# Patient Record
Sex: Female | Born: 1945 | Race: Black or African American | Hispanic: No | Marital: Married | Smoking: Never smoker
Health system: Southern US, Community
[De-identification: ages and names within clinical notes are randomized; demographics above are authoritative.]

## PROBLEM LIST (undated history)

## (undated) DIAGNOSIS — J849 Interstitial pulmonary disease, unspecified: Secondary | ICD-10-CM

## (undated) DIAGNOSIS — I7300 Raynaud's syndrome without gangrene: Secondary | ICD-10-CM

## (undated) DIAGNOSIS — J189 Pneumonia, unspecified organism: Secondary | ICD-10-CM

## (undated) DIAGNOSIS — I739 Peripheral vascular disease, unspecified: Secondary | ICD-10-CM

## (undated) DIAGNOSIS — I10 Essential (primary) hypertension: Secondary | ICD-10-CM

## (undated) DIAGNOSIS — M359 Systemic involvement of connective tissue, unspecified: Secondary | ICD-10-CM

## (undated) DIAGNOSIS — E041 Nontoxic single thyroid nodule: Secondary | ICD-10-CM

## (undated) DIAGNOSIS — M069 Rheumatoid arthritis, unspecified: Secondary | ICD-10-CM

## (undated) DIAGNOSIS — C50919 Malignant neoplasm of unspecified site of unspecified female breast: Secondary | ICD-10-CM

## (undated) DIAGNOSIS — L509 Urticaria, unspecified: Secondary | ICD-10-CM

## (undated) HISTORY — PX: APPENDECTOMY: SHX54

## (undated) HISTORY — PX: BREAST SURGERY: SHX581

## (undated) HISTORY — PX: HERNIA REPAIR: SHX51

## (undated) HISTORY — PX: OTHER SURGICAL HISTORY: SHX169

---

## 1997-10-17 ENCOUNTER — Other Ambulatory Visit: Admission: RE | Admit: 1997-10-17 | Discharge: 1997-10-17 | Payer: Self-pay | Admitting: Obstetrics and Gynecology

## 1997-12-18 ENCOUNTER — Ambulatory Visit (HOSPITAL_COMMUNITY): Admission: RE | Admit: 1997-12-18 | Discharge: 1997-12-18 | Payer: Self-pay | Admitting: Obstetrics and Gynecology

## 1998-01-27 ENCOUNTER — Other Ambulatory Visit: Admission: RE | Admit: 1998-01-27 | Discharge: 1998-01-27 | Payer: Self-pay | Admitting: Obstetrics and Gynecology

## 1998-05-08 ENCOUNTER — Other Ambulatory Visit: Admission: RE | Admit: 1998-05-08 | Discharge: 1998-05-08 | Payer: Self-pay | Admitting: Obstetrics and Gynecology

## 1999-02-24 ENCOUNTER — Other Ambulatory Visit: Admission: RE | Admit: 1999-02-24 | Discharge: 1999-02-24 | Payer: Self-pay | Admitting: Obstetrics and Gynecology

## 1999-03-01 ENCOUNTER — Ambulatory Visit (HOSPITAL_COMMUNITY): Admission: RE | Admit: 1999-03-01 | Discharge: 1999-03-01 | Payer: Self-pay | Admitting: Obstetrics and Gynecology

## 1999-03-01 ENCOUNTER — Encounter: Payer: Self-pay | Admitting: Obstetrics and Gynecology

## 2000-03-16 ENCOUNTER — Ambulatory Visit (HOSPITAL_COMMUNITY): Admission: RE | Admit: 2000-03-16 | Discharge: 2000-03-16 | Payer: Self-pay | Admitting: Obstetrics and Gynecology

## 2000-03-16 ENCOUNTER — Encounter: Payer: Self-pay | Admitting: Obstetrics and Gynecology

## 2000-05-17 ENCOUNTER — Other Ambulatory Visit: Admission: RE | Admit: 2000-05-17 | Discharge: 2000-05-17 | Payer: Self-pay | Admitting: Obstetrics and Gynecology

## 2001-04-30 ENCOUNTER — Encounter: Payer: Self-pay | Admitting: Obstetrics and Gynecology

## 2001-04-30 ENCOUNTER — Ambulatory Visit (HOSPITAL_COMMUNITY): Admission: RE | Admit: 2001-04-30 | Discharge: 2001-04-30 | Payer: Self-pay | Admitting: Obstetrics and Gynecology

## 2001-12-12 ENCOUNTER — Other Ambulatory Visit: Admission: RE | Admit: 2001-12-12 | Discharge: 2001-12-12 | Payer: Self-pay | Admitting: Obstetrics and Gynecology

## 2002-12-16 ENCOUNTER — Other Ambulatory Visit: Admission: RE | Admit: 2002-12-16 | Discharge: 2002-12-16 | Payer: Self-pay | Admitting: Obstetrics and Gynecology

## 2006-10-02 ENCOUNTER — Ambulatory Visit: Payer: Self-pay | Admitting: Gastroenterology

## 2011-07-04 ENCOUNTER — Encounter: Payer: Self-pay | Admitting: Rheumatology

## 2011-07-27 ENCOUNTER — Encounter: Payer: Self-pay | Admitting: Rheumatology

## 2013-02-01 ENCOUNTER — Ambulatory Visit: Payer: Self-pay | Admitting: Gastroenterology

## 2013-07-01 ENCOUNTER — Ambulatory Visit: Payer: Self-pay | Admitting: Rheumatology

## 2013-07-17 ENCOUNTER — Ambulatory Visit: Payer: Self-pay | Admitting: Rheumatology

## 2014-07-28 ENCOUNTER — Ambulatory Visit: Payer: Medicare Other | Attending: Rheumatology | Admitting: Occupational Therapy

## 2014-07-28 DIAGNOSIS — M069 Rheumatoid arthritis, unspecified: Secondary | ICD-10-CM | POA: Diagnosis not present

## 2014-07-28 DIAGNOSIS — M79641 Pain in right hand: Secondary | ICD-10-CM | POA: Insufficient documentation

## 2014-07-28 DIAGNOSIS — M79642 Pain in left hand: Secondary | ICD-10-CM | POA: Insufficient documentation

## 2014-07-28 NOTE — Therapy (Signed)
[facility] [address] [place], [address] Phone: [phone]   Fax:  [phone]  Occupational Therapy Evaluation  Patient Details  Name: Eileen Maldonado MRN: [id number] Date of Birth: 13-Apr-1945 Referring Provider:  [name].,*  Encounter Date: 07/28/2014      OT End of Session - 07/28/14 1407    Visit Number 1   Number of Visits 4   Date for OT Re-Evaluation 08/25/14   OT Start Time 1045   OT Stop Time 1155   OT Time Calculation (min) 70 min   Activity Tolerance Patient tolerated treatment well      No past medical history on file.  No past surgical history on file.  There were no vitals filed for this visit.  Visit Diagnosis:  Rheumatoid arthritis - Plan: Ot plan of care cert/re-cert  Pain of right hand - Plan: Ot plan of care cert/re-cert  Pain in joint, hand, left - Plan: Ot plan of care cert/re-cert      Subjective Assessment - 07/28/14 1059    Subjective  Pt is referred to out-pt hand therapy today by Dr [name]. [name] for Eval and treatment. She reports that she has noted increased UD bilateral hands over last several years. Pt was seen by Dr [name] in March 2016. - had pain, dropping objects , L hand worse than R ,  and thumb bends backwards - pain in thumbs with gripping    Patient Stated Goals Increase strength in both hands, and limit further deviation as able. Pt also states that she would like to have a brace for her hands.   Currently in Pain? No/denies  Pt reports weakness but not pain at this time. Pt had pain prior to taking Prednisone   Multiple Pain Sites No           [facility] OT Assessment - 07/28/14 0001    Assessment   Diagnosis --  Rheumatoid Arthritis bilateral hands   Onset Date --  25 yrs per pt report   Assessment --  Pt reads, does housekeeping.    Home  Environment   Additional Comments --  Pt husband opening jars, picking up heavy objects.    Lives With Spouse  Husband of 50 years   ADL   Grooming Modified independent   Grooming Patient Percentage --  Pt reports difficulty w/ buttons, grasping, fine motor/coord   Right Hand AROM   R Thumb MCP 0-60 --  CMC -65-70*   R Thumb IP 0-80 --  hyp exten of 45 @ IP; 50   R Thumb Radial ABduction/ADduction 0-55 42   R Thumb Palmar ABduction/ADduction 0-45 48   R Index  MCP 0-90 95 Degrees   R Index PIP 0-100 100 Degrees   R Long  MCP 0-90 95 Degrees   R Long PIP 0-100 100 Degrees   R Ring  MCP 0-90 100 Degrees   R Ring PIP 0-100 100 Degrees   R Little  MCP 0-90 100 Degrees   R Little PIP 0-100 100 Degrees   Left Hand AROM   L Thumb MCP 0-60 --  CMC -50-80   L Thumb IP 0-80 --  IP hyp exten 50* and 50 flexion   L Thumb Radial ADduction/ABduction 0-55 40   L Thumb Palmar ADduction/ABduction 0-45 42   L Index  MCP 0-90 90 Degrees   L Index PIP 0-100 100 Degrees   L Long  MCP  0-90 90 Degrees   L Long PIP 0-100 100 Degrees   L Ring  MCP 0-90 95 Degrees   L Ring PIP 0-100 100 Degrees   L Little  MCP 0-90 95 Degrees   L Little PIP 0-100 85 Degrees   Hand Function   Right Hand Grip (lbs) --  35#   Right Hand Lateral Pinch --  12#   Right Hand 3 Point Pinch --  10#   Left Hand Grip (lbs) --  30#   Left Hand Lateral Pinch --  10#   Left 3 point pinch --  7#   Comment --  Wrist AROM WFL's bilaterally.         ssued Left hand neoprene CMC thumb splint for use at home with functional activity. Pt was educated in splint use, care and precautions and verbalized understanding in clinic today.  Issued handout and reviewed with pt; Joint Protection Techniques. Added techniques to home program and pt verbalized understanding in clinic today. Pt was educated to review handout at home and ask questions at next visit.  Issued handout on a/e for use during ADL's, ie: button hook, nail clipper, jar opener, spring loaded scissors, wide grip pen etc to assist in maximizing  independence for ADL's and daily activity.  Pt was also instructed in HEP for AROM: Tendon AROM lumbricals, hook fisting/intrinsic only right hand (not left). L hand intrinsic fist blocked   Radial deviation, tapping, opposition with flexion of thumb IP  pt to perform 5-8 reps each exercise.                  OT Education - 07/28/14 1406    Education provided Yes   Person(s) Educated Patient   Methods Explanation;Demonstration;Handout   Comprehension Returned demonstration;Verbalized understanding          OT Short Term Goals - 07/28/14 1414    OT SHORT TERM GOAL #1   Title Pt. will be indep. with HEP to increase/maintain bilateral thumb AROM , digits extention and flexion    Baseline Thumb IP hyper extention , MP kept in flexion - 5th digits decrease flexion at MP and PIP's    Time 2   Period Weeks   Status New   OT SHORT TERM GOAL #2   Title Pt. will have improvement with pain by 10-15 points and function by 10 points on PRWHE    Baseline Pain 35/50; function 32/50   Time 4   Period Weeks   Status New   OT SHORT TERM GOAL #3   Title Pt. will be indep. with wearing appropriate splints  to decrease  pain and increase use of thumb's/ decrease ulnar deviation    Baseline no splints to decrease ulnar deviation and decrease pain at thumbs    Time 3   Period Weeks   Status New           OT Long Term Goals - 07/28/14 1419    OT LONG TERM GOAL #1   Title Pt to verbalize 3 joint protection principles to use at home and 3 adaptive equipment to increase ease at home and decrease pain    Baseline no knowledge on AE   Time 4   Period Weeks   Status New               Plan - 07/28/14 1409    Clinical Impression Statement Pt present with diagnosis of RA in bilateral hands, increase ulnar deviation of digiits, hyper extention  of PIP's and flexion of DIP l decrease hyper extention of MC' , increase hyper extention of IP thumb and flexion of MP - increase pain  with funtional use , decrease grip ad prehension strenght - L more than R -   pt in ned for splinting , education of HEP , jont protection and  modifications    Pt will benefit from skilled therapeutic intervention in order to improve on the following deficits (Retired) Decreased knowledge of use of DME;Impaired flexibility;Decreased strength;Impaired UE functional use;Other (comment)   Rehab Potential Good   OT Frequency 1x / week   OT Duration 4 weeks   OT Treatment/Interventions Parrafin;Therapeutic exercise;DME and/or AE instruction;Splinting;Other (comment)   Plan joint protection principles   OT Home Exercise Plan splinting - pt to bring in old splint or picture she seen prior to me ordeing anti ulnar deviation splint           G-Codes - Aug 27, 2014 1422    Functional Assessment Tool Used Grip , prehension strenght, ROM , pain and clinical judgement   Functional Limitation Self care   Self Care Current Status (U5750) At least 20 percent but less than 40 percent impaired, limited or restricted   Self Care Goal Status (N1833) At least 1 percent but less than 20 percent impaired, limited or restricted      Problem List Patient Active Problem List   Diagnosis Date Noted  . Rheumatoid arthritis 08/27/14    Rosalyn Gess 08/27/14, 2:37 PM  [facility] [address], [place], [address] Phone: [phone]   Fax:  [phone]

## 2014-07-28 NOTE — Patient Instructions (Addendum)
Outcome Measure: Patient Rated Wrist/Hand Evaluation Pain= 35/50     Function= 32/50  Issued Left hand neoprene CMC thumb splint for use at home with functional activity. Pt was educated in splint use, care and precautions and verbalized understanding in clinic today.  Issued handout and reviewed with pt; Joint Protection Techniques. Added techniques to home program and pt verbalized understanding in clinic today. Pt was educated to review handout at home and ask questions at next visit.  Issued handout on a/e for use during ADL's, ie: button hook, nail clipper, jar opener, spring loaded scissors, wide grip pen etc to assist in maximizing independence for ADL's and daily activity.  Pt was also instructed in HEP for AROM: Tendon AROM lumbricals, hook fisting/intrinsic only right hand (not left). L hand intrinsic fist blocked   Radial deviation, tapping, opposition with flexion of thumb IP  pt to perform 5-8 reps each exercise.

## 2014-07-30 ENCOUNTER — Encounter: Payer: Self-pay | Admitting: Occupational Therapy

## 2014-08-04 ENCOUNTER — Ambulatory Visit: Payer: Medicare Other | Admitting: Occupational Therapy

## 2014-08-04 ENCOUNTER — Encounter: Payer: Self-pay | Admitting: Occupational Therapy

## 2014-08-04 DIAGNOSIS — M069 Rheumatoid arthritis, unspecified: Secondary | ICD-10-CM

## 2014-08-04 DIAGNOSIS — M79641 Pain in right hand: Secondary | ICD-10-CM

## 2014-08-04 DIAGNOSIS — M79642 Pain in left hand: Secondary | ICD-10-CM

## 2014-08-04 NOTE — Therapy (Signed)
[facility] [address] [place], [address] Phone: [phone]   Fax:  [phone]  Occupational Therapy Treatment  Patient Details  Name: Eileen Maldonado MRN: [id number] Date of Birth: May 25, 1945 Referring Provider:  [place] Pink, MD  Encounter Date: 08/04/2014      OT End of Session - 08/04/14 1354    Visit Number 2   Number of Visits 4   Date for OT Re-Evaluation 08/25/14   OT Start Time 1308   OT Stop Time 1350   OT Time Calculation (min) 42 min      No past medical history on file.  No past surgical history on file.  There were no vitals filed for this visit.  Visit Diagnosis:  Rheumatoid arthritis  Pain of right hand  Pain in joint, hand, left      Subjective Assessment - 08/04/14 1349    Subjective  Doing okay - just bad day - my power is out and all my papers and splint in house and I cannot get in house - no pain just still  need to help my pinkie on L and fingers on R to go straight after fist    Patient Stated Goals Increase strength in both hands, and limit further deviation as able. Pt also states that she would like to have a brace for her hands.   Currently in Pain? No/denies                      OT Treatments/Exercises (OP) - 08/04/14 0001    Exercises   Exercises Hand   Hand Exercises   Other Hand Exercises Tapping of digiits on bilateal hands but was hyper extendeing out of PIP's - fitted with Oval 8 splints on wnd thru 4th's to correct, also RD of digits done - use paper to slide under each , IP thumb blocked AROM    Splinting   Splinting Assess size of Alimed anti ulnar deviation splints for bilateral hands - measure meduim size - Oval 's 8 fitted on 2nd thur 4th digiits - size 7 and size 8 on R wnd and 3rd                 OT Education - 08/04/14 1354    Education provided Yes   Education Details see pt instruction   Person(s) Educated  Patient   Methods Explanation;Demonstration;Verbal cues;Handout   Comprehension Verbalized understanding;Verbal cues required          OT Short Term Goals - 07/28/14 1414    OT SHORT TERM GOAL #1   Title Pt. will be indep. with HEP to increase/maintain bilateral thumb AROM , digits extention and flexion    Baseline Thumb IP hyper extention , MP kept in flexion - 5th digits decrease flexion at MP and PIP's    Time 2   Period Weeks   Status New   OT SHORT TERM GOAL #2   Title Pt. will have improvement with pain by 10-15 points and function by 10 points on PRWHE    Baseline Pain 35/50; function 32/50   Time 4   Period Weeks   Status New   OT SHORT TERM GOAL #3   Title Pt. will be indep. with wearing appropriate splints  to decrease  pain and increase use of thumb's/ decrease ulnar deviation    Baseline no splints to decrease ulnar deviation and decrease pain at thumbs  Time 3   Period Weeks   Status New           OT Long Term Goals - 07/28/14 1419    OT LONG TERM GOAL #1   Title Pt to verbalize 3 joint protection principles to use at home and 3 adaptive equipment to increase ease at home and decrease pain    Baseline no knowledge on AE   Time 4   Period Weeks   Status New               Plan - 08/04/14 1355    Clinical Impression Statement Pt MC ulnar deviastion limiting her extention of MC - out of fist - pt to only do extention and RD drom midline - and use oval 8 splnts to decrease hyper extenion of PIP's of 2nd thur 4th - CMC neoprene splint fitted for R hand - pt report increase support on the L with neopren splint    Pt will benefit from skilled therapeutic intervention in order to improve on the following deficits (Retired) Decreased knowledge of use of DME;Impaired flexibility;Decreased strength;Impaired UE functional use;Other (comment)   Rehab Potential Good   OT Frequency 1x / week   OT Duration 4 weeks   OT Treatment/Interventions Parrafin;Therapeutic  exercise;DME and/or AE instruction;Splinting;Other (comment)   OT Home Exercise Plan see pt instruction    Consulted and Agree with Plan of Care Patient        Problem List Patient Active Problem List   Diagnosis Date Noted  . Rheumatoid arthritis 07/28/2014    Rosalyn Gess OTR/L, CLT 08/04/2014, 1:59 PM  [facility] [address] [place], [address] Phone: [phone]   Fax:  [phone]

## 2014-08-04 NOTE — Patient Instructions (Signed)
Pt instructed on OVal 8 splint use during tapping /extnetion of digits but they need to be alinged straigth   RD of all digits - slide on paper - from midline into RD   Thumb IP flexion blocked

## 2014-08-07 ENCOUNTER — Encounter: Payer: Self-pay | Admitting: Occupational Therapy

## 2014-08-07 ENCOUNTER — Encounter: Payer: Medicare Other | Admitting: Occupational Therapy

## 2014-08-11 ENCOUNTER — Encounter: Payer: Self-pay | Admitting: Occupational Therapy

## 2014-08-13 ENCOUNTER — Encounter: Payer: Self-pay | Admitting: Occupational Therapy

## 2014-08-14 ENCOUNTER — Encounter: Admitting: Occupational Therapy

## 2014-08-18 ENCOUNTER — Encounter: Payer: Self-pay | Admitting: Occupational Therapy

## 2014-08-19 ENCOUNTER — Ambulatory Visit: Payer: Medicare Other | Admitting: Occupational Therapy

## 2014-08-19 DIAGNOSIS — M79641 Pain in right hand: Secondary | ICD-10-CM | POA: Diagnosis not present

## 2014-08-19 DIAGNOSIS — M79642 Pain in left hand: Secondary | ICD-10-CM

## 2014-08-19 DIAGNOSIS — M069 Rheumatoid arthritis, unspecified: Secondary | ICD-10-CM

## 2014-08-19 NOTE — Therapy (Signed)
[facility] [address], [place], [address] Phone: [phone]   Fax:  [phone]  Occupational Therapy Treatment  Patient Details  Name: Eileen Maldonado MRN: [id number] Date of Birth: [birth year] Referring Provider:  [name].,*  Encounter Date: 08/19/2014      OT End of Session - 08/19/14 1559    Visit Number 3   Number of Visits 4   Date for OT Re-Evaluation 08/25/14   OT Start Time 1326   OT Stop Time 1354   OT Time Calculation (min) 28 min   Activity Tolerance Patient tolerated treatment well   Behavior During Therapy [facility] for tasks assessed/performed      No past medical history on file.  No past surgical history on file.  There were no vitals filed for this visit.  Visit Diagnosis:  Rheumatoid arthritis  Pain of right hand  Pain in joint, hand, left      Subjective Assessment - 08/19/14 1549    Subjective  Doing okay - just bad day - my power is out and all my papers and splint in house and I cannot get in house - no pain just still  need to help my pinkie on L and fingers on R to go straight after fist    Patient Stated Goals Increase strength in both hands, and limit further deviation as able. Pt also states that she would like to have a brace for her hands.   Currently in Pain? No/denies                      OT Treatments/Exercises (OP) - 08/19/14 0001    Hand Exercises   Other Hand Exercises Assess fisitng and opening of digits in splint - R digits  - but L hand deviation is 60 degrees out of 5th digits and extention of MC -60 degrees - with splint on deviation decrease to 28 degrees and extention to about -30 degrees - able to hold full extention with place and hold    Splinting   Splinting Pt was fitted with bilateral  Hinged ulnar deviation splints - R hand fitted very well and very litte discomfort and able to open hand - but L hand needed some  adjustements to loops under digits and fastening wrist strap correctly from thumb to ulnar side - mold skin applied on bar on dorsal proximal phalanges                  OT Education - 08/19/14 1558    Education provided Yes   Education Details see pt instruction    Person(s) Educated Patient   Methods Explanation;Demonstration;Tactile cues   Comprehension Verbalized understanding;Returned demonstration;Verbal cues required          OT Short Term Goals - 08/19/14 1602    OT SHORT TERM GOAL #1   Title Pt. will be indep. with HEP to increase/maintain bilateral thumb AROM , digits extention and flexion    Baseline Thumb IP hyper extention , MP kept in flexion - 5th digits decrease flexion at MP and PIP's    Time 2   Period Weeks   Status On-going   OT SHORT TERM GOAL #2   Title Pt. will have improvement with pain by 10-15 points and function by 10 points on PRWHE    Baseline Pain 35/50; function 32/50   Time 2   Period Weeks   Status On-going  OT SHORT TERM GOAL #3   Title Pt. will be indep. with wearing appropriate splints  to decrease  pain and increase use of thumb's/ decrease ulnar deviation    Time 3   Period Weeks   Status On-going           OT Long Term Goals - 08/19/14 1602    OT LONG TERM GOAL #1   Title Pt to verbalize 3 joint protection principles to use at home and 3 adaptive equipment to increase ease at home and decrease pain    Status Achieved               Plan - 08/19/14 1600    Clinical Impression Statement Pt's ulnar deviation and extention of MC's worse on the L hand - pt do had some irritation between webspaces - pt to slowly increase wearing of splints - no issues with R hand but need to increase gradually on the L - adjusted it several times and assess  deviation that decrease after adjustment - not  normal but did improve it about 50%   Pt will benefit from skilled therapeutic intervention in order to improve on the following  deficits (Retired) Decreased knowledge of use of DME;Impaired flexibility;Decreased strength;Impaired UE functional use;Other (comment)   Rehab Potential Good   OT Frequency 1x / week   OT Duration 2 weeks   OT Treatment/Interventions Parrafin;Therapeutic exercise;DME and/or AE instruction;Splinting;Other (comment)   OT Home Exercise Plan see pt instruction    Consulted and Agree with Plan of Care Patient        Problem List Patient Active Problem List   Diagnosis Date Noted  . Rheumatoid arthritis 07/28/2014    Rosalyn Gess  OTR/L, CLT 08/19/2014, 4:04 PM  [facility] [address] [place], [address] Phone: [phone]   Fax:  [phone]

## 2014-08-19 NOTE — Patient Instructions (Signed)
Hinged ulnar deviation splint - to start with 30 min and increase if no issues to 45 min, then hour and so on  BUT if issues keep it at 30 min - 45 min   And R hand can maybe increase faster and L hand maybe need to stay about 30 to 45 min - because worse and relying more on splint for correction  To wear CMC neoprene splint with activities that will increase thumb pain - to prevent pain

## 2014-08-21 ENCOUNTER — Encounter: Payer: Self-pay | Admitting: Occupational Therapy

## 2014-08-21 ENCOUNTER — Encounter: Admitting: Occupational Therapy

## 2014-08-28 ENCOUNTER — Ambulatory Visit: Admitting: Occupational Therapy

## 2014-09-03 ENCOUNTER — Inpatient Hospital Stay: Payer: Medicare Other | Admitting: Oncology

## 2014-09-03 ENCOUNTER — Ambulatory Visit: Payer: Medicare Other | Attending: Rheumatology | Admitting: Occupational Therapy

## 2014-09-03 DIAGNOSIS — M79641 Pain in right hand: Secondary | ICD-10-CM | POA: Insufficient documentation

## 2014-09-03 DIAGNOSIS — M79642 Pain in left hand: Secondary | ICD-10-CM | POA: Diagnosis present

## 2014-09-03 DIAGNOSIS — M069 Rheumatoid arthritis, unspecified: Secondary | ICD-10-CM | POA: Insufficient documentation

## 2014-09-03 NOTE — Patient Instructions (Signed)
Pt educated on wearing of 2 types of anti ulnar deviation splints for daytime and night time use   Precautions address and pt in agreement - can get for night time soft one strap to achor it around the wrist if sliding off

## 2014-09-03 NOTE — Therapy (Signed)
[facility] [address], [place], [address] Phone: [phone]   Fax:  [phone]  Occupational Therapy Treatment  Patient Details  Name: Eileen Maldonado MRN: [id number] Date of Birth: 12-May-1945 Referring Provider:  [name].,*  Encounter Date: 09/03/2014      OT End of Session - 09/03/14 1405    Visit Number 4   Number of Visits 6   Date for OT Re-Evaluation 09/17/14   OT Start Time 1312   OT Stop Time 1345   OT Time Calculation (min) 33 min   Activity Tolerance Patient tolerated treatment well   Behavior During Therapy [facility] for tasks assessed/performed      No past medical history on file.  No past surgical history on file.  There were no vitals filed for this visit.  Visit Diagnosis:  Rheumatoid arthritis - Plan: OT PLAN OF CARE CERT/RE-CERT  Pain of right hand - Plan: OT PLAN OF CARE CERT/RE-CERT  Pain in joint, hand, left - Plan: OT PLAN OF CARE CERT/RE-CERT      Subjective Assessment - 09/03/14 1313    Subjective  I like the black splints for the thumb - pain feels better - I could not figure out what was L and R for those deviation splints - I woke up this am and my L thumb was out to the sdie - I brought the soft old splints for you to check please    Patient Stated Goals Increase strength in both hands, and limit further deviation as able. Pt also states that she would like to have a brace for her hands.   Currently in Pain? Yes   Pain Score 3    Pain Location Hand   Pain Orientation Right;Left   Pain Descriptors / Indicators Aching   Pain Type Chronic pain   Pain Onset Other (comment)   Pain Frequency Intermittent                      OT Treatments/Exercises (OP) - 09/03/14 0001    Hand Exercises   Other Hand Exercises pt UD is with daytime antideviation splints decrease more than 50% ; pt is unable to extend without help 3rd thru 5th L digits at  [facility] - but can maintain it -    Splinting   Splinting Assess Alimed anti ulnar splints this date - and marked L and R for pt - to wear about 45 min during day at time - then assess her old soft anti ulnar deviation splints - she needed fitting and education of how to donn and wear them correctly - she was puttying on the light and straps for each digits was wrong place and over each other L and R - CMC thumb neoprene splints great fit and use - decreaes pain at thumbs per pt during daytime use                 OT Education - 09/03/14 1405    Education provided Yes   Education Details pt instruction   Person(s) Educated Patient   Methods Explanation;Demonstration;Tactile cues;Verbal cues   Comprehension Verbalized understanding;Returned demonstration;Verbal cues required          OT Short Term Goals - 09/03/14 1339    OT SHORT TERM GOAL #1   Title Pt. will be indep. with HEP to increase/maintain bilateral thumb AROM , digits extention and flexion    Time 2  Status Achieved   OT SHORT TERM GOAL #2   Title Pt. will have improvement with pain by 10-15 points and function by 10 points on PRWHE    Status Not Met   OT SHORT TERM GOAL #3   Title Pt. will be indep. with wearing appropriate splints  to decrease  pain and increase use of thumb's/ decrease ulnar deviation    Time 2   Period Weeks   Status On-going           OT Long Term Goals - 09/03/14 1359    OT LONG TERM GOAL #1   Title Pt to verbalize 3 joint protection principles to use at home and 3 adaptive equipment to increase ease at home and decrease pain    Status Achieved               Plan - 09/03/14 1407    Clinical Impression Statement Pt was donning and wearing soft ulnar deviation splints wrong in past - was educated and fitted this date to use at night time - and then reviewed daytime ones with hinges - pt demo correctly with min vc - she do request that she ones to come back one more time to make sure  she is weearing it corretly - explain for pt  splints will not make L hand better it is about preventing it from getting worse in L and  R hand   Pt will benefit from skilled therapeutic intervention in order to improve on the following deficits (Retired) Decreased knowledge of use of DME;Impaired flexibility;Decreased strength;Impaired UE functional use;Other (comment)   Rehab Potential Good   OT Frequency 1x / week   OT Duration 2 weeks   OT Treatment/Interventions Parrafin;Therapeutic exercise;DME and/or AE instruction;Splinting;Other (comment)   OT Home Exercise Plan see pt instruction    Consulted and Agree with Plan of Care Patient        Problem List Patient Active Problem List   Diagnosis Date Noted  . Rheumatoid arthritis 07/28/2014    Rosalyn Gess OTR/L,CLT 09/03/2014, 2:12 PM  [facility] [address], [place], [address] Phone: [phone]   Fax:  [phone]

## 2014-09-10 ENCOUNTER — Ambulatory Visit: Payer: Medicare Other | Admitting: Occupational Therapy

## 2014-09-10 DIAGNOSIS — M069 Rheumatoid arthritis, unspecified: Secondary | ICD-10-CM | POA: Diagnosis not present

## 2014-09-10 DIAGNOSIS — M79642 Pain in left hand: Secondary | ICD-10-CM

## 2014-09-10 DIAGNOSIS — M79641 Pain in right hand: Secondary | ICD-10-CM

## 2014-09-10 NOTE — Patient Instructions (Signed)
Reviewed again donning and doffing as well as wearing of splints

## 2014-09-10 NOTE — Therapy (Signed)
[facility] [address] [place], [address] Phone: [phone]   Fax:  [phone]  Occupational Therapy Treatment  Patient Details  Name: Eileen Maldonado MRN: [id number] Date of Birth: 1945/10/04 Referring Provider:  [name].,*  Encounter Date: 09/10/2014      OT End of Session - 09/10/14 1719    Visit Number 5   Number of Visits 6   Date for OT Re-Evaluation 09/17/14   OT Start Time 1500   OT Stop Time 1533   OT Time Calculation (min) 33 min   Activity Tolerance Patient tolerated treatment well   Behavior During Therapy [facility] for tasks assessed/performed      No past medical history on file.  No past surgical history on file.  There were no vitals filed for this visit.  Visit Diagnosis:  Rheumatoid arthritis  Pain of right hand  Pain in joint, hand, left      Subjective Assessment - 09/10/14 1710    Subjective  I wear the soft splints in night time , hard ones during day for short periods of time - thumb ones when using my hands a lot -I hope the the new medicine get approve to help for the pain and swelling over my knuckles   Patient Stated Goals Increase strength in both hands, and limit further deviation as able. Pt also states that she would like to have a brace for her hands.   Currently in Pain? Yes   Pain Score 3    Pain Location Hand   Pain Orientation Right;Left   Pain Descriptors / Indicators Aching   Pain Type Chronic pain   Pain Onset Other (comment)            [facility] OT Assessment - 09/10/14 0001    Strength   Right Hand Grip (lbs) 25   Right Hand Lateral Pinch 13 lbs   Right Hand 3 Point Pinch 10 lbs   Left Hand Grip (lbs) 26   Left Hand Lateral Pinch 10 lbs   Left Hand 3 Point Pinch 7 lbs                  OT Treatments/Exercises (OP) - 09/10/14 0001    Hand Exercises   Other Hand Exercises RD of digits , fisting - but pt to do place  and hold, and able to correct into RD from UD    Splinting   Splinting Pt able to donn [place] anti ulnar splints on this date , and soft ones needed min A - Neoprene [facility] splints Ind                OT Education - 09/10/14 1719    Education provided Yes   Education Details splints   Person(s) Educated Patient   Methods Explanation;Demonstration;Tactile cues   Comprehension Verbalized understanding;Returned demonstration;Verbal cues required          OT Short Term Goals - 09/10/14 1720    OT SHORT TERM GOAL #1   Title Pt. will be indep. with HEP to increase/maintain bilateral thumb AROM , digits extention and flexion    Status Achieved   OT SHORT TERM GOAL #2   Title Pt. will have improvement with pain by 10-15 points and function by 10 points on PRWHE    Status Not Met   OT SHORT TERM GOAL #3   Title Pt. will be indep. with wearing appropriate splints  to decrease  pain and increase use of thumb's/ decrease ulnar deviation    Status Achieved           OT Long Term Goals - 09-Oct-2014 1721    OT LONG TERM GOAL #1   Title Pt to verbalize 3 joint protection principles to use at home and 3 adaptive equipment to increase ease at home and decrease pain    Status Achieved               Plan - Oct 09, 2014 1721    Clinical Impression Statement Pt maintain grip 25 and 26 lbs bilateral - and prehension still 7-10 lbs - pt fitted with anti ulnar deviation splints to wear and prevent R hand getting worse and L hand maintain ROM and function - pt independent in HEP and wearing of splints as well as joint protection prinscipleas    Pt will benefit from skilled therapeutic intervention in order to improve on the following deficits (Retired) Decreased knowledge of use of DME;Impaired flexibility;Decreased strength;Impaired UE functional use;Other (comment)   Rehab Potential Good   OT Treatment/Interventions [place];Therapeutic exercise;DME and/or AE instruction;Splinting;Other  (comment)   OT Home Exercise Plan splints wearing    Consulted and Agree with Plan of Care Patient          G-Codes - 2014-10-09 1724    Functional Assessment Tool Used Grip and prehenison , ROM - splint use    Functional Limitation Self care   Self Care Current Status (R5615) At least 1 percent but less than 20 percent impaired, limited or restricted   Self Care Goal Status (P7943) At least 1 percent but less than 20 percent impaired, limited or restricted      Problem List Patient Active Problem List   Diagnosis Date Noted  . Rheumatoid arthritis 07/28/2014    Rosalyn Gess OTR/L, CLT 10-09-2014, 5:26 PM  [facility] [address], [place], [address] Phone: [phone]   Fax:  [phone]

## 2015-07-09 ENCOUNTER — Ambulatory Visit: Payer: Medicare Other | Attending: Rheumatology | Admitting: Occupational Therapy

## 2015-07-09 DIAGNOSIS — M25642 Stiffness of left hand, not elsewhere classified: Secondary | ICD-10-CM | POA: Diagnosis present

## 2015-07-09 DIAGNOSIS — M25641 Stiffness of right hand, not elsewhere classified: Secondary | ICD-10-CM | POA: Insufficient documentation

## 2015-07-09 NOTE — Therapy (Signed)
[facility] [address] [place], [address] Phone: [phone]   Fax:  [phone]  Occupational Therapy Treatment  Patient Details  Name: Eileen Maldonado MRN: [id number] Date of Birth: 1945/11/23 Referring Provider: [name]  Encounter Date: 07/09/2015      OT End of Session - 07/09/15 1626    Visit Number 1   Number of Visits 1   Date for OT Re-Evaluation 07/09/15   OT Start Time 1301   OT Stop Time 1357   OT Time Calculation (min) 56 min   Activity Tolerance Patient tolerated treatment well   Behavior During Therapy [facility] for tasks assessed/performed      No past medical history on file.  No past surgical history on file.  There were no vitals filed for this visit.      Subjective Assessment - 07/09/15 1310    Subjective  Wanted to come and see you how my hands doing - compare to last year ago - I started during winter compression gloves during day and that is helping - still splints    Patient Stated Goals Want you to compare to last year my strength and ROM    Currently in Pain? No/denies            [facility] OT Assessment - 07/09/15 0001    Assessment   Diagnosis Bilateral hand pain    Referring Provider kernodle   Onset Date 06/23/15   Assessment Pt was seen year ago - and wanted reassessment to compare if she did not loose any ROM or strength    Prior Therapy --  last year May /June    Balance Screen   Has the patient fallen in the past 6 months No   Has the patient had a decrease in activity level because of a fear of falling?  No   Is the patient reluctant to leave their home because of a fear of falling?  No   Home  Environment   Additional Comments --  Husband still pick up heavy objects or open jars    Lives With Spouse   Prior Function   Leisure House work , cooking, keeping grandson - 7 months (2 days ago) Read, word search , games on table    Strength   Right Hand  Grip (lbs) 40   Right Hand Lateral Pinch 16 lbs   Right Hand 3 Point Pinch 10 lbs   Left Hand Grip (lbs) 31   Left Hand Lateral Pinch 13 lbs   Left Hand 3 Point Pinch 10 lbs   Right Hand AROM   R Thumb MCP 0-60 65 Degrees   R Thumb IP 0-80 --  55 flexion , 50 rest hyper extention    R Index  MCP 0-90 90 Degrees   R Index PIP 0-100 100 Degrees   R Long  MCP 0-90 90 Degrees   R Long PIP 0-100 100 Degrees   R Ring  MCP 0-90 95 Degrees   R Ring PIP 0-100 100 Degrees   R Little  MCP 0-90 100 Degrees   R Little PIP 0-100 75 Degrees   Left Hand AROM   L Thumb MCP 0-60 --  76 flexion   L Thumb IP 0-80 --  50 flexion , hyperext 60    L Index  MCP 0-90 90 Degrees   L Index PIP 0-100 100 Degrees   L Long  MCP 0-90 95 Degrees  L Long PIP 0-100 100 Degrees   L Ring  MCP 0-90 90 Degrees   L Ring PIP 0-100 100 Degrees   L Little  MCP 0-90 95 Degrees   L Little PIP 0-100 90 Degrees                          OT Education - 07/09/15 1626    Education provided Yes   Education Details HEP    Person(s) Educated Patient   Methods Explanation;Demonstration;Tactile cues;Verbal cues   Comprehension Returned demonstration;Verbalized understanding          OT Short Term Goals - 09/10/14 1720    OT SHORT TERM GOAL #1   Title Pt. will be indep. with HEP to increase/maintain bilateral thumb AROM , digits extention and flexion    Status Achieved   OT SHORT TERM GOAL #2   Title Pt. will have improvement with pain by 10-15 points and function by 10 points on PRWHE    Status Not Met   OT SHORT TERM GOAL #3   Title Pt. will be indep. with wearing appropriate splints  to decrease  pain and increase use of thumb's/ decrease ulnar deviation    Status Achieved           OT Long Term Goals - 07/09/15 1631    OT LONG TERM GOAL #1   Title Pt verbalize understanding for homeprogram to cont with    Baseline met - able to demo   Status Achieved               Plan -  07/09/15 1627    Clinical Impression Statement Pt was refer by DR [name] - pt was seen by this OT year ago - and pt wanted reassessment and compare to last year - if she maintain her ROM and strength - pt AROM same cont to have some subluxation of tendons and digits ulnar deviat at rest but can correct them in full extentoin actively - pt has anti ulnar deviation splints to wear at home - but then her prehension strenght improve  with 3 lbs each except  3 point on the R - and then grip  improve from 25 to 40 lbs R, L improve 26 to 31 lbs - pt is now on Enbrel that she was not  on last year - pt to cont with home program     Plan cont with homeprogram    Consulted and Agree with Plan of Care Patient      Patient will benefit from skilled therapeutic intervention in order to improve the following deficits and impairments:     Visit Diagnosis: Stiffness of right hand, not elsewhere classified - Plan: Ot plan of care cert/re-cert  Stiffness of left hand, not elsewhere classified - Plan: Ot plan of care cert/re-cert    Problem List Patient Active Problem List   Diagnosis Date Noted  . Rheumatoid arthritis ([place]) 07/28/2014    Rosalyn Gess OTR/L,CLT  07/09/2015, 4:35 PM  [place] [facility] [address] [place], [address] Phone: [phone]   Fax:  [phone]  Name: Eileen Maldonado MRN: [id number] Date of Birth: October 18, 1945

## 2015-07-09 NOTE — Patient Instructions (Signed)
Pt cont with same HEP that she did last year- RD of digits  tapping into extention , opposition   And then Joint protection principles and AE trng

## 2016-01-14 ENCOUNTER — Other Ambulatory Visit
Admission: RE | Admit: 2016-01-14 | Discharge: 2016-01-14 | Disposition: A | Discharge status: Home / Self Care | Payer: Medicare Other | Source: Ambulatory Visit | Attending: Gastroenterology | Admitting: Gastroenterology

## 2016-01-14 DIAGNOSIS — R197 Diarrhea, unspecified: Secondary | ICD-10-CM | POA: Insufficient documentation

## 2016-01-15 LAB — GASTROINTESTINAL PANEL BY PCR, STOOL (REPLACES STOOL CULTURE)
Adenovirus F40/41: NOT DETECTED
Astrovirus: NOT DETECTED
CRYPTOSPORIDIUM: NOT DETECTED
Campylobacter species: NOT DETECTED
Cyclospora cayetanensis: NOT DETECTED
Entamoeba histolytica: NOT DETECTED
Enteroaggregative E coli (EAEC): DETECTED — AB
Enteropathogenic E coli (EPEC): NOT DETECTED
Enterotoxigenic E coli (ETEC): NOT DETECTED
Giardia lamblia: NOT DETECTED
NOROVIRUS GI/GII: NOT DETECTED
Plesimonas shigelloides: NOT DETECTED
ROTAVIRUS A: NOT DETECTED
SALMONELLA SPECIES: NOT DETECTED
SAPOVIRUS (I, II, IV, AND V): NOT DETECTED
SHIGA LIKE TOXIN PRODUCING E COLI (STEC): NOT DETECTED
SHIGELLA/ENTEROINVASIVE E COLI (EIEC): NOT DETECTED
Vibrio cholerae: NOT DETECTED
Vibrio species: NOT DETECTED
YERSINIA ENTEROCOLITICA: NOT DETECTED

## 2016-01-31 ENCOUNTER — Encounter: Payer: Self-pay | Admitting: Emergency Medicine

## 2016-01-31 ENCOUNTER — Inpatient Hospital Stay
Admission: EM | Admit: 2016-01-31 | Discharge: 2016-02-01 | DRG: 392 | Disposition: A | Discharge status: Home / Self Care | Payer: Medicare Other | Attending: Internal Medicine | Admitting: Internal Medicine

## 2016-01-31 ENCOUNTER — Emergency Department: Payer: Medicare Other

## 2016-01-31 DIAGNOSIS — M069 Rheumatoid arthritis, unspecified: Secondary | ICD-10-CM | POA: Diagnosis present

## 2016-01-31 DIAGNOSIS — Z823 Family history of stroke: Secondary | ICD-10-CM | POA: Diagnosis not present

## 2016-01-31 DIAGNOSIS — Z7952 Long term (current) use of systemic steroids: Secondary | ICD-10-CM | POA: Diagnosis not present

## 2016-01-31 DIAGNOSIS — R112 Nausea with vomiting, unspecified: Secondary | ICD-10-CM

## 2016-01-31 DIAGNOSIS — Z79899 Other long term (current) drug therapy: Secondary | ICD-10-CM | POA: Diagnosis not present

## 2016-01-31 DIAGNOSIS — Z803 Family history of malignant neoplasm of breast: Secondary | ICD-10-CM | POA: Diagnosis not present

## 2016-01-31 DIAGNOSIS — I10 Essential (primary) hypertension: Secondary | ICD-10-CM | POA: Diagnosis present

## 2016-01-31 DIAGNOSIS — Z23 Encounter for immunization: Secondary | ICD-10-CM | POA: Diagnosis not present

## 2016-01-31 DIAGNOSIS — A09 Infectious gastroenteritis and colitis, unspecified: Secondary | ICD-10-CM | POA: Diagnosis not present

## 2016-01-31 DIAGNOSIS — R1084 Generalized abdominal pain: Secondary | ICD-10-CM

## 2016-01-31 DIAGNOSIS — R1033 Periumbilical pain: Secondary | ICD-10-CM

## 2016-01-31 DIAGNOSIS — K529 Noninfective gastroenteritis and colitis, unspecified: Secondary | ICD-10-CM

## 2016-01-31 DIAGNOSIS — R7989 Other specified abnormal findings of blood chemistry: Secondary | ICD-10-CM

## 2016-01-31 DIAGNOSIS — Z853 Personal history of malignant neoplasm of breast: Secondary | ICD-10-CM | POA: Diagnosis not present

## 2016-01-31 DIAGNOSIS — J849 Interstitial pulmonary disease, unspecified: Secondary | ICD-10-CM | POA: Diagnosis present

## 2016-01-31 HISTORY — DX: Rheumatoid arthritis, unspecified: M06.9

## 2016-01-31 HISTORY — DX: Interstitial pulmonary disease, unspecified: J84.9

## 2016-01-31 HISTORY — DX: Urticaria, unspecified: L50.9

## 2016-01-31 HISTORY — DX: Malignant neoplasm of unspecified site of unspecified female breast: C50.919

## 2016-01-31 HISTORY — DX: Peripheral vascular disease, unspecified: I73.9

## 2016-01-31 HISTORY — DX: Raynaud's syndrome without gangrene: I73.00

## 2016-01-31 HISTORY — DX: Systemic involvement of connective tissue, unspecified: M35.9

## 2016-01-31 LAB — COMPREHENSIVE METABOLIC PANEL
ALBUMIN: 4 g/dL (ref 3.5–5.0)
ALT: 18 U/L (ref 14–54)
AST: 29 U/L (ref 15–41)
Alkaline Phosphatase: 97 U/L (ref 38–126)
Anion gap: 11 (ref 5–15)
BUN: 13 mg/dL (ref 6–20)
CHLORIDE: 105 mmol/L (ref 101–111)
CO2: 25 mmol/L (ref 22–32)
Calcium: 9.1 mg/dL (ref 8.9–10.3)
Creatinine, Ser: 0.77 mg/dL (ref 0.44–1.00)
GFR calc Af Amer: >60 mL/min (ref >60)
GFR calc non Af Amer: >60 mL/min (ref >60)
GLUCOSE: 138 mg/dL — AB (ref 65–99)
POTASSIUM: 3.7 mmol/L (ref 3.5–5.1)
SODIUM: 141 mmol/L (ref 135–145)
Total Bilirubin: 0.4 mg/dL (ref 0.3–1.2)
Total Protein: 7.8 g/dL (ref 6.5–8.1)

## 2016-01-31 LAB — CBC
HEMATOCRIT: 40.7 % (ref 35.0–47.0)
Hemoglobin: 13.9 g/dL (ref 12.0–16.0)
MCH: 29.9 pg (ref 26.0–34.0)
MCHC: 34.1 g/dL (ref 32.0–36.0)
MCV: 87.5 fL (ref 80.0–100.0)
Platelets: 188 10*3/uL (ref 150–440)
RBC: 4.65 MIL/uL (ref 3.80–5.20)
RDW: 14.4 % (ref 11.5–14.5)
WBC: 10.9 10*3/uL (ref 3.6–11.0)

## 2016-01-31 LAB — LIPASE, BLOOD: LIPASE: 22 U/L (ref 11–51)

## 2016-01-31 LAB — LACTIC ACID, PLASMA
LACTIC ACID, VENOUS: 2.8 mmol/L — AB (ref 0.5–1.9)
Lactic Acid, Venous: 2.3 mmol/L (ref 0.5–1.9)

## 2016-01-31 LAB — URINALYSIS COMPLETE WITH MICROSCOPIC (ARMC ONLY)
BACTERIA UA: NONE SEEN
Bilirubin Urine: NEGATIVE
Glucose, UA: NEGATIVE mg/dL
Hgb urine dipstick: NEGATIVE
LEUKOCYTES UA: NEGATIVE
Nitrite: NEGATIVE
PH: 5 (ref 5.0–8.0)
PROTEIN: NEGATIVE mg/dL
SPECIFIC GRAVITY, URINE: 1.015 (ref 1.005–1.030)

## 2016-01-31 LAB — TSH: TSH: 1.83 u[IU]/mL (ref 0.350–4.500)

## 2016-01-31 MED ORDER — SODIUM CHLORIDE 0.9% FLUSH
3.0000 mL | Freq: Two times a day (BID) | INTRAVENOUS | Status: DC
  Administered 2016-01-31: 3 mL via INTRAVENOUS

## 2016-01-31 MED ORDER — INFLUENZA VAC SPLIT QUAD 0.5 ML IM SUSY
0.5000 mL | PREFILLED_SYRINGE | INTRAMUSCULAR | Status: AC
  Administered 2016-02-01: 0.5 mL via INTRAMUSCULAR
  Filled 2016-01-31: qty 0.5

## 2016-01-31 MED ORDER — ENOXAPARIN SODIUM 40 MG/0.4ML ~~LOC~~ SOLN
40.0000 mg | SUBCUTANEOUS | Status: DC
  Administered 2016-01-31: 40 mg via SUBCUTANEOUS
  Filled 2016-01-31: qty 0.4

## 2016-01-31 MED ORDER — ESTROGENS, CONJUGATED 0.625 MG/GM VA CREA
0.5000 g | TOPICAL_CREAM | VAGINAL | Status: DC
  Filled 2016-01-31: qty 30

## 2016-01-31 MED ORDER — IOPAMIDOL (ISOVUE-300) INJECTION 61%
100.0000 mL | Freq: Once | INTRAVENOUS | Status: AC | PRN
  Administered 2016-01-31: 100 mL via INTRAVENOUS

## 2016-01-31 MED ORDER — ACETAMINOPHEN 650 MG RE SUPP: 650.0000 mg | Freq: Four times a day (QID) | RECTAL | Status: DC | PRN

## 2016-01-31 MED ORDER — ONDANSETRON HCL 4 MG PO TABS: 4.0000 mg | ORAL_TABLET | Freq: Four times a day (QID) | ORAL | Status: DC | PRN

## 2016-01-31 MED ORDER — GUAIFENESIN ER 600 MG PO TB12
600.0000 mg | ORAL_TABLET | Freq: Two times a day (BID) | ORAL | Status: DC
  Administered 2016-02-01: 600 mg via ORAL
  Filled 2016-01-31: qty 1

## 2016-01-31 MED ORDER — SODIUM CHLORIDE 0.9 % IV SOLN
INTRAVENOUS | Status: DC
  Administered 2016-01-31 – 2016-02-01 (×2): via INTRAVENOUS

## 2016-01-31 MED ORDER — ONDANSETRON HCL 4 MG/2ML IJ SOLN: 4.0000 mg | Freq: Four times a day (QID) | INTRAMUSCULAR | Status: DC | PRN

## 2016-01-31 MED ORDER — HYDROCODONE-ACETAMINOPHEN 5-325 MG PO TABS
1.0000 | ORAL_TABLET | ORAL | Status: DC | PRN
  Administered 2016-01-31 – 2016-02-01 (×2): 1 via ORAL
  Filled 2016-01-31 (×2): qty 1

## 2016-01-31 MED ORDER — VITAMIN D 1000 UNITS PO TABS
1000.0000 [IU] | ORAL_TABLET | Freq: Every day | ORAL | Status: DC
  Administered 2016-02-01: 1000 [IU] via ORAL
  Filled 2016-01-31: qty 1

## 2016-01-31 MED ORDER — SODIUM CHLORIDE 0.9 % IV BOLUS (SEPSIS)
1000.0000 mL | Freq: Once | INTRAVENOUS | Status: AC
  Administered 2016-01-31: 1000 mL via INTRAVENOUS

## 2016-01-31 MED ORDER — ACETAMINOPHEN 325 MG PO TABS: 650.0000 mg | ORAL_TABLET | Freq: Four times a day (QID) | ORAL | Status: DC | PRN

## 2016-01-31 MED ORDER — PREDNISONE 10 MG PO TABS
5.0000 mg | ORAL_TABLET | Freq: Every day | ORAL | Status: DC
  Filled 2016-01-31: qty 1

## 2016-01-31 MED ORDER — FENTANYL CITRATE (PF) 100 MCG/2ML IJ SOLN
100.0000 ug | Freq: Once | INTRAMUSCULAR | Status: AC
  Administered 2016-01-31: 100 ug via INTRAVENOUS
  Filled 2016-01-31: qty 2

## 2016-01-31 MED ORDER — LEFLUNOMIDE 20 MG PO TABS
10.0000 mg | ORAL_TABLET | Freq: Every day | ORAL | Status: DC
  Administered 2016-01-31 – 2016-02-01 (×2): 10 mg via ORAL
  Filled 2016-01-31 (×2): qty 1

## 2016-01-31 MED ORDER — ONDANSETRON 4 MG PO TBDP
4.0000 mg | ORAL_TABLET | Freq: Once | ORAL | Status: AC | PRN
  Administered 2016-01-31: 4 mg via ORAL
  Filled 2016-01-31: qty 1

## 2016-01-31 MED ORDER — PIPERACILLIN-TAZOBACTAM 3.375 G IVPB
3.3750 g | Freq: Three times a day (TID) | INTRAVENOUS | Status: DC
  Administered 2016-01-31: 3.375 g via INTRAVENOUS
  Filled 2016-01-31: qty 50

## 2016-01-31 MED ORDER — ONDANSETRON HCL 4 MG/2ML IJ SOLN
4.0000 mg | Freq: Once | INTRAMUSCULAR | Status: AC
  Administered 2016-01-31: 4 mg via INTRAVENOUS
  Filled 2016-01-31: qty 2

## 2016-01-31 MED ORDER — RALOXIFENE HCL 60 MG PO TABS
60.0000 mg | ORAL_TABLET | Freq: Every evening | ORAL | Status: DC
  Administered 2016-01-31: 60 mg via ORAL
  Filled 2016-01-31: qty 1

## 2016-01-31 MED ORDER — PIPERACILLIN-TAZOBACTAM 3.375 G IVPB 30 MIN
3.3750 g | Freq: Once | INTRAVENOUS | Status: AC
  Administered 2016-01-31: 3.375 g via INTRAVENOUS
  Filled 2016-01-31: qty 50

## 2016-01-31 MED ORDER — MOMETASONE FURO-FORMOTEROL FUM 200-5 MCG/ACT IN AERO
2.0000 | INHALATION_SPRAY | Freq: Two times a day (BID) | RESPIRATORY_TRACT | Status: DC
  Administered 2016-02-01: 2 via RESPIRATORY_TRACT
  Filled 2016-01-31: qty 8.8

## 2016-01-31 MED ORDER — AMLODIPINE BESYLATE 5 MG PO TABS
2.5000 mg | ORAL_TABLET | Freq: Every day | ORAL | Status: DC
  Administered 2016-02-01: 2.5 mg via ORAL
  Filled 2016-01-31: qty 1

## 2016-01-31 MED ORDER — FLUTICASONE PROPIONATE 50 MCG/ACT NA SUSP
1.0000 | Freq: Every day | NASAL | Status: DC
  Administered 2016-01-31 – 2016-02-01 (×2): 1 via NASAL
  Filled 2016-01-31: qty 16

## 2016-01-31 NOTE — ED Notes (Signed)
Called CT to verbalized order from Dr. Joni Fears to scan her stat regardless of lack of finishing contrast.

## 2016-01-31 NOTE — ED Triage Notes (Signed)
Pt c/o diarrhea x8 weeks. Has been seen by PCP and put on cipro twice, last time for e coli infection. Completed abt course but does not feel better. Today developed vomiting and aching/cramping mid abd pain 10/10, worse with movement.

## 2016-01-31 NOTE — ED Notes (Signed)
Pt assisted to the bathroom in room. Pt ambulated independently with a steady gait. Pt able to produce urine with no difficulties reported.

## 2016-01-31 NOTE — H&P (Signed)
Eileen Maldonado    MR#:  [id number]  DATE OF BIRTH:  September 19, 1945  DATE OF ADMISSION:  01/31/2016  PRIMARY CARE PHYSICIAN: Eileen Pink, MD   REQUESTING/REFERRING PHYSICIAN: Carrie Maldonado M.D.  CHIEF COMPLAINT:   Chief Complaint  Patient presents with  . Abdominal Pain  . Emesis  . Diarrhea    HISTORY OF PRESENT ILLNESS: Eileen Maldonado  is a 70 y.o. female with a known history of  Interstitial lung disease, rheumatoid arthritis who has been having abdominal cramping ongoing for 8 weeks. She states that initially her symptoms started with abdominal cramping in the lower abdomen and epigastric region. As well as severe diarrhea. She did not have any blood in the stool. She was seen by her primary care provider he put her on Cipro for a few days. Her symptoms returned so she was referred to GI clinic where she was treated with Cipro for 5 days. She reports that her stools have become soft and firm. However this morning she started having abdominal pain and cramping at 5 AM which was severe there prompted her to come to the ER. In the ER she is noted to have ileitis.       PAST MEDICAL HISTORY:   Past Medical History:  Diagnosis Date  . Breast cancer ([place])   . Collagen vascular disease ([place])   . Hives   . ILD (interstitial lung disease) ([place])   . PVD (peripheral vascular disease) ([place])   . RA (rheumatoid arthritis) ([place])   . Raynaud's phenomenon     PAST SURGICAL HISTORY: Past Surgical History:  Procedure Laterality Date  . appy    . breast biopsy    . hernia repair      SOCIAL HISTORY:  Social History  Substance Use Topics  . Smoking status: Never Smoker  . Smokeless tobacco: Never Used  . Alcohol use No    FAMILY HISTORY:  Family History  Problem Relation Age of Onset  . Breast cancer Mother   . Stroke Father     DRUG ALLERGIES:  Allergies  Allergen Reactions  . Celebrex  [Celecoxib]   . Methotrexate Derivatives     REVIEW OF SYSTEMS:   CONSTITUTIONAL: No fever, fatigue or weakness.  EYES: No blurred or double vision.  EARS, NOSE, AND THROAT: No tinnitus or ear pain.  RESPIRATORY: No cough, shortness of breath, wheezing or hemoptysis.  CARDIOVASCULAR: No chest pain, orthopnea, edema.  GASTROINTESTINAL: No nausea, vomiting, Resolved diarrhea or Positive abdominal pain.  GENITOURINARY: No dysuria, hematuria.  ENDOCRINE: No polyuria, nocturia,  HEMATOLOGY: No anemia, easy bruising or bleeding SKIN: No rash or lesion. MUSCULOSKELETAL: No joint pain or arthritis.   NEUROLOGIC: No tingling, numbness, weakness.  PSYCHIATRY: No anxiety or depression.   MEDICATIONS AT HOME:  Prior to Admission medications   Medication Sig Start Date End Date Taking? Authorizing Provider  amLODipine (NORVASC) 2.5 MG tablet Take 2.5 mg by mouth daily.   Yes Historical Provider, MD  Cholecalciferol 1000 units tablet Take 1,000 Units by mouth daily.   Yes Historical Provider, MD  conjugated estrogens (PREMARIN) vaginal cream Place 0.5 g vaginally 2 (two) times a week.   Yes Historical Provider, MD  diclofenac (VOLTAREN) 50 MG EC tablet Take 50 mg by mouth 2 (two) times daily.   Yes Historical Provider, MD  etanercept (ENBREL) 50 MG/ML injection Inject 1 mL into the skin once a week. saturdays 01/14/16  Yes  Historical Provider, MD  fluticasone (FLONASE) 50 MCG/ACT nasal spray Place 1 spray into both nostrils daily.   Yes Historical Provider, MD  Fluticasone-Salmeterol (ADVAIR) 250-50 MCG/DOSE AEPB Inhale 1 puff into the lungs 2 (two) times daily.   Yes Historical Provider, MD  guaiFENesin (MUCINEX) 600 MG 12 hr tablet Take 600 mg by mouth 2 (two) times daily.   Yes Historical Provider, MD  leflunomide (ARAVA) 10 MG tablet Take 10 mg by mouth daily.   Yes Historical Provider, MD  raloxifene (EVISTA) 60 MG tablet Take 60 mg by mouth daily.   Yes Historical Provider, MD  Adalimumab  (HUMIRA PEN [place]) Inject into the skin.    Historical Provider, MD  predniSONE (DELTASONE) 5 MG tablet Take 5 mg by mouth daily with breakfast.    Historical Provider, MD      PHYSICAL EXAMINATION:   VITAL SIGNS: Blood pressure (!) 184/86, pulse 88, temperature 97.9 F (36.6 C), temperature source Oral, resp. rate 18, height 5\' 3"  (1.6 m), weight 155 lb (70.3 kg), SpO2 95 %.  GENERAL:  70 y.o.-year-old patient lying in the bed with no acute distress.  EYES: Pupils equal, round, reactive to light and accommodation. No scleral icterus. Extraocular muscles intact.  HEENT: Head atraumatic, normocephalic. Oropharynx and nasopharynx clear.  NECK:  Supple, no jugular venous distention. No thyroid enlargement, no tenderness.  LUNGS: Normal breath sounds bilaterally, no wheezing, rales,rhonchi or crepitation. No use of accessory muscles of respiration.  CARDIOVASCULAR: S1, S2 normal. No murmurs, rubs, or gallops.  ABDOMEN: Soft, suprapubic and epigastric tenderness nondistended. Bowel sounds present. No organomegaly or mass.  EXTREMITIES: No pedal edema, cyanosis, or clubbing.  NEUROLOGIC: Cranial nerves II through XII are intact. Muscle strength 5/5 in all extremities. Sensation intact. Gait not checked.  PSYCHIATRIC: The patient is alert and oriented x 3.  SKIN: No obvious rash, lesion, or ulcer.   LABORATORY PANEL:   CBC  Recent Labs Lab 01/31/16 1419  WBC 10.9  HGB 13.9  HCT 40.7  PLT 188  MCV 87.5  MCH 29.9  MCHC 34.1  RDW 14.4   ------------------------------------------------------------------------------------------------------------------  Chemistries   Recent Labs Lab 01/31/16 1419  NA 141  K 3.7  CL 105  CO2 25  GLUCOSE 138*  BUN 13  CREATININE 0.77  CALCIUM 9.1  AST 29  ALT 18  ALKPHOS 97  BILITOT 0.4   ------------------------------------------------------------------------------------------------------------------ estimated creatinine clearance is 61.6  mL/min (by C-G formula based on SCr of 0.77 mg/dL). ------------------------------------------------------------------------------------------------------------------ No results for input(s): TSH, T4TOTAL, T3FREE, THYROIDAB in the last 72 hours.  Invalid input(s): FREET3   Coagulation profile No results for input(s): INR, PROTIME in the last 168 hours. ------------------------------------------------------------------------------------------------------------------- No results for input(s): DDIMER in the last 72 hours. -------------------------------------------------------------------------------------------------------------------  Cardiac Enzymes No results for input(s): CKMB, TROPONINI, MYOGLOBIN in the last 168 hours.  Invalid input(s): CK ------------------------------------------------------------------------------------------------------------------ Invalid input(s): POCBNP  ---------------------------------------------------------------------------------------------------------------  Urinalysis    Component Value Date/Time   COLORURINE YELLOW (A) 01/31/2016 1519   APPEARANCEUR CLEAR (A) 01/31/2016 1519   LABSPEC 1.015 01/31/2016 1519   PHURINE 5.0 01/31/2016 1519   GLUCOSEU NEGATIVE 01/31/2016 1519   HGBUR NEGATIVE 01/31/2016 1519   BILIRUBINUR NEGATIVE 01/31/2016 1519   KETONESUR TRACE (A) 01/31/2016 1519   PROTEINUR NEGATIVE 01/31/2016 1519   NITRITE NEGATIVE 01/31/2016 1519   LEUKOCYTESUR NEGATIVE 01/31/2016 1519     RADIOLOGY: Ct Abdomen Pelvis W Contrast  Result Date: 01/31/2016 CLINICAL DATA:  Diarrhea for 3 weeks. Treated with antibiotics for infection. Vomiting  and cramping with mid abdominal pain. History of collagen vascular disease. Rheumatoid arthritis. EXAM: CT ABDOMEN AND PELVIS WITH CONTRAST TECHNIQUE: Multidetector CT imaging of the abdomen and pelvis was performed using the standard protocol following bolus administration of intravenous contrast.  CONTRAST:  136mL ISOVUE-300 IOPAMIDOL (ISOVUE-300) INJECTION 61% COMPARISON:  None. FINDINGS: Lower chest: Interstitial lung disease, as on prior dedicated chest CT. Grossly similar. Normal heart size without pericardial or pleural effusion. Hepatobiliary: Normal liver. Normal gallbladder, without biliary ductal dilatation. Pancreas: Mild pancreatic atrophy, without duct dilatation or dominant mass. Spleen: Normal in size, without focal abnormality. Adrenals/Urinary Tract: Normal adrenal glands. Normal kidneys, without hydronephrosis. Normal urinary bladder. Stomach/Bowel: Normal stomach, without wall thickening. Scattered colonic diverticula. Colonic stool burden suggests constipation. Normal terminal ileum. Appendix not visualized. Small bowel loops are normal in caliber. mesenteric edema involving mid and distal small bowel loops, including on image 64/series 2. Suspect mild thickening of 1 of the small bowel loops on image 67/series 2. No pneumatosis or free intraperitoneal air. Vascular/Lymphatic: Aortic and branch vessel atherosclerosis. No mesenteric arterial or venous thrombus. No significant stenosis of the celiac or SMA on this nondedicated study. No abdominopelvic adenopathy. Reproductive: Fibroid uterus, with a dominant posterior body 5.3 cm mass. No adnexal mass. Other: Small volume abdominal ascites and free pelvic fluid. This is simple in appearance. Musculoskeletal: Lower thoracic spondylosis. IMPRESSION: 1. Ileal mesenteric edema with small volume abdominal pelvic ascites. Suspectat least 1 thickened loop of ileum. Findings overall suggest ileitis, most likely infectious. No obstruction or other acute complication. Terminal ileum is normal. 2.  Possible constipation. 3.  Aortic atherosclerosis. 4. Fibroid uterus. Electronically Signed   By: Abigail Miyamoto M.D.   On: 01/31/2016 17:13    EKG: Orders placed or performed during the hospital encounter of 01/31/16  . ED EKG  . ED EKG  . EKG 12-Lead   . EKG 12-Lead    IMPRESSION AND PLAN: Patient is a 70 year old with abdominal pain 1. Ileitis suspect this to be related to an infectious etiology We will treat with IV Zosyn, also pharmacy to review her home medications to see if this may be causing her symptoms I will last GI to see the patient Give her IV fluids  2. Hypertension essential continue Norvasc  3. History of rheumatoid arthritis continue prednisone   4. History of breast cancer continue Avista.  5. Miscellaneous Lovenox for DVT prophylaxis    All the records are reviewed and case discussed with ED provider. Management plans discussed with the patient, family and they are in agreement.  CODE STATUS: Code Status History    This patient does not have a recorded code status. Please follow your organizational policy for patients in this situation.    Advance Directive Documentation   [place] Most Recent Value  Type of Advance Directive  Living will  Pre-existing out of facility DNR order (yellow form or [place] MOST form)  No data  "MOST" Form in Place?  No data       TOTAL TIME TAKING CARE OF THIS PATIENT: 55 minutes.    Dustin Flock M.D on 01/31/2016 at 7:11 PM  Between 7am to 6pm - Pager - [phone]  After 6pm go to [email or web address] - password [email or web address] [facility]  [facility]  Office  [phone]  CC: Primary care physician; Eileen Pink, MD

## 2016-01-31 NOTE — ED Notes (Signed)
Pt assisted to bathroom

## 2016-01-31 NOTE — Progress Notes (Signed)
Pharmacy Antibiotic Note  Delilah Favro Carreno is a 70 y.o. female admitted on 01/31/2016 with Intra-abdominal Infection.   Pharmacy has been consulted for Zosyn dosing.  Plan: Will start patient on Zosyn 3.375 IV EI every 8 hours  Height: 5\' 3"  (160 cm) Weight: 155 lb (70.3 kg) IBW/kg (Calculated) : 52.4  Temp (24hrs), Avg:97.9 F (36.6 C), Min:97.9 F (36.6 C), Max:97.9 F (36.6 C)   Recent Labs Lab 01/31/16 1419 01/31/16 1519 01/31/16 1800  WBC 10.9  --   --   CREATININE 0.77  --   --   LATICACIDVEN  --  2.8* 2.3*    Estimated Creatinine Clearance: 61.6 mL/min (by C-G formula based on SCr of 0.77 mg/dL).    Allergies  Allergen Reactions  . Celebrex [Celecoxib]   . Methotrexate Derivatives     Antimicrobials this admission: 11/5 Zosyn >>   Dose adjustments this admission:   Microbiology results:  Thank you for allowing pharmacy to be a part of this patient's care.  Pernell Dupre, PharmD Clinical Pharmacist  01/31/2016 7:21 PM

## 2016-01-31 NOTE — ED Provider Notes (Signed)
[facility] Emergency Department Provider Note  ____________________________________________  Time seen: Approximately 2:52 PM  I have reviewed the triage vital signs and the nursing notes.   HISTORY  Chief Complaint Abdominal Pain; Emesis; and Diarrhea    HPI Eileen Maldonado is a 70 y.o. female with a history of rheumatoid arthritis, recently treated twice for Escherichia coli diarrhea, presenting with acute onset of severe abdominal pain associated with nausea and vomiting. The patient reports that over the last 8 weeks, she has had 2 different courses of ciprofloxacin, one by her PMD in 1 by a GI specialist after a positive stool study. Initially, her diarrhea improved, and she is still having some loose stools but overall nose that she is better. At 5 AM this morning, she awoke with severe periumbilical pain associated with nausea and vomiting. No fevers or chills, urinary symptoms.   History reviewed. No pertinent past medical history.  Patient Active Problem List   Diagnosis Date Noted  . Rheumatoid arthritis ([place]) 07/28/2014    History reviewed. No pertinent surgical history.  Current Outpatient Rx  . Order #: [id number] Class: Historical Med  . Order #[id number] Class: Historical Med  . Order #: [id number] Class: Historical Med  . Order #[id number] Class: Historical Med  . Order #[id number] Class: Historical Med    Allergies Celebrex [celecoxib] and Methotrexate derivatives  History reviewed. No pertinent family history.  Social History Social History  Substance Use Topics  . Smoking status: Never Smoker  . Smokeless tobacco: Never Used  . Alcohol use No    Review of Systems Constitutional: No fever/chills.No lightheadedness or syncope. Eyes: No visual changes. ENT: No sore throat. No congestion or rhinorrhea. Cardiovascular: Denies chest pain. Denies palpitations. Respiratory: Denies shortness of breath.  No cough. Gastrointestinal:  Positive periumbilical abdominal pain.  Positive nausea, positive vomiting.  Positive nonbloody diarrhea.  No constipation. Genitourinary: Negative for dysuria. Musculoskeletal: Negative for back pain. Skin: Negative for rash. Neurological: Negative for headaches. No focal numbness, tingling or weakness.   10-point ROS otherwise negative.  ____________________________________________   PHYSICAL EXAM:  VITAL SIGNS: ED Triage Vitals  Enc Vitals Group     BP 01/31/16 1415 (!) 174/99     Pulse Rate 01/31/16 1415 91     Resp 01/31/16 1415 18     Temp 01/31/16 1415 97.9 F (36.6 C)     Temp Source 01/31/16 1415 Oral     SpO2 01/31/16 1415 100 %     Weight 01/31/16 1410 155 lb (70.3 kg)     Height 01/31/16 1410 5\' 3"  (1.6 m)     Head Circumference --      Peak Flow --      Pain Score 01/31/16 1410 10     Pain Loc --      Pain Edu? --      Excl. in [place]? --     Constitutional: Alert and oriented. Tension over in fetal position with significant discomfort with any movement, actively vomiting, very uncomfortable but nontoxic. Answers questions appropriately. Eyes: Conjunctivae are normal.  EOMI. No scleral icterus. Head: Atraumatic. Nose: No congestion/rhinnorhea. Mouth/Throat: Mucous membranes are dry.  Neck: No stridor.  Supple.  No JVD. No meningismus. Cardiovascular: Normal rate, regular rhythm. No murmurs, rubs or gallops.  Respiratory: Normal respiratory effort.  No accessory muscle use or retractions. Lungs CTAB.  No wheezes, rales or ronchi. Gastrointestinal: Abdomen is soft and nondistended. The patient has diffuse tenderness to palpation, most prominent over the periumbilical and epigastric  areas  No guarding or rebound.  No peritoneal signs. Musculoskeletal: No LE edema. No ttp in the calves or palpable cords.  Negative Homan's sign. Neurologic:  A&Ox3.  Speech is clear.  Face and smile are symmetric.  EOMI.  Moves all extremities well. Skin:  Skin is warm, dry and intact.  No rash noted. Psychiatric: Mood and affect are normal. Speech and behavior are normal.  Normal judgement.  ____________________________________________   LABS (all labs ordered are listed, but only abnormal results are displayed)  Labs Reviewed  C DIFFICILE QUICK SCREEN W PCR REFLEX  CBC  LIPASE, BLOOD  COMPREHENSIVE METABOLIC PANEL  URINALYSIS COMPLETEWITH MICROSCOPIC ([facility] ONLY)  LACTIC ACID, PLASMA  LACTIC ACID, PLASMA   ____________________________________________  EKG  Not complete at sign out; Dr. Joni Fears will f/u EKG.  ____________________________________________  RADIOLOGY  No results found.  ____________________________________________   PROCEDURES  Procedure(s) performed: None  Procedures  Critical Care performed: No ____________________________________________   INITIAL IMPRESSION / ASSESSMENT AND PLAN / ED COURSE  Pertinent labs & imaging results that were available during my care of the patient were reviewed by me and considered in my medical decision making (see chart for details).  70 y.o. female with recent treatment for Escherichia coli diarrhea presenting with acute onset severe abdominal pain with associated nausea and vomiting. On my examination I am concerned about multiple possible etiologies including diverticulitis, obstruction or partial obstruction, mesenteric ischemia, appendicitis. They she'll require CT for further evaluation. I will initiate some dramatic treatment immediately.  The patient has been signed out to Dr. Brenton Grills who will follow up on her results, continue symptomatic treatment, and re-evaluate the patient for final disposition.  ____________________________________________  FINAL CLINICAL IMPRESSION(S) / ED DIAGNOSES  Final diagnoses:  Acute periumbilical pain  Intractable vomiting with nausea, unspecified vomiting type    Clinical Course       NEW MEDICATIONS STARTED DURING THIS VISIT:  New  Prescriptions   No medications on file      Eula Listen, MD 01/31/16 1457

## 2016-01-31 NOTE — ED Notes (Signed)
Pt assisted to the bathroom in room. Pt's gait was steady. Pt was able to urinate without difficulty.

## 2016-01-31 NOTE — ED Provider Notes (Signed)
 -----------------------------------------   4:17 PM on 01/31/2016 -----------------------------------------  Assumed care from Dr. Mariea Clonts awaiting labs and CT scan. Lactate resulted at 2.6. IV fluids infusing. Patient is still drinking oral contrast. I've asked that we hold off on the rest of the oral contrast and have the patient scan as soon as possible given her concerning appearance and elevated lactate.    ----------------------------------------- 11:10 PM on 01/31/2016 -----------------------------------------  Results were obtained. The patient was still having significant abdominal tenderness with CT findings of enteritis, so started the patient on Zosyn and discussed with the hospitalist for further management. She remained hemodynamically stable until transported to the medical floor.   Carrie Mew, MD 01/31/16 [phone]

## 2016-02-01 LAB — BASIC METABOLIC PANEL
ANION GAP: 6 (ref 5–15)
BUN: 9 mg/dL (ref 6–20)
CALCIUM: 8.2 mg/dL — AB (ref 8.9–10.3)
CO2: 27 mmol/L (ref 22–32)
Chloride: 109 mmol/L (ref 101–111)
Creatinine, Ser: 0.81 mg/dL (ref 0.44–1.00)
Glucose, Bld: 95 mg/dL (ref 65–99)
POTASSIUM: 3.4 mmol/L — AB (ref 3.5–5.1)
SODIUM: 142 mmol/L (ref 135–145)

## 2016-02-01 LAB — CBC
HCT: 34.1 % — ABNORMAL LOW (ref 35.0–47.0)
Hemoglobin: 11.3 g/dL — ABNORMAL LOW (ref 12.0–16.0)
MCH: 29.3 pg (ref 26.0–34.0)
MCHC: 33 g/dL (ref 32.0–36.0)
MCV: 88.6 fL (ref 80.0–100.0)
PLATELETS: 167 10*3/uL (ref 150–440)
RBC: 3.85 MIL/uL (ref 3.80–5.20)
RDW: 14.1 % (ref 11.5–14.5)
WBC: 8.2 10*3/uL (ref 3.6–11.0)

## 2016-02-01 MED ORDER — AMOXICILLIN-POT CLAVULANATE 875-125 MG PO TABS
1.0000 | ORAL_TABLET | Freq: Two times a day (BID) | ORAL | Status: DC
  Administered 2016-02-01: 1 via ORAL
  Filled 2016-02-01: qty 1

## 2016-02-01 MED ORDER — SODIUM CHLORIDE 0.9 % IV SOLN
3.0000 g | Freq: Four times a day (QID) | INTRAVENOUS | Status: DC
  Filled 2016-02-01 (×3): qty 3

## 2016-02-01 MED ORDER — HYDROCODONE-ACETAMINOPHEN 5-325 MG PO TABS: 1.0000 | ORAL_TABLET | Freq: Three times a day (TID) | ORAL | 0 refills | Status: DC | PRN

## 2016-02-01 MED ORDER — AMOXICILLIN-POT CLAVULANATE 875-125 MG PO TABS: 1.0000 | ORAL_TABLET | Freq: Two times a day (BID) | ORAL | 0 refills | Status: DC

## 2016-02-01 NOTE — Discharge Summary (Signed)
[facility] NAME: Eileen Maldonado    MR#:  [id number]  DATE OF BIRTH:  1945-08-20  DATE OF ADMISSION:  01/31/2016 ADMITTING PHYSICIAN: Dustin Flock, MD  DATE OF DISCHARGE: 02/01/16  PRIMARY CARE PHYSICIAN: Maryland Pink, MD    ADMISSION DIAGNOSIS:  Ileitis [K52.9] Generalized abdominal pain [id number] Acute periumbilical pain [id number] Elevated lactic acid level [R79.89] Intractable vomiting with nausea, unspecified vomiting type [R11.2]  DISCHARGE DIAGNOSIS:  Acute Ileitis-improving diarrhea resolved  SECONDARY DIAGNOSIS:   Past Medical History:  Diagnosis Date  . Breast cancer ([place])   . Collagen vascular disease ([place])   . Hives   . ILD (interstitial lung disease) ([place])   . PVD (peripheral vascular disease) ([place])   . RA (rheumatoid arthritis) ([place])   . Raynaud's phenomenon     HOSPITAL COURSE:   70 year old with abdominal pain 1. Acute Ileitis suspect this to be related to an infectious etiology Received IV Zosyn---change to po augmentin -per pt pain improving. Tolerating soft diet.  -curbsided GI -agrees with plan. Pt will follow up with [facility] in 7-10 days -received IV fluids. -no more diarrhea. Last BM was soft y'day  2. Hypertension essential - continue Norvasc  3. History of rheumatoid arthritis  continue prednisone and DMARD's per home meds  4. History of breast cancer  -continue Evista.  5. Miscellaneous Lovenox for DVT prophylaxis  Overall improving D/c home with out pt GI f/u D/w pt diet options  CONSULTS OBTAINED:  Treatment Team:  Lucilla Lame, MD  DRUG ALLERGIES:   Allergies  Allergen Reactions  . Celebrex [Celecoxib]   . Methotrexate Derivatives     DISCHARGE MEDICATIONS:   Current Discharge Medication List    START taking these medications   Details  amoxicillin-clavulanate (AUGMENTIN) 875-125 MG tablet Take 1 tablet by mouth every 12 (twelve)  hours. Qty: 16 tablet, Refills: 0    HYDROcodone-acetaminophen (NORCO/VICODIN) 5-325 MG tablet Take 1-2 tablets by mouth 3 (three) times daily as needed for moderate pain. Qty: 15 tablet, Refills: 0      CONTINUE these medications which have NOT CHANGED   Details  amLODipine (NORVASC) 2.5 MG tablet Take 2.5 mg by mouth daily.    Cholecalciferol 1000 units tablet Take 1,000 Units by mouth daily.    conjugated estrogens (PREMARIN) vaginal cream Place 0.5 g vaginally 2 (two) times a week.    diclofenac (VOLTAREN) 50 MG EC tablet Take 50 mg by mouth 2 (two) times daily.    etanercept (ENBREL) 50 MG/ML injection Inject 1 mL into the skin once a week. saturdays    fluticasone (FLONASE) 50 MCG/ACT nasal spray Place 1 spray into both nostrils daily.    Fluticasone-Salmeterol (ADVAIR) 250-50 MCG/DOSE AEPB Inhale 1 puff into the lungs 2 (two) times daily.    guaiFENesin (MUCINEX) 600 MG 12 hr tablet Take 600 mg by mouth 2 (two) times daily.    leflunomide (ARAVA) 10 MG tablet Take 10 mg by mouth daily.    raloxifene (EVISTA) 60 MG tablet Take 60 mg by mouth daily.    Adalimumab (HUMIRA PEN [place]) Inject into the skin.    predniSONE (DELTASONE) 5 MG tablet Take 5 mg by mouth daily with breakfast.        If you experience worsening of your admission symptoms, develop shortness of breath, life threatening emergency, suicidal or homicidal thoughts you must seek medical attention immediately by calling 911 or calling your MD immediately  if  symptoms less severe.  You Must read complete instructions/literature along with all the possible adverse reactions/side effects for all the Medicines you take and that have been prescribed to you. Take any new Medicines after you have completely understood and accept all the possible adverse reactions/side effects.   Please note  You were cared for by a hospitalist during your hospital stay. If you have any questions about your discharge medications or  the care you received while you were in the hospital after you are discharged, you can call the unit and asked to speak with the hospitalist on call if the hospitalist that took care of you is not available. Once you are discharged, your primary care physician will handle any further medical issues. Please note that NO REFILLS for any discharge medications will be authorized once you are discharged, as it is imperative that you return to your primary care physician (or establish a relationship with a primary care physician if you do not have one) for your aftercare needs so that they can reassess your need for medications and monitor your lab values. Today   SUBJECTIVE   Feels better than y'day. Ate eggs and french toast No diarrhea  VITAL SIGNS:  Blood pressure 138/74, pulse 87, temperature 98.1 F (36.7 C), resp. rate 18, height 5\' 3"  (1.6 m), weight 69.3 kg (152 lb 11.2 oz), SpO2 98 %.  I/O:   Intake/Output Summary (Last 24 hours) at 02/01/16 1208 Last data filed at 02/01/16 1136  Gross per 24 hour  Intake             1187 ml  Output              300 ml  Net              887 ml    PHYSICAL EXAMINATION:  GENERAL:  70 y.o.-year-old patient lying in the bed with no acute distress.  EYES: Pupils equal, round, reactive to light and accommodation. No scleral icterus. Extraocular muscles intact.  HEENT: Head atraumatic, normocephalic. Oropharynx and nasopharynx clear.  NECK:  Supple, no jugular venous distention. No thyroid enlargement, no tenderness.  LUNGS: Normal breath sounds bilaterally, no wheezing, rales,rhonchi or crepitation. No use of accessory muscles of respiration.  CARDIOVASCULAR: S1, S2 normal. No murmurs, rubs, or gallops.  ABDOMEN: Soft, non-tender, non-distended. Bowel sounds present. No organomegaly or mass.  EXTREMITIES: No pedal edema, cyanosis, or clubbing.  NEUROLOGIC: Cranial nerves II through XII are intact. Muscle strength 5/5 in all extremities. Sensation intact.  Gait not checked.  PSYCHIATRIC:  patient is alert and oriented x 3.  SKIN: No obvious rash, lesion, or ulcer.   DATA REVIEW:   CBC   Recent Labs Lab 02/01/16 0631  WBC 8.2  HGB 11.3*  HCT 34.1*  PLT 167    Chemistries   Recent Labs Lab 01/31/16 1419 02/01/16 0631  NA 141 142  K 3.7 3.4*  CL 105 109  CO2 25 27  GLUCOSE 138* 95  BUN 13 9  CREATININE 0.77 0.81  CALCIUM 9.1 8.2*  AST 29  --   ALT 18  --   ALKPHOS 97  --   BILITOT 0.4  --     Microbiology Results   No results found for this or any previous visit (from the past 240 hour(s)).  RADIOLOGY:  Ct Abdomen Pelvis W Contrast  Result Date: 01/31/2016 CLINICAL DATA:  Diarrhea for 3 weeks. Treated with antibiotics for infection. Vomiting and cramping with mid abdominal  pain. History of collagen vascular disease. Rheumatoid arthritis. EXAM: CT ABDOMEN AND PELVIS WITH CONTRAST TECHNIQUE: Multidetector CT imaging of the abdomen and pelvis was performed using the standard protocol following bolus administration of intravenous contrast. CONTRAST:  146mL ISOVUE-300 IOPAMIDOL (ISOVUE-300) INJECTION 61% COMPARISON:  None. FINDINGS: Lower chest: Interstitial lung disease, as on prior dedicated chest CT. Grossly similar. Normal heart size without pericardial or pleural effusion. Hepatobiliary: Normal liver. Normal gallbladder, without biliary ductal dilatation. Pancreas: Mild pancreatic atrophy, without duct dilatation or dominant mass. Spleen: Normal in size, without focal abnormality. Adrenals/Urinary Tract: Normal adrenal glands. Normal kidneys, without hydronephrosis. Normal urinary bladder. Stomach/Bowel: Normal stomach, without wall thickening. Scattered colonic diverticula. Colonic stool burden suggests constipation. Normal terminal ileum. Appendix not visualized. Small bowel loops are normal in caliber. mesenteric edema involving mid and distal small bowel loops, including on image 64/series 2. Suspect mild thickening of 1 of  the small bowel loops on image 67/series 2. No pneumatosis or free intraperitoneal air. Vascular/Lymphatic: Aortic and branch vessel atherosclerosis. No mesenteric arterial or venous thrombus. No significant stenosis of the celiac or SMA on this nondedicated study. No abdominopelvic adenopathy. Reproductive: Fibroid uterus, with a dominant posterior body 5.3 cm mass. No adnexal mass. Other: Small volume abdominal ascites and free pelvic fluid. This is simple in appearance. Musculoskeletal: Lower thoracic spondylosis. IMPRESSION: 1. Ileal mesenteric edema with small volume abdominal pelvic ascites. Suspectat least 1 thickened loop of ileum. Findings overall suggest ileitis, most likely infectious. No obstruction or other acute complication. Terminal ileum is normal. 2.  Possible constipation. 3.  Aortic atherosclerosis. 4. Fibroid uterus. Electronically Signed   By: Abigail Miyamoto M.D.   On: 01/31/2016 17:13     Management plans discussed with the patient, family and they are in agreement.  CODE STATUS:     Code Status Orders        Start     Ordered   01/31/16 2038  Full code  Continuous     01/31/16 2037    Code Status History    Date Active Date Inactive Code Status Order ID Comments User Context   This patient has a current code status but no historical code status.    Advance Directive Documentation   [place] Most Recent Value  Type of Advance Directive  Living will  Pre-existing out of facility DNR order (yellow form or pink MOST form)  No data  "MOST" Form in Place?  No data      TOTAL TIME TAKING CARE OF THIS PATIENT: 40 minutes.    Santonio Speakman M.D on 02/01/2016 at 12:08 PM  Between 7am to 6pm - Pager - [phone] After 6pm go to [email or web address] - password [email or web address] [facility]  [facility]  Office  [phone]  CC: Primary care physician; [place] Pink, MD

## 2016-02-01 NOTE — Progress Notes (Signed)
IV was removed. Discharge instructions, follow-up appointments, and prescriptions were provided to the pt. The pt is currently waiting on transportation.

## 2016-02-01 NOTE — Discharge Instructions (Signed)
Full liquid to soft diet Avoid big meals

## 2016-02-01 NOTE — Progress Notes (Signed)
Pharmacy Antibiotic Note  Eileen Maldonado is a 70 y.o. female admitted on 01/31/2016 with Intra-abdominal Infection.   Pharmacy has been consulted for Unasyn dosing.  Plan: unasyn 3g q 6 hours  Height: 5\' 3"  (160 cm) Weight: 152 lb 11.2 oz (69.3 kg) IBW/kg (Calculated) : 52.4  Temp (24hrs), Avg:98 F (36.7 C), Min:97.9 F (36.6 C), Max:98.1 F (36.7 C)   Recent Labs Lab 01/31/16 1419 01/31/16 1519 01/31/16 1800 02/01/16 0631  WBC 10.9  --   --  8.2  CREATININE 0.77  --   --  0.81  LATICACIDVEN  --  2.8* 2.3*  --     Estimated Creatinine Clearance: 60.4 mL/min (by C-G formula based on SCr of 0.81 mg/dL).    Allergies  Allergen Reactions  . Celebrex [Celecoxib]   . Methotrexate Derivatives     Antimicrobials this admission: 11/5 Zosyn >> 11/6 11/6 unasyn >>  Dose adjustments this admission:   Microbiology results:  Thank you for allowing pharmacy to be a part of this patient's care.  Ramond Dial, PharmD Clinical Pharmacist  02/01/2016 11:26 AM

## 2016-04-26 ENCOUNTER — Other Ambulatory Visit: Payer: Self-pay | Admitting: Rheumatology

## 2016-04-26 DIAGNOSIS — M5440 Lumbago with sciatica, unspecified side: Secondary | ICD-10-CM

## 2016-05-04 ENCOUNTER — Ambulatory Visit
Admission: RE | Admit: 2016-05-04 | Discharge: 2016-05-04 | Disposition: A | Discharge status: Home / Self Care | Payer: Medicare Other | Source: Ambulatory Visit | Attending: Rheumatology | Admitting: Rheumatology

## 2016-05-04 DIAGNOSIS — M5127 Other intervertebral disc displacement, lumbosacral region: Secondary | ICD-10-CM | POA: Insufficient documentation

## 2016-05-04 DIAGNOSIS — M5126 Other intervertebral disc displacement, lumbar region: Secondary | ICD-10-CM | POA: Insufficient documentation

## 2016-05-04 DIAGNOSIS — M5440 Lumbago with sciatica, unspecified side: Secondary | ICD-10-CM

## 2016-05-04 DIAGNOSIS — M48061 Spinal stenosis, lumbar region without neurogenic claudication: Secondary | ICD-10-CM | POA: Insufficient documentation

## 2016-05-04 DIAGNOSIS — M545 Low back pain: Secondary | ICD-10-CM | POA: Diagnosis present

## 2016-06-22 ENCOUNTER — Encounter
Admission: RE | Admit: 2016-06-22 | Discharge: 2016-06-22 | Disposition: A | Discharge status: Home / Self Care | Payer: Medicare Other | Source: Ambulatory Visit | Attending: Orthopedic Surgery | Admitting: Orthopedic Surgery

## 2016-06-22 DIAGNOSIS — M1712 Unilateral primary osteoarthritis, left knee: Secondary | ICD-10-CM | POA: Insufficient documentation

## 2016-06-22 DIAGNOSIS — Z01812 Encounter for preprocedural laboratory examination: Secondary | ICD-10-CM | POA: Diagnosis present

## 2016-06-22 DIAGNOSIS — Z0183 Encounter for blood typing: Secondary | ICD-10-CM | POA: Diagnosis not present

## 2016-06-22 HISTORY — DX: Nontoxic single thyroid nodule: E04.1

## 2016-06-22 LAB — COMPREHENSIVE METABOLIC PANEL
ALBUMIN: 4.1 g/dL (ref 3.5–5.0)
ALT: 21 U/L (ref 14–54)
ANION GAP: 7 (ref 5–15)
AST: 25 U/L (ref 15–41)
Alkaline Phosphatase: 76 U/L (ref 38–126)
BUN: 19 mg/dL (ref 6–20)
CO2: 28 mmol/L (ref 22–32)
Calcium: 9.4 mg/dL (ref 8.9–10.3)
Chloride: 107 mmol/L (ref 101–111)
Creatinine, Ser: 0.66 mg/dL (ref 0.44–1.00)
GFR calc Af Amer: >60 mL/min (ref >60)
GFR calc non Af Amer: >60 mL/min (ref >60)
GLUCOSE: 79 mg/dL (ref 65–99)
POTASSIUM: 3.7 mmol/L (ref 3.5–5.1)
SODIUM: 142 mmol/L (ref 135–145)
TOTAL PROTEIN: 7.8 g/dL (ref 6.5–8.1)
Total Bilirubin: 0.5 mg/dL (ref 0.3–1.2)

## 2016-06-22 LAB — URINALYSIS, ROUTINE W REFLEX MICROSCOPIC
BILIRUBIN URINE: NEGATIVE
GLUCOSE, UA: NEGATIVE mg/dL
HGB URINE DIPSTICK: NEGATIVE
Ketones, ur: NEGATIVE mg/dL
Leukocytes, UA: NEGATIVE
Nitrite: NEGATIVE
Protein, ur: NEGATIVE mg/dL
SPECIFIC GRAVITY, URINE: 1.023 (ref 1.005–1.030)
pH: 5 (ref 5.0–8.0)

## 2016-06-22 LAB — CBC
HCT: 40.2 % (ref 35.0–47.0)
Hemoglobin: 13.3 g/dL (ref 12.0–16.0)
MCH: 29.4 pg (ref 26.0–34.0)
MCHC: 33 g/dL (ref 32.0–36.0)
MCV: 89.1 fL (ref 80.0–100.0)
PLATELETS: 175 10*3/uL (ref 150–440)
RBC: 4.51 MIL/uL (ref 3.80–5.20)
RDW: 14.6 % — ABNORMAL HIGH (ref 11.5–14.5)
WBC: 6.2 10*3/uL (ref 3.6–11.0)

## 2016-06-22 LAB — TYPE AND SCREEN
ABO/RH(D): A POS
Antibody Screen: NEGATIVE

## 2016-06-22 LAB — SEDIMENTATION RATE: SED RATE: 11 mm/h (ref 0–30)

## 2016-06-22 LAB — PROTIME-INR
INR: 1.11
Prothrombin Time: 14.4 seconds (ref 11.4–15.2)

## 2016-06-22 LAB — APTT: APTT: 29 s (ref 24–36)

## 2016-06-22 LAB — C-REACTIVE PROTEIN: CRP: <0.8 mg/dL (ref <1.0)

## 2016-06-22 LAB — SURGICAL PCR SCREEN
MRSA, PCR: NEGATIVE
STAPHYLOCOCCUS AUREUS: NEGATIVE

## 2016-06-22 NOTE — Patient Instructions (Addendum)
Your procedure is scheduled on: July 06, 2016 (Wednesday) Report to Same Day Surgery 2nd floor medical mall [facility] Entrance-take elevator on left to 2nd floor.  Check in with surgery information desk.) To find out your arrival time please call [phone] between 1PM - 3PM on July 05, 2016 (Tuesday)  Remember: Instructions that are not followed completely may result in serious medical risk, up to and including death, or upon the discretion of your surgeon and anesthesiologist your surgery may need to be rescheduled.    _x___ 1. Do not eat food or drink liquids after midnight. No gum chewing or                              hard candies.     __x__ 2. No Alcohol for 24 hours before or after surgery.   __x__3. No Smoking for 24 prior to surgery.   ____  4. Bring all medications with you on the day of surgery if instructed.    __x__ 5. Notify your doctor if there is any change in your medical condition     (cold, fever, infections).     Do not wear jewelry, make-up, hairpins, clips or nail polish.  Do not wear lotions, powders, or perfumes. You may wear deodorant.  Do not shave 48 hours prior to surgery. Men may shave face and neck.  Do not bring valuables to the hospital.    [facility] is not responsible for any belongings or valuables.               Contacts, dentures or bridgework may not be worn into surgery.  Leave your suitcase in the car. After surgery it may be brought to your room.  For patients admitted to the hospital, discharge time is determined by your treatment team.                         Patients discharged the day of surgery will not be allowed to drive home.  You will need someone to drive you home and stay with you the night of your procedure.    Please read over the following fact sheets that you were given:   [facility] Preparing for Surgery and or MRSA Information   _x___ Take anti-hypertensive (unless it includes a diuretic), cardiac, seizure,  asthma,     anti-reflux and psychiatric medicines with a sip of water. These include:  1.  NORVASC  2.  3.  4.  5.  6.  ____Fleets enema or Magnesium Citrate as directed.   _x___ Use CHG Soap or sage wipes as directed on instruction sheet   _x_ Use inhalers on the day of surgery and bring to hospital day of surgery (USE ADVAIR [place])  ____ Stop Metformin and Janumet 2 days prior to surgery.    ____ Take 1/2 of usual insulin dose the night before surgery and none on the morning surgery       _x___ Follow recommendations from Cardiologist, Pulmonologist or PCP regarding          stopping Aspirin, Coumadin, Pllavix ,Eliquis, Effient, or Pradaxa, and Pletal.  X____Stop Anti-inflammatories such as Advil, Aleve, Ibuprofen, Motrin, Naproxen, Naprosyn, Goodies powders or aspirin products. OK to take Tylenol  _x___ Stop supplements until after surgery.  But may continue Vitamin D, Vitamin B, and mutivitamin     .(STOP  VITAMIN E NOW)   ____ Bring C-Pap to the hospital.

## 2016-06-23 LAB — URINE CULTURE: Special Requests: NORMAL

## 2016-07-05 MED ORDER — CEFAZOLIN SODIUM-DEXTROSE 2-4 GM/100ML-% IV SOLN
2.0000 g | INTRAVENOUS | Status: AC
  Administered 2016-07-06: 2 g via INTRAVENOUS

## 2016-07-05 MED ORDER — FAMOTIDINE 20 MG PO TABS
20.0000 mg | ORAL_TABLET | Freq: Once | ORAL | Status: AC
  Administered 2016-07-06: 20 mg via ORAL

## 2016-07-05 MED ORDER — TRANEXAMIC ACID 1000 MG/10ML IV SOLN
1000.0000 mg | INTRAVENOUS | Status: AC
  Administered 2016-07-06: 1000 mg via INTRAVENOUS
  Filled 2016-07-05: qty 10

## 2016-07-05 MED ORDER — CHLORHEXIDINE GLUCONATE 4 % EX LIQD: 60.0000 mL | Freq: Once | CUTANEOUS | Status: DC

## 2016-07-06 ENCOUNTER — Inpatient Hospital Stay: Payer: Medicare Other

## 2016-07-06 ENCOUNTER — Encounter: Payer: Self-pay | Admitting: *Deleted

## 2016-07-06 ENCOUNTER — Inpatient Hospital Stay: Payer: Medicare Other | Admitting: Anesthesiology

## 2016-07-06 ENCOUNTER — Encounter
Admission: RE | Disposition: A | Discharge status: Home Health | Payer: Self-pay | Source: Ambulatory Visit | Attending: Orthopedic Surgery

## 2016-07-06 ENCOUNTER — Inpatient Hospital Stay
Admission: RE | Admit: 2016-07-06 | Discharge: 2016-07-08 | DRG: 470 | Disposition: A | Discharge status: Home Health | Payer: Medicare Other | Source: Ambulatory Visit | Attending: Orthopedic Surgery | Admitting: Orthopedic Surgery

## 2016-07-06 DIAGNOSIS — I7300 Raynaud's syndrome without gangrene: Secondary | ICD-10-CM | POA: Diagnosis present

## 2016-07-06 DIAGNOSIS — M069 Rheumatoid arthritis, unspecified: Secondary | ICD-10-CM | POA: Diagnosis present

## 2016-07-06 DIAGNOSIS — Z853 Personal history of malignant neoplasm of breast: Secondary | ICD-10-CM

## 2016-07-06 DIAGNOSIS — J449 Chronic obstructive pulmonary disease, unspecified: Secondary | ICD-10-CM | POA: Diagnosis present

## 2016-07-06 DIAGNOSIS — M1712 Unilateral primary osteoarthritis, left knee: Secondary | ICD-10-CM | POA: Diagnosis present

## 2016-07-06 DIAGNOSIS — Z96659 Presence of unspecified artificial knee joint: Secondary | ICD-10-CM

## 2016-07-06 DIAGNOSIS — E041 Nontoxic single thyroid nodule: Secondary | ICD-10-CM | POA: Diagnosis present

## 2016-07-06 DIAGNOSIS — J849 Interstitial pulmonary disease, unspecified: Secondary | ICD-10-CM | POA: Diagnosis present

## 2016-07-06 DIAGNOSIS — Z96652 Presence of left artificial knee joint: Secondary | ICD-10-CM

## 2016-07-06 HISTORY — PX: KNEE ARTHROPLASTY: SHX992

## 2016-07-06 LAB — ABO/RH: ABO/RH(D): A POS

## 2016-07-06 SURGERY — ARTHROPLASTY, KNEE, TOTAL, USING IMAGELESS COMPUTER-ASSISTED NAVIGATION
Anesthesia: General | Laterality: Left | Wound class: Clean

## 2016-07-06 MED ORDER — PROPOFOL 10 MG/ML IV BOLUS
INTRAVENOUS | Status: DC | PRN
  Administered 2016-07-06: 140 mg via INTRAVENOUS

## 2016-07-06 MED ORDER — ADULT MULTIVITAMIN W/MINERALS CH
1.0000 | ORAL_TABLET | Freq: Every day | ORAL | Status: DC
  Administered 2016-07-07 – 2016-07-08 (×2): 1 via ORAL
  Filled 2016-07-06 (×3): qty 1

## 2016-07-06 MED ORDER — FENTANYL CITRATE (PF) 100 MCG/2ML IJ SOLN
25.0000 ug | INTRAMUSCULAR | Status: AC | PRN
  Administered 2016-07-06 (×6): 25 ug via INTRAVENOUS

## 2016-07-06 MED ORDER — TRANEXAMIC ACID 1000 MG/10ML IV SOLN
1000.0000 mg | Freq: Once | INTRAVENOUS | Status: AC
  Administered 2016-07-06: 1000 mg via INTRAVENOUS
  Filled 2016-07-06: qty 10

## 2016-07-06 MED ORDER — ACETAMINOPHEN 650 MG RE SUPP: 650.0000 mg | Freq: Four times a day (QID) | RECTAL | Status: DC | PRN

## 2016-07-06 MED ORDER — SODIUM CHLORIDE 0.9 % IJ SOLN
INTRAMUSCULAR | Status: AC
  Filled 2016-07-06: qty 10

## 2016-07-06 MED ORDER — LIDOCAINE HCL (CARDIAC) 20 MG/ML IV SOLN
INTRAVENOUS | Status: DC | PRN
  Administered 2016-07-06: 80 mg via INTRAVENOUS

## 2016-07-06 MED ORDER — PHENYLEPHRINE HCL 10 MG/ML IJ SOLN
INTRAMUSCULAR | Status: DC | PRN
  Administered 2016-07-06: 100 ug via INTRAVENOUS
  Administered 2016-07-06 (×2): 50 ug via INTRAVENOUS
  Administered 2016-07-06: 200 ug via INTRAVENOUS

## 2016-07-06 MED ORDER — PHENOL 1.4 % MT LIQD: 1.0000 | OROMUCOSAL | Status: DC | PRN

## 2016-07-06 MED ORDER — FLEET ENEMA 7-19 GM/118ML RE ENEM: 1.0000 | ENEMA | Freq: Once | RECTAL | Status: DC | PRN

## 2016-07-06 MED ORDER — FENTANYL CITRATE (PF) 100 MCG/2ML IJ SOLN
INTRAMUSCULAR | Status: AC
  Filled 2016-07-06: qty 2

## 2016-07-06 MED ORDER — AMLODIPINE BESYLATE 5 MG PO TABS
2.5000 mg | ORAL_TABLET | Freq: Every day | ORAL | Status: DC
  Administered 2016-07-06 – 2016-07-08 (×3): 2.5 mg via ORAL
  Filled 2016-07-06 (×3): qty 1

## 2016-07-06 MED ORDER — DIPHENHYDRAMINE HCL 12.5 MG/5ML PO ELIX: 12.5000 mg | ORAL_SOLUTION | ORAL | Status: DC | PRN

## 2016-07-06 MED ORDER — RALOXIFENE HCL 60 MG PO TABS
60.0000 mg | ORAL_TABLET | Freq: Every evening | ORAL | Status: DC
  Administered 2016-07-07: 60 mg via ORAL
  Filled 2016-07-06 (×3): qty 1

## 2016-07-06 MED ORDER — DEXTROSE 5 % IV SOLN
2.0000 g | Freq: Four times a day (QID) | INTRAVENOUS | Status: AC
  Administered 2016-07-06 – 2016-07-07 (×4): 2 g via INTRAVENOUS
  Filled 2016-07-06 (×4): qty 2000

## 2016-07-06 MED ORDER — FENTANYL CITRATE (PF) 100 MCG/2ML IJ SOLN
INTRAMUSCULAR | Status: AC
  Administered 2016-07-06: 25 ug via INTRAVENOUS
  Filled 2016-07-06: qty 2

## 2016-07-06 MED ORDER — ACETAMINOPHEN 10 MG/ML IV SOLN
1000.0000 mg | Freq: Four times a day (QID) | INTRAVENOUS | Status: AC
  Administered 2016-07-06 – 2016-07-07 (×3): 1000 mg via INTRAVENOUS
  Filled 2016-07-06 (×4): qty 100

## 2016-07-06 MED ORDER — FAMOTIDINE 20 MG PO TABS
ORAL_TABLET | ORAL | Status: AC
  Administered 2016-07-06: 20 mg via ORAL
  Filled 2016-07-06: qty 1

## 2016-07-06 MED ORDER — ACETAMINOPHEN 10 MG/ML IV SOLN
INTRAVENOUS | Status: DC | PRN
  Administered 2016-07-06: 1000 mg via INTRAVENOUS

## 2016-07-06 MED ORDER — TRAMADOL HCL 50 MG PO TABS
50.0000 mg | ORAL_TABLET | ORAL | Status: DC | PRN
  Administered 2016-07-06: 50 mg via ORAL
  Administered 2016-07-07 – 2016-07-08 (×4): 100 mg via ORAL
  Filled 2016-07-06 (×4): qty 2
  Filled 2016-07-06: qty 1

## 2016-07-06 MED ORDER — OXYCODONE HCL 5 MG/5ML PO SOLN: 5.0000 mg | Freq: Once | ORAL | Status: DC | PRN

## 2016-07-06 MED ORDER — SODIUM CHLORIDE 0.9 % IV SOLN
INTRAVENOUS | Status: DC | PRN
  Administered 2016-07-06: 60 mL

## 2016-07-06 MED ORDER — MENTHOL 3 MG MT LOZG: 1.0000 | LOZENGE | OROMUCOSAL | Status: DC | PRN

## 2016-07-06 MED ORDER — ACETAMINOPHEN 10 MG/ML IV SOLN
INTRAVENOUS | Status: AC
  Filled 2016-07-06: qty 100

## 2016-07-06 MED ORDER — FENTANYL CITRATE (PF) 100 MCG/2ML IJ SOLN
INTRAMUSCULAR | Status: DC | PRN
  Administered 2016-07-06: 100 ug via INTRAVENOUS
  Administered 2016-07-06: 50 ug via INTRAVENOUS
  Administered 2016-07-06: 100 ug via INTRAVENOUS

## 2016-07-06 MED ORDER — METOPROLOL TARTRATE 25 MG PO TABS
25.0000 mg | ORAL_TABLET | Freq: Two times a day (BID) | ORAL | Status: DC
  Administered 2016-07-06 – 2016-07-08 (×4): 25 mg via ORAL
  Filled 2016-07-06 (×4): qty 1

## 2016-07-06 MED ORDER — RISAQUAD PO CAPS
1.0000 | ORAL_CAPSULE | Freq: Every day | ORAL | Status: DC
  Administered 2016-07-07 – 2016-07-08 (×2): 1 via ORAL
  Filled 2016-07-06 (×2): qty 1

## 2016-07-06 MED ORDER — CEFAZOLIN SODIUM-DEXTROSE 2-4 GM/100ML-% IV SOLN
INTRAVENOUS | Status: AC
  Filled 2016-07-06: qty 100

## 2016-07-06 MED ORDER — NEOMYCIN-POLYMYXIN B GU 40-200000 IR SOLN
Status: DC | PRN
  Administered 2016-07-06: 16 mL

## 2016-07-06 MED ORDER — VITAMIN B-6 50 MG PO TABS
100.0000 mg | ORAL_TABLET | Freq: Two times a day (BID) | ORAL | Status: DC
  Administered 2016-07-07 – 2016-07-08 (×2): 100 mg via ORAL
  Filled 2016-07-06 (×5): qty 2

## 2016-07-06 MED ORDER — CEFAZOLIN SODIUM-DEXTROSE 2-4 GM/100ML-% IV SOLN: 2.0000 g | Freq: Four times a day (QID) | INTRAVENOUS | Status: DC

## 2016-07-06 MED ORDER — ROCURONIUM BROMIDE 50 MG/5ML IV SOLN
INTRAVENOUS | Status: AC
  Filled 2016-07-06: qty 1

## 2016-07-06 MED ORDER — BUPIVACAINE HCL (PF) 0.25 % IJ SOLN
INTRAMUSCULAR | Status: AC
  Filled 2016-07-06: qty 60

## 2016-07-06 MED ORDER — OXYCODONE HCL 5 MG PO TABS
5.0000 mg | ORAL_TABLET | ORAL | Status: DC | PRN
  Administered 2016-07-06: 10 mg via ORAL
  Administered 2016-07-06 – 2016-07-07 (×3): 5 mg via ORAL
  Administered 2016-07-07 – 2016-07-08 (×3): 10 mg via ORAL
  Filled 2016-07-06 (×3): qty 1
  Filled 2016-07-06 (×4): qty 2

## 2016-07-06 MED ORDER — ONDANSETRON HCL 4 MG/2ML IJ SOLN
4.0000 mg | Freq: Four times a day (QID) | INTRAMUSCULAR | Status: DC | PRN
  Administered 2016-07-06: 4 mg via INTRAVENOUS
  Filled 2016-07-06: qty 2

## 2016-07-06 MED ORDER — HYPROMELLOSE (GONIOSCOPIC) 2.5 % OP SOLN: 1.0000 [drp] | Freq: Two times a day (BID) | OPHTHALMIC | Status: DC | PRN

## 2016-07-06 MED ORDER — MIDAZOLAM HCL 2 MG/2ML IJ SOLN
INTRAMUSCULAR | Status: DC | PRN
  Administered 2016-07-06: 1 mg via INTRAVENOUS

## 2016-07-06 MED ORDER — ONDANSETRON HCL 4 MG PO TABS: 4.0000 mg | ORAL_TABLET | Freq: Four times a day (QID) | ORAL | Status: DC | PRN

## 2016-07-06 MED ORDER — OXYCODONE HCL 5 MG PO TABS: 5.0000 mg | ORAL_TABLET | Freq: Once | ORAL | Status: DC | PRN

## 2016-07-06 MED ORDER — SUGAMMADEX SODIUM 200 MG/2ML IV SOLN
INTRAVENOUS | Status: AC
  Filled 2016-07-06: qty 2

## 2016-07-06 MED ORDER — PROPOFOL 10 MG/ML IV BOLUS
INTRAVENOUS | Status: AC
  Filled 2016-07-06: qty 20

## 2016-07-06 MED ORDER — ONDANSETRON HCL 4 MG/2ML IJ SOLN
INTRAMUSCULAR | Status: AC
  Filled 2016-07-06: qty 2

## 2016-07-06 MED ORDER — FLUTICASONE PROPIONATE 50 MCG/ACT NA SUSP: 1.0000 | Freq: Every day | NASAL | Status: DC | PRN

## 2016-07-06 MED ORDER — SUCCINYLCHOLINE CHLORIDE 20 MG/ML IJ SOLN
INTRAMUSCULAR | Status: AC
  Filled 2016-07-06: qty 1

## 2016-07-06 MED ORDER — ONDANSETRON HCL 4 MG/2ML IJ SOLN
INTRAMUSCULAR | Status: DC | PRN
  Administered 2016-07-06: 4 mg via INTRAVENOUS

## 2016-07-06 MED ORDER — PANTOPRAZOLE SODIUM 40 MG PO TBEC
40.0000 mg | DELAYED_RELEASE_TABLET | Freq: Two times a day (BID) | ORAL | Status: DC
  Administered 2016-07-06 – 2016-07-08 (×5): 40 mg via ORAL
  Filled 2016-07-06 (×5): qty 1

## 2016-07-06 MED ORDER — PHILLIPS COLON HEALTH PO CAPS: 1.0000 | ORAL_CAPSULE | Freq: Every day | ORAL | Status: DC

## 2016-07-06 MED ORDER — MOMETASONE FURO-FORMOTEROL FUM 200-5 MCG/ACT IN AERO
2.0000 | INHALATION_SPRAY | Freq: Two times a day (BID) | RESPIRATORY_TRACT | Status: DC
  Administered 2016-07-06 – 2016-07-08 (×4): 2 via RESPIRATORY_TRACT
  Filled 2016-07-06: qty 8.8

## 2016-07-06 MED ORDER — METOCLOPRAMIDE HCL 10 MG PO TABS
10.0000 mg | ORAL_TABLET | Freq: Three times a day (TID) | ORAL | Status: DC
  Administered 2016-07-06 – 2016-07-08 (×4): 10 mg via ORAL
  Filled 2016-07-06 (×6): qty 1

## 2016-07-06 MED ORDER — NEOMYCIN-POLYMYXIN B GU 40-200000 IR SOLN
Status: AC
  Filled 2016-07-06: qty 20

## 2016-07-06 MED ORDER — BUPIVACAINE LIPOSOME 1.3 % IJ SUSP
INTRAMUSCULAR | Status: AC
  Filled 2016-07-06: qty 20

## 2016-07-06 MED ORDER — MORPHINE SULFATE (PF) 2 MG/ML IV SOLN
2.0000 mg | INTRAVENOUS | Status: DC | PRN
  Administered 2016-07-06: 2 mg via INTRAVENOUS
  Filled 2016-07-06: qty 1

## 2016-07-06 MED ORDER — FENTANYL CITRATE (PF) 250 MCG/5ML IJ SOLN
INTRAMUSCULAR | Status: AC
  Filled 2016-07-06: qty 5

## 2016-07-06 MED ORDER — FERROUS SULFATE 325 (65 FE) MG PO TABS
325.0000 mg | ORAL_TABLET | Freq: Two times a day (BID) | ORAL | Status: DC
  Administered 2016-07-07 – 2016-07-08 (×4): 325 mg via ORAL
  Filled 2016-07-06 (×4): qty 1

## 2016-07-06 MED ORDER — SUGAMMADEX SODIUM 200 MG/2ML IV SOLN
INTRAVENOUS | Status: DC | PRN
  Administered 2016-07-06: 150 mg via INTRAVENOUS

## 2016-07-06 MED ORDER — ROCURONIUM BROMIDE 100 MG/10ML IV SOLN
INTRAVENOUS | Status: DC | PRN
  Administered 2016-07-06: 20 mg via INTRAVENOUS
  Administered 2016-07-06: 10 mg via INTRAVENOUS
  Administered 2016-07-06: 40 mg via INTRAVENOUS

## 2016-07-06 MED ORDER — GABAPENTIN 300 MG PO CAPS
300.0000 mg | ORAL_CAPSULE | Freq: Three times a day (TID) | ORAL | Status: DC
  Administered 2016-07-06 – 2016-07-08 (×7): 300 mg via ORAL
  Filled 2016-07-06 (×7): qty 1

## 2016-07-06 MED ORDER — SODIUM CHLORIDE 0.9 % IV SOLN
INTRAVENOUS | Status: DC
  Administered 2016-07-06 (×2): via INTRAVENOUS

## 2016-07-06 MED ORDER — SODIUM CHLORIDE 0.9 % IJ SOLN
INTRAMUSCULAR | Status: AC
  Filled 2016-07-06: qty 50

## 2016-07-06 MED ORDER — ALUM & MAG HYDROXIDE-SIMETH 200-200-20 MG/5ML PO SUSP: 30.0000 mL | ORAL | Status: DC | PRN

## 2016-07-06 MED ORDER — DEXAMETHASONE SODIUM PHOSPHATE 10 MG/ML IJ SOLN
INTRAMUSCULAR | Status: DC | PRN
  Administered 2016-07-06: 10 mg via INTRAVENOUS

## 2016-07-06 MED ORDER — ACETAMINOPHEN 325 MG PO TABS: 650.0000 mg | ORAL_TABLET | Freq: Four times a day (QID) | ORAL | Status: DC | PRN

## 2016-07-06 MED ORDER — SODIUM CHLORIDE 0.9 % IV SOLN
INTRAVENOUS | Status: DC | PRN
  Administered 2016-07-06: 20 ug/min via INTRAVENOUS

## 2016-07-06 MED ORDER — ENOXAPARIN SODIUM 30 MG/0.3ML ~~LOC~~ SOLN
30.0000 mg | Freq: Two times a day (BID) | SUBCUTANEOUS | Status: DC
  Administered 2016-07-07 – 2016-07-08 (×3): 30 mg via SUBCUTANEOUS
  Filled 2016-07-06 (×3): qty 0.3

## 2016-07-06 MED ORDER — ESTROGENS, CONJUGATED 0.625 MG/GM VA CREA: 0.5000 g | TOPICAL_CREAM | VAGINAL | Status: DC

## 2016-07-06 MED ORDER — MIDAZOLAM HCL 2 MG/2ML IJ SOLN
INTRAMUSCULAR | Status: AC
  Filled 2016-07-06: qty 2

## 2016-07-06 MED ORDER — BUPIVACAINE HCL (PF) 0.25 % IJ SOLN
INTRAMUSCULAR | Status: DC | PRN
  Administered 2016-07-06: 60 mL

## 2016-07-06 MED ORDER — LACTATED RINGERS IV SOLN
INTRAVENOUS | Status: DC
  Administered 2016-07-06 (×2): via INTRAVENOUS

## 2016-07-06 MED ORDER — VITAMIN E 180 MG (400 UNIT) PO CAPS
400.0000 [IU] | ORAL_CAPSULE | Freq: Two times a day (BID) | ORAL | Status: DC
  Filled 2016-07-06 (×2): qty 1

## 2016-07-06 MED ORDER — SENNOSIDES-DOCUSATE SODIUM 8.6-50 MG PO TABS
1.0000 | ORAL_TABLET | Freq: Two times a day (BID) | ORAL | Status: DC
  Administered 2016-07-06 – 2016-07-08 (×5): 1 via ORAL
  Filled 2016-07-06 (×5): qty 1

## 2016-07-06 MED ORDER — MAGNESIUM HYDROXIDE 400 MG/5ML PO SUSP
30.0000 mL | Freq: Every day | ORAL | Status: DC | PRN
  Administered 2016-07-07: 30 mL via ORAL
  Filled 2016-07-06: qty 30

## 2016-07-06 MED ORDER — DEXAMETHASONE SODIUM PHOSPHATE 10 MG/ML IJ SOLN
INTRAMUSCULAR | Status: AC
  Filled 2016-07-06: qty 1

## 2016-07-06 MED ORDER — LIDOCAINE HCL (PF) 2 % IJ SOLN
INTRAMUSCULAR | Status: AC
  Filled 2016-07-06: qty 2

## 2016-07-06 MED ORDER — BISACODYL 10 MG RE SUPP: 10.0000 mg | Freq: Every day | RECTAL | Status: DC | PRN

## 2016-07-06 MED ORDER — SEVOFLURANE IN SOLN
RESPIRATORY_TRACT | Status: AC
  Filled 2016-07-06: qty 250

## 2016-07-06 SURGICAL SUPPLY — 63 items
BATTERY INSTRU NAVIGATION (MISCELLANEOUS) ×12 IMPLANT
BLADE SAW 1 (BLADE) ×3 IMPLANT
BLADE SAW 1/2 (BLADE) ×3 IMPLANT
BLADE SAW 70X12.5 (BLADE) IMPLANT
BONE CEMENT GENTAMICIN (Cement) ×6 IMPLANT
CANISTER SUCT 1200ML W/VALVE (MISCELLANEOUS) ×3 IMPLANT
CANISTER SUCT 3000ML (MISCELLANEOUS) ×6 IMPLANT
CAPT KNEE TOTAL 3 ATTUNE ×3 IMPLANT
CATH TRAY METER 16FR LF (MISCELLANEOUS) ×3 IMPLANT
CEMENT BONE GENTAMICIN 40 (Cement) ×2 IMPLANT
COOLER POLAR GLACIER W/PUMP (MISCELLANEOUS) ×3 IMPLANT
CUFF TOURN 24 STER (MISCELLANEOUS) IMPLANT
CUFF TOURN 30 STER DUAL PORT (MISCELLANEOUS) IMPLANT
DRAPE SHEET LG 3/4 BI-LAMINATE (DRAPES) ×3 IMPLANT
DRSG DERMACEA 8X12 NADH (GAUZE/BANDAGES/DRESSINGS) ×3 IMPLANT
DRSG OPSITE POSTOP 4X14 (GAUZE/BANDAGES/DRESSINGS) ×3 IMPLANT
DRSG TEGADERM 4X4.75 (GAUZE/BANDAGES/DRESSINGS) ×3 IMPLANT
DURAPREP 26ML APPLICATOR (WOUND CARE) ×6 IMPLANT
ELECT CAUTERY BLADE 6.4 (BLADE) ×3 IMPLANT
ELECT REM PT RETURN 9FT ADLT (ELECTROSURGICAL) ×3
ELECTRODE REM PT RTRN 9FT ADLT (ELECTROSURGICAL) ×1 IMPLANT
EX-PIN ORTHOLOCK NAV 4X150 (PIN) ×6 IMPLANT
GLOVE BIOGEL M STRL SZ7.5 (GLOVE) ×6 IMPLANT
GLOVE BIOGEL PI IND STRL 9 (GLOVE) ×1 IMPLANT
GLOVE BIOGEL PI INDICATOR 9 (GLOVE) ×2
GLOVE INDICATOR 8.0 STRL GRN (GLOVE) ×3 IMPLANT
GLOVE SURG SYN 9.0  PF PI (GLOVE) ×2
GLOVE SURG SYN 9.0 PF PI (GLOVE) ×1 IMPLANT
GOWN STRL REUS W/ TWL LRG LVL3 (GOWN DISPOSABLE) ×2 IMPLANT
GOWN STRL REUS W/TWL 2XL LVL3 (GOWN DISPOSABLE) ×3 IMPLANT
GOWN STRL REUS W/TWL LRG LVL3 (GOWN DISPOSABLE) ×4
HEMOVAC 400CC 10FR (MISCELLANEOUS) ×3 IMPLANT
HOLDER FOLEY CATH W/STRAP (MISCELLANEOUS) ×3 IMPLANT
HOOD PEEL AWAY FLYTE STAYCOOL (MISCELLANEOUS) ×6 IMPLANT
KIT RM TURNOVER STRD PROC AR (KITS) ×3 IMPLANT
KNIFE SCULPS 14X20 (INSTRUMENTS) ×3 IMPLANT
LABEL OR SOLS (LABEL) ×3 IMPLANT
NDL SAFETY 18GX1.5 (NEEDLE) ×3 IMPLANT
NEEDLE SPNL 20GX3.5 QUINCKE YW (NEEDLE) ×3 IMPLANT
NS IRRIG 500ML POUR BTL (IV SOLUTION) ×3 IMPLANT
PACK TOTAL KNEE (MISCELLANEOUS) ×3 IMPLANT
PAD WRAPON POLAR KNEE (MISCELLANEOUS) ×1 IMPLANT
PIN DRILL QUICK PACK ×3 IMPLANT
PIN FIXATION 1/8DIA X 3INL (PIN) ×3 IMPLANT
PULSAVAC PLUS IRRIG FAN TIP (DISPOSABLE) ×2
SOL .9 NS 3000ML IRR  AL (IV SOLUTION) ×2
SOL .9 NS 3000ML IRR UROMATIC (IV SOLUTION) ×1 IMPLANT
SOL PREP PVP 2OZ (MISCELLANEOUS) ×3
SOLUTION PREP PVP 2OZ (MISCELLANEOUS) ×1 IMPLANT
SPONGE DRAIN TRACH 4X4 STRL 2S (GAUZE/BANDAGES/DRESSINGS) ×3 IMPLANT
STAPLER SKIN PROX 35W (STAPLE) ×3 IMPLANT
STRAP TIBIA SHORT (MISCELLANEOUS) ×3 IMPLANT
SUCTION FRAZIER HANDLE 10FR (MISCELLANEOUS) ×2
SUCTION TUBE FRAZIER 10FR DISP (MISCELLANEOUS) ×1 IMPLANT
SUT VIC AB 0 CT1 36 (SUTURE) ×3 IMPLANT
SUT VIC AB 1 CT1 36 (SUTURE) ×6 IMPLANT
SUT VIC AB 2-0 CT2 27 (SUTURE) ×3 IMPLANT
SYR 20CC LL (SYRINGE) ×3 IMPLANT
SYR 30ML LL (SYRINGE) ×6 IMPLANT
TIP FAN IRRIG PULSAVAC PLUS (DISPOSABLE) ×1 IMPLANT
TOWEL OR 17X26 4PK STRL BLUE (TOWEL DISPOSABLE) ×3 IMPLANT
TOWER CARTRIDGE SMART MIX (DISPOSABLE) ×3 IMPLANT
WRAPON POLAR PAD KNEE (MISCELLANEOUS) ×3

## 2016-07-06 NOTE — Anesthesia Procedure Notes (Signed)
Procedure Name: Intubation Date/Time: 07/06/2016 7:27 AM Performed by: Hedda Slade Pre-anesthesia Checklist: Patient identified, Patient being monitored, Timeout performed, Emergency Drugs available and Suction available Patient Re-evaluated:Patient Re-evaluated prior to inductionOxygen Delivery Method: Circle system utilized Preoxygenation: Pre-oxygenation with 100% oxygen Intubation Type: IV induction Ventilation: Mask ventilation without difficulty Laryngoscope Size: Mac and 3 Grade View: Grade I Tube type: Oral Tube size: 7.0 mm Number of attempts: 1 Airway Equipment and Method: Stylet Placement Confirmation: ETT inserted through vocal cords under direct vision,  positive ETCO2 and breath sounds checked- equal and bilateral Secured at: 21 cm Tube secured with: Tape Dental Injury: Teeth and Oropharynx as per pre-operative assessment

## 2016-07-06 NOTE — Progress Notes (Signed)
Patient was admitted from OR to room 157. Foley, foot pumps, TEDs, cyro cuff, and hemovac in place. IV fluids started. Patient c/o of pain 7/10 and being nauseated. Gave PRN meds. Family at bedside. Bed alarm on for safety.

## 2016-07-06 NOTE — Transfer of Care (Signed)
Immediate Anesthesia Transfer of Care Note  Patient: Eileen Maldonado  Procedure(s) Performed: Procedure(s): COMPUTER ASSISTED TOTAL KNEE ARTHROPLASTY (Left)  Patient Location: PACU  Anesthesia Type:General  Level of Consciousness: sedated  Airway & Oxygen Therapy: Patient Spontanous Breathing and Patient connected to face mask oxygen  Post-op Assessment: Report given to RN and Post -op Vital signs reviewed and stable  Post vital signs: Reviewed and stable  Last Vitals:  Vitals:   07/06/16 0615 07/06/16 1110  BP: (!) 174/100 (!) 155/91  Pulse: 88 100  Resp: 16 (!) 21  Temp: 36.1 C 36.4 C    Last Pain:  Vitals:   07/06/16 0615  TempSrc: Oral         Complications: No apparent anesthesia complications

## 2016-07-06 NOTE — Op Note (Signed)
OPERATIVE NOTE  DATE OF SURGERY:  07/06/2016  PATIENT NAME:  Eileen Maldonado   DOB: Jun 16, 1945  MRN: [id number]  PRE-OPERATIVE DIAGNOSIS: Degenerative arthrosis of the left knee, primary (superimposed on rheumatoid arthritis)  POST-OPERATIVE DIAGNOSIS:  Same  PROCEDURE:  Left total knee arthroplasty using computer-assisted navigation  SURGEON:  Marciano Sequin. M.D.  ASSISTANT:  Vance Peper, PA (present and scrubbed throughout the case, critical for assistance with exposure, retraction, instrumentation, and closure)  ANESTHESIA: general  ESTIMATED BLOOD LOSS: 50 mL  FLUIDS REPLACED: 1200 mL of crystalloid  TOURNIQUET TIME: 99 minutes  DRAINS: 2 medium drains to a reinfusion system  SOFT TISSUE RELEASES: Anterior cruciate ligament, posterior cruciate ligament, deep medial collateral ligament, patellofemoral ligament  IMPLANTS UTILIZED: DePuy Attune size 6N posterior stabilized femoral component (cemented), size 5 rotating platform tibial component (cemented), 38 mm medialized dome patella (cemented), and a 6 mm stabilized rotating platform polyethylene insert.  INDICATIONS FOR SURGERY: Eileen Maldonado is a 71 y.o. year old female with a long history of progressive knee pain. X-rays demonstrated severe degenerative changes in tricompartmental fashion. The patient had not seen any significant improvement despite conservative nonsurgical intervention. After discussion of the risks and benefits of surgical intervention, the patient expressed understanding of the risks benefits and agree with plans for total knee arthroplasty.   The risks, benefits, and alternatives were discussed at length including but not limited to the risks of infection, bleeding, nerve injury, stiffness, blood clots, the need for revision surgery, cardiopulmonary complications, among others, and they were willing to proceed.  PROCEDURE IN DETAIL: The patient was brought into the operating room and, after  adequate general anesthesia was achieved, a tourniquet was placed on the patient's upper thigh. The patient's knee and leg were cleaned and prepped with alcohol and DuraPrep and draped in the usual sterile fashion. A "timeout" was performed as per usual protocol. The lower extremity was exsanguinated using an Esmarch, and the tourniquet was inflated to 300 mmHg. An anterior longitudinal incision was made followed by a standard mid vastus approach. The deep fibers of the medial collateral ligament were elevated in a subperiosteal fashion off of the medial flare of the tibia so as to maintain a continuous soft tissue sleeve. The patella was subluxed laterally and the patellofemoral ligament was incised. Inspection of the knee demonstrated severe degenerative changes with full-thickness loss of articular cartilage. Osteophytes were debrided using a rongeur. Anterior and posterior cruciate ligaments were excised. Two 4.0 mm Schanz pins were inserted in the femur and into the tibia for attachment of the array of trackers used for computer-assisted navigation. Hip center was identified using a circumduction technique. Distal landmarks were mapped using the computer. The distal femur and proximal tibia were mapped using the computer. The distal femoral cutting guide was positioned using computer-assisted navigation so as to achieve a 5 distal valgus cut. The femur was sized and it was felt that a size 6N femoral component was appropriate. A size 6 femoral cutting guide was positioned and the anterior cut was performed and verified using the computer. This was followed by completion of the posterior and chamfer cuts. Femoral cutting guide for the central box was then positioned in the center box cut was performed.  Attention was then directed to the proximal tibia. Medial and lateral menisci were excised. The extramedullary tibial cutting guide was positioned using computer-assisted navigation so as to achieve a 0  varus-valgus alignment and 3 posterior slope. The cut was performed and  verified using the computer. The proximal tibia was sized and it was felt that a size 5 tibial tray was appropriate. Tibial and femoral trials were inserted followed by insertion of a 6 mm polyethylene insert. This allowed for excellent mediolateral soft tissue balancing both in flexion and in full extension. Finally, the patella was cut and prepared so as to accommodate a 38 mm medialized dome patella. A patella trial was placed and the knee was placed through a range of motion with excellent patellar tracking appreciated. The femoral trial was removed after debridement of posterior osteophytes. The central post-hole for the tibial component was reamed followed by insertion of a keel punch. Tibial trials were then removed. Cut surfaces of bone were irrigated with copious amounts of normal saline with antibiotic solution using pulsatile lavage and then suctioned dry. Polymethylmethacrylate cement with gentamicin was prepared in the usual fashion using a vacuum mixer. Cement was applied to the cut surface of the proximal tibia as well as along the undersurface of a size 5 rotating platform tibial component. Tibial component was positioned and impacted into place. Excess cement was removed using [employer] fast streamer. Cement was then applied to the cut surfaces of the femur as well as along the posterior flanges of the size 6N femoral component. The femoral component was positioned and impacted into place. Excess cement was removed using [employer] fast streamer. A 6 mm polyethylene trial was inserted and the knee was brought into full extension with steady axial compression applied. Finally, cement was applied to the backside of a 38 mm medialized dome patella and the patellar component was positioned and patellar clamp applied. Excess cement was removed using [employer] fast streamer. After adequate curing of the cement, the tourniquet was deflated after a total  tourniquet time of 99 minutes. Hemostasis was achieved using electrocautery. The knee was irrigated with copious amounts of normal saline with antibiotic solution using pulsatile lavage and then suctioned dry. 20 mL of 1.3% Exparel and 60 mL of 0.25% Marcaine in 40 mL of normal saline was injected along the posterior capsule, medial and lateral gutters, and along the arthrotomy site. A 6 mm stabilized rotating platform polyethylene insert was inserted and the knee was placed through a range of motion with excellent mediolateral soft tissue balancing appreciated and excellent patellar tracking noted. 2 medium drains were placed in the wound bed and brought out through separate stab incisions to be attached to a reinfusion system. The medial parapatellar portion of the incision was reapproximated using interrupted sutures of #1 Vicryl. Subcutaneous tissue was approximated in layers using first #0 Vicryl followed #2-0 Vicryl. The skin was approximated with skin staples. A sterile dressing was applied.  The patient tolerated the procedure well and was transported to the recovery room in stable condition.    Haneef Hallquist P. Holley Bouche., M.D.

## 2016-07-06 NOTE — Anesthesia Preprocedure Evaluation (Addendum)
Anesthesia Evaluation  Patient identified by MRN, date of birth, ID band Patient awake    Reviewed: Allergy & Precautions, H&P , NPO status , Patient's Chart, lab work & pertinent test results  History of Anesthesia Complications Negative for: history of anesthetic complications  Airway Mallampati: III  TM Distance: >3 FB Neck ROM: full    Dental  (+) Chipped, Caps   Pulmonary neg shortness of breath, COPD,    Pulmonary exam normal breath sounds clear to auscultation       Cardiovascular Exercise Tolerance: Good (-) angina+ Peripheral Vascular Disease  (-) Past MI and (-) DOE negative cardio ROS Normal cardiovascular exam Rhythm:regular Rate:Normal     Neuro/Psych negative neurological ROS  negative psych ROS   GI/Hepatic negative GI ROS, Neg liver ROS, neg GERD  ,  Endo/Other  negative endocrine ROS  Renal/GU      Musculoskeletal  (+) Arthritis ,   Abdominal   Peds  Hematology negative hematology ROS (+)   Anesthesia Other Findings Past Medical History: No date: Breast cancer (HCC)     Comment: Left Breast No date: Collagen vascular disease (HCC) No date: Hives No date: ILD (interstitial lung disease) (HCC) No date: PVD (peripheral vascular disease) (HCC) No date: RA (rheumatoid arthritis) (HCC) No date: Raynaud's phenomenon No date: Right thyroid nodule  Past Surgical History: No date: appy No date: breast biopsy No date: BREAST SURGERY No date: hernia repair No date: HERNIA REPAIR     Comment: Umbilical Hernia  BMI    Body Mass Index:  27.81 kg/m      Reproductive/Obstetrics negative OB ROS                            Anesthesia Physical Anesthesia Plan  ASA: III  Anesthesia Plan: General ETT   Post-op Pain Management:    Induction: Intravenous  Airway Management Planned:   Additional Equipment:   Intra-op Plan:   Post-operative Plan:   Informed  Consent: I have reviewed the patients History and Physical, chart, labs and discussed the procedure including the risks, benefits and alternatives for the proposed anesthesia with the patient or authorized representative who has indicated his/her understanding and acceptance.   Dental Advisory Given  Plan Discussed with: Anesthesiologist, CRNA and Surgeon  Anesthesia Plan Comments: (Patient has spinal stenosis with symptoms and requests GA)       Anesthesia Quick Evaluation

## 2016-07-06 NOTE — H&P (Signed)
The patient has been re-examined, and the chart reviewed, and there have been no interval changes to the documented history and physical.    The risks, benefits, and alternatives have been discussed at length. The patient expressed understanding of the risks benefits and agreed with plans for surgical intervention.  Tonna Palazzi P. Braelee Herrle, Jr. M.D.    

## 2016-07-06 NOTE — Evaluation (Addendum)
Physical Therapy Evaluation Patient Details Name: Eileen Maldonado MRN: [id number] DOB: [birth year] Today's Date: 07/06/2016   History of Present Illness  P admitted for L TKR.  Clinical Impression  Pt is a pleasant 71 year old female who was admitted for L TKR. Pt performs bed mobility with min assist, transfers with cga, and ambulation with cga and RW. Pt demonstrates deficits with strength/mobility/ROM. Pt appears very motivated to perform therapy. Pt is able to perform 10 SLR with out assist, does not require KI at this time. Would benefit from skilled PT to address above deficits and promote optimal return to PLOF. Recommend transition to [place] upon discharge from acute hospitalization.       Follow Up Recommendations Home health PT    Equipment Recommendations  Rolling walker with 5" wheels    Recommendations for Other Services       Precautions / Restrictions Precautions Precautions: Fall;Knee Precaution Booklet Issued: No Restrictions Weight Bearing Restrictions: Yes LLE Weight Bearing: Weight bearing as tolerated      Mobility  Bed Mobility Overal bed mobility: Needs Assistance Bed Mobility: Supine to Sit     Supine to sit: Min assist     General bed mobility comments: assist for sliding B LEs off bed and sitting at EOB. Needs cues to dig hands down into bed to promote scooting. Once seated upright, slight dizziness noted, however resolved quickly.  Transfers Overall transfer level: Needs assistance Equipment used: Rolling walker (2 wheeled) Transfers: Sit to/from Stand Sit to Stand: Min guard         General transfer comment: safe technique performed with RW and upright posture. Pt follows commands well and demonstrates safe technique  Ambulation/Gait Ambulation/Gait assistance: Min guard Ambulation Distance (Feet): 3 Feet Assistive device: Rolling walker (2 wheeled) Gait Pattern/deviations: Step-to pattern     General Gait Details: ambulated to  recliner with safe technique. Has difficulty with sequencing of RW, needs cues for correct technique  Stairs            Wheelchair Mobility    Modified Rankin (Stroke Patients Only)       Balance Overall balance assessment: Needs assistance Sitting-balance support: Feet supported Sitting balance-Leahy Scale: Good     Standing balance support: Bilateral upper extremity supported Standing balance-Leahy Scale: Good                               Pertinent Vitals/Pain Pain Assessment: 0-10 Pain Score: 7  Pain Location: L knee Pain Descriptors / Indicators: Operative site guarding Pain Intervention(s): Limited activity within patient's tolerance;Premedicated before session;Ice applied    Home Living Family/patient expects to be discharged to:: Private residence Living Arrangements: Spouse/significant other Available Help at Discharge: Family Type of Home: House Home Access: Stairs to enter Entrance Stairs-Rails: None Entrance Stairs-Number of Steps: 1 Home Layout:  (has basement; however pt can stay on main level) Home Equipment: None      Prior Function Level of Independence: Independent         Comments: just got diagnosed with RA, limiting prior mobility     Hand Dominance        Extremity/Trunk Assessment   Upper Extremity Assessment Upper Extremity Assessment: Overall WFL for tasks assessed    Lower Extremity Assessment Lower Extremity Assessment: Generalized weakness (L LE grossly 3/5)       Communication   Communication: No difficulties  Cognition Arousal/Alertness: Awake/alert Behavior During Therapy: WFL for tasks  assessed/performed Overall Cognitive Status: Within Functional Limits for tasks assessed                                        General Comments      Exercises Total Joint Exercises Goniometric ROM: L knee AAROM: 6-65 degrees Other Exercises Other Exercises: Pt performed supine ther-ex  including ankle pumps, quad sets, SLRs, and hip abd/add. All ther-ex performed x 10 reps with cues and correct technique   Assessment/Plan    PT Assessment Patient needs continued PT services  PT Problem List Decreased strength;Decreased range of motion;Decreased mobility;Decreased balance;Decreased knowledge of use of DME;Pain       PT Treatment Interventions Gait training;DME instruction;Therapeutic exercise    PT Goals (Current goals can be found in the Care Plan section)  Acute Rehab PT Goals Patient Stated Goal: to go home PT Goal Formulation: With patient Time For Goal Achievement: 07/20/16 Potential to Achieve Goals: Good    Frequency BID   Barriers to discharge        Co-evaluation               End of Session Equipment Utilized During Treatment: Gait belt Activity Tolerance: Patient tolerated treatment well Patient left: in chair;with chair alarm set;with SCD's reapplied Nurse Communication: Mobility status PT Visit Diagnosis: Muscle weakness (generalized) (M62.81);Pain Pain - Right/Left: Left Pain - part of body: Knee    Time: 1430-1502 PT Time Calculation (min) (ACUTE ONLY): 32 min   Charges:   PT Evaluation $PT Eval Moderate Complexity: 1 Procedure PT Treatments $Therapeutic Exercise: 8-22 mins   PT G Codes:        Greggory Stallion, PT, DPT [phone]   [name] 07/06/2016, 4:15 PM

## 2016-07-06 NOTE — Anesthesia Postprocedure Evaluation (Signed)
Anesthesia Post Note  Patient: Eileen Maldonado  Procedure(s) Performed: Procedure(s) (LRB): COMPUTER ASSISTED TOTAL KNEE ARTHROPLASTY (Left)  Patient location during evaluation: PACU Anesthesia Type: General Level of consciousness: awake and alert Pain management: pain level controlled Vital Signs Assessment: post-procedure vital signs reviewed and stable Respiratory status: spontaneous breathing, nonlabored ventilation, respiratory function stable and patient connected to nasal cannula oxygen Cardiovascular status: blood pressure returned to baseline and stable Postop Assessment: no signs of nausea or vomiting Anesthetic complications: no     Last Vitals:  Vitals:   07/06/16 1301 07/06/16 1352  BP: (!) 168/90 (!) 157/90  Pulse: 99 94  Resp: 14 18  Temp: 36.7 C 36.8 C    Last Pain:  Vitals:   07/06/16 1352  TempSrc: Oral  PainSc:                  [name]

## 2016-07-06 NOTE — Anesthesia Post-op Follow-up Note (Cosign Needed)
Anesthesia QCDR form completed.        

## 2016-07-06 NOTE — Progress Notes (Addendum)
Trending higher than normal BP, paged Dr. Marry Guan in inform him. Spoke with patient and she stated that her BP at home is usually in the 150's/70's. Stated that she hasn't followed up with PCP about her BP values.  Spoke with Dr. Roland Rack, new orders received.

## 2016-07-07 LAB — CBC
HCT: 33.1 % — ABNORMAL LOW (ref 35.0–47.0)
Hemoglobin: 10.8 g/dL — ABNORMAL LOW (ref 12.0–16.0)
MCH: 28.8 pg (ref 26.0–34.0)
MCHC: 32.6 g/dL (ref 32.0–36.0)
MCV: 88.2 fL (ref 80.0–100.0)
Platelets: 149 10*3/uL — ABNORMAL LOW (ref 150–440)
RBC: 3.76 MIL/uL — AB (ref 3.80–5.20)
RDW: 14.9 % — ABNORMAL HIGH (ref 11.5–14.5)
WBC: 9.2 10*3/uL (ref 3.6–11.0)

## 2016-07-07 LAB — BASIC METABOLIC PANEL
ANION GAP: 6 (ref 5–15)
BUN: 12 mg/dL (ref 6–20)
CHLORIDE: 108 mmol/L (ref 101–111)
CO2: 26 mmol/L (ref 22–32)
Calcium: 8.4 mg/dL — ABNORMAL LOW (ref 8.9–10.3)
Creatinine, Ser: 0.71 mg/dL (ref 0.44–1.00)
GFR calc Af Amer: >60 mL/min (ref >60)
GLUCOSE: 121 mg/dL — AB (ref 65–99)
POTASSIUM: 4.2 mmol/L (ref 3.5–5.1)
Sodium: 140 mmol/L (ref 135–145)

## 2016-07-07 NOTE — NC FL2 (Signed)
[place] LEVEL OF CARE SCREENING TOOL     IDENTIFICATION  Patient Name: Eileen Maldonado Birthdate: Jun 17, 1945 Sex: female Admission Date (Current Location): 07/06/2016  [place] and [place] Number:  Engineering geologist and Address:  [facility], [address], [place], [place] [address]      Provider Number: [id number]  Attending Physician Name and Address:  Dereck Leep, MD  Relative Name and Phone Number:       Current Level of Care: Hospital Recommended Level of Care: [place] Prior Approval Number:    Date Approved/Denied:   PASRR Number:  [id number])  Discharge Plan: SNF    Current Diagnoses: Patient Active Problem List   Diagnosis Date Noted  . S/P total knee arthroplasty 07/06/2016  . Ileitis 01/31/2016  . Rheumatoid arthritis ([place]) 07/28/2014    Orientation RESPIRATION BLADDER Height & Weight     Self, Time, Situation, Place  Normal Continent Weight: 157 lb (71.2 kg) Height:  5\' 3"  (160 cm)  BEHAVIORAL SYMPTOMS/MOOD NEUROLOGICAL BOWEL NUTRITION STATUS   (None. )  (None. ) Continent Diet (Diet: Regular)  AMBULATORY STATUS COMMUNICATION OF NEEDS Skin   Extensive Assist Verbally Surgical wounds (Incision: Left Knee)                       Personal Care Assistance Level of Assistance  Bathing, Feeding, Dressing Bathing Assistance: Limited assistance Feeding assistance: Independent Dressing Assistance: Limited assistance     Functional Limitations Info  Sight, Hearing, Speech Sight Info: Adequate Hearing Info: Adequate Speech Info: Adequate    SPECIAL CARE FACTORS FREQUENCY  PT (By licensed PT), OT (By licensed OT)     PT Frequency:  (5) OT Frequency:  (5)            Contractures      Additional Factors Info  Code Status, Allergies Code Status Info:  (Full Code) Allergies Info:  (No Known Allergies)           Current Medications (07/07/2016):  This is the current  hospital active medication list Current Facility-Administered Medications  Medication Dose Route Frequency Provider Last Rate Last Dose  . 0.9 %  sodium chloride infusion   Intravenous Continuous Dereck Leep, MD 100 mL/hr at 07/06/16 2337    . acetaminophen (OFIRMEV) IV 1,000 mg  1,000 mg Intravenous Q6H Dereck Leep, MD   1,000 mg at 07/07/16 0322  . acetaminophen (TYLENOL) tablet 650 mg  650 mg Oral Q6H PRN Dereck Leep, MD       Or  . acetaminophen (TYLENOL) suppository 650 mg  650 mg Rectal Q6H PRN Dereck Leep, MD      . acidophilus (RISAQUAD) capsule 1 capsule  1 capsule Oral Daily Dereck Leep, MD   1 capsule at 07/07/16 0839  . alum & mag hydroxide-simeth (MAALOX/MYLANTA) 200-200-20 MG/5ML suspension 30 mL  30 mL Oral Q4H PRN Dereck Leep, MD      . amLODipine (NORVASC) tablet 2.5 mg  2.5 mg Oral Daily Dereck Leep, MD   2.5 mg at 07/07/16 0840  . bisacodyl (DULCOLAX) suppository 10 mg  10 mg Rectal Daily PRN Dereck Leep, MD      . ceFAZolin (ANCEF) 2 g in dextrose 5 % 100 mL IVPB  2 g Intravenous Q6H Dereck Leep, MD   2 g at 07/07/16 0839  . conjugated estrogens (PREMARIN) vaginal cream 7.59 Applicatorful  0.5 g Vaginal  Once per day on Mon Thu Dereck Leep, MD      . diphenhydrAMINE (BENADRYL) 12.5 MG/5ML elixir 12.5-25 mg  12.5-25 mg Oral Q4H PRN Dereck Leep, MD      . enoxaparin (LOVENOX) injection 30 mg  30 mg Subcutaneous Q12H Dereck Leep, MD   30 mg at 07/07/16 0839  . ferrous sulfate tablet 325 mg  325 mg Oral BID WC Dereck Leep, MD   325 mg at 07/07/16 0840  . fluticasone (FLONASE) 50 MCG/ACT nasal spray 1 spray  1 spray Each Nare Daily PRN Dereck Leep, MD      . gabapentin (NEURONTIN) capsule 300 mg  300 mg Oral TID Dereck Leep, MD   300 mg at 07/07/16 0840  . hydroxypropyl methylcellulose / hypromellose (ISOPTO TEARS / GONIOVISC) 2.5 % ophthalmic solution 1 drop  1 drop Both Eyes BID PRN Dereck Leep, MD      . magnesium hydroxide (MILK OF  MAGNESIA) suspension 30 mL  30 mL Oral Daily PRN Dereck Leep, MD      . menthol-cetylpyridinium (CEPACOL) lozenge 3 mg  1 lozenge Oral PRN Dereck Leep, MD       Or  . phenol (CHLORASEPTIC) mouth spray 1 spray  1 spray Mouth/Throat PRN Dereck Leep, MD      . metoCLOPramide (REGLAN) tablet 10 mg  10 mg Oral TID AC & HS Dereck Leep, MD   10 mg at 07/07/16 0840  . metoprolol tartrate (LOPRESSOR) tablet 25 mg  25 mg Oral BID Corky Mull, MD   25 mg at 07/07/16 0840  . mometasone-formoterol (DULERA) 200-5 MCG/ACT inhaler 2 puff  2 puff Inhalation BID Dereck Leep, MD   2 puff at 07/07/16 0841  . morphine 2 MG/ML injection 2 mg  2 mg Intravenous Q2H PRN Dereck Leep, MD   2 mg at 07/06/16 1317  . multivitamin with minerals tablet 1 tablet  1 tablet Oral Daily Dereck Leep, MD   1 tablet at 07/07/16 0839  . ondansetron (ZOFRAN) tablet 4 mg  4 mg Oral Q6H PRN Dereck Leep, MD       Or  . ondansetron (ZOFRAN) injection 4 mg  4 mg Intravenous Q6H PRN Dereck Leep, MD   4 mg at 07/06/16 1317  . oxyCODONE (Oxy IR/ROXICODONE) immediate release tablet 5-10 mg  5-10 mg Oral Q4H PRN Dereck Leep, MD   10 mg at 07/06/16 2253  . pantoprazole (PROTONIX) EC tablet 40 mg  40 mg Oral BID Dereck Leep, MD   40 mg at 07/07/16 0839  . pyridOXINE (VITAMIN B-6) tablet 100 mg  100 mg Oral BID Dereck Leep, MD   100 mg at 07/07/16 0841  . raloxifene (EVISTA) tablet 60 mg  60 mg Oral QPM Dereck Leep, MD      . senna-docusate (Senokot-S) tablet 1 tablet  1 tablet Oral BID Dereck Leep, MD   1 tablet at 07/07/16 0840  . sodium phosphate (FLEET) 7-19 GM/118ML enema 1 enema  1 enema Rectal Once PRN Dereck Leep, MD      . traMADol Veatrice Bourbon) tablet 50-100 mg  50-100 mg Oral Q4H PRN Dereck Leep, MD   100 mg at 07/07/16 0616  . vitamin E capsule 400 Units  400 Units Oral BID Dereck Leep, MD   400 Units at 07/07/16 0840     Discharge Medications: Please  see discharge summary for a list of  discharge medications.  Relevant Imaging Results:  Relevant Lab Results:   Additional Information  (SSN: [id number])  Danie Chandler, Student-Social Work

## 2016-07-07 NOTE — Progress Notes (Signed)
Physical Therapy Treatment Patient Details Name: Eileen Maldonado MRN: [id number] DOB: July 12, 1945 Today's Date: 07/07/2016    History of Present Illness P admitted for L TKR, POD#1 for OT evaluation. PMHx significant for Raynaud's, rheumatoid arthritis, PVD, collagen vascular disease, breast cancer (L), and pt reports during OT evaluation having spinal stenosis.    PT Comments    Pt agreeable to PT; reports increased pain/feeling stiff from sitting in chair. Educated pt on calling for assist to lower chair, as she is unable herself, so that she does not get too stiff. Also educated pt on bending knee upward with heel slide actively and active assisted with use of hands, which pt was able to demonstrate. Encouraged pt not to sit in suffering, but manage with movement. Pt notes decreased pain once moving/stretching was accomplished. Pt ambulates to/from bathroom with rolling walker and min guard. Pt requires Min guard and cues for increased use of Left lower extremity during transfers for strength/stretch opportunity and improved balance. Pt returned to bed post session. Continue PT to progress range, strength, endurance and balance to improve all functional mobility.   Follow Up Recommendations  Home health PT     Equipment Recommendations  Rolling walker with 5" wheels    Recommendations for Other Services       Precautions / Restrictions Precautions Precautions: Fall;Knee Restrictions Weight Bearing Restrictions: Yes LLE Weight Bearing: Weight bearing as tolerated    Mobility  Bed Mobility Overal bed mobility: Needs Assistance Bed Mobility: Sit to Supine       Sit to supine: Min assist   General bed mobility comments: Assist for LEs   Transfers Overall transfer level: Needs assistance Equipment used: Rolling walker (2 wheeled) Transfers: Sit to/from Stand Sit to Stand: Min guard         General transfer comment: Cues for increased use of LLE with transfer.  Transfers performed at recliner, commode and bed.   Ambulation/Gait Ambulation/Gait assistance: Min guard Ambulation Distance (Feet): 28 Feet (performed 2x) Assistive device: Rolling walker (2 wheeled) Gait Pattern/deviations: Step-through pattern Gait velocity: reduced Gait velocity interpretation: Below normal speed for age/gender General Gait Details: cues for keeping rw further ahead and not stepping too far into rw; inconsistent correction. No LOB    Stairs            Wheelchair Mobility    Modified Rankin (Stroke Patients Only)       Balance Overall balance assessment: Needs assistance Sitting-balance support: Feet supported Sitting balance-Leahy Scale: Good     Standing balance support: Bilateral upper extremity supported Standing balance-Leahy Scale: Fair (fair+)                              Cognition Arousal/Alertness: Awake/alert Behavior During Therapy: WFL for tasks assessed/performed Overall Cognitive Status: Within Functional Limits for tasks assessed                                        Exercises Total Joint Exercises Quad Sets: Strengthening;Both;20 reps;Supine Knee Flexion: AROM;Left;10 reps;Seated;Other (comment) (3 positions each rep with 10 sec hold each)    General Comments        Pertinent Vitals/Pain Pain Assessment: Faces Faces Pain Scale: Hurts little more Pain Location: L knee Pain Intervention(s): Monitored during session;Ice applied;Repositioned    Home Living  Prior Function            PT Goals (current goals can now be found in the care plan section) Progress towards PT goals: Progressing toward goals    Frequency    BID      PT Plan Current plan remains appropriate    Co-evaluation             End of Session Equipment Utilized During Treatment: Gait belt Activity Tolerance: Patient tolerated treatment well Patient left: in bed;with call  bell/phone within reach;with bed alarm set;with SCD's reapplied;Other (comment) (polar care in place) Nurse Communication: Mobility status PT Visit Diagnosis: Muscle weakness (generalized) (M62.81);Pain Pain - Right/Left: Left Pain - part of body: Knee     Time: 1027-2536 PT Time Calculation (min) (ACUTE ONLY): 36 min  Charges:  $Gait Training: 8-22 mins $Therapeutic Exercise: 8-22 mins                    G Codes:        Larae Grooms, PTA 07/07/2016, 5:03 PM

## 2016-07-07 NOTE — Progress Notes (Signed)
Clinical Social Worker (CSW) received SNF consult. PT is recommending home health. RN case manager aware of above. Please reconsult if future social work needs arise. CSW signing off.   Eileen Citro, LCSW [phone] 

## 2016-07-07 NOTE — Care Management Note (Addendum)
Case Management Note  Patient Details  Name: Eileen Maldonado MRN: [id number] Date of Birth: [birth year]  Subjective/Objective:  POD # 1 left THA. Met with patient at bedside to discuss discharge planning. Patient anxious about going home. She lives with her spouse. Will need a walker and bedside commode. Ordered from [facility]. Discussed home health and she is in agreement. Offered choice. Referral to [facility] for SN, PT, OT and HHA. New BP meds. Pharmacy: [facility]. [phone]. Called Lovenox 40 mg SQ # 14 no refills.   PCP is [place] [facility]                 Action/Plan: [facility] for PT, Lovenox called in, Walker and bsc ordered.   Expected Discharge Date:  07/08/16               Expected Discharge Plan:  [place]  In-House Referral:     Discharge planning Services  CM Consult  Post Acute Care Choice:  Durable Medical Equipment, Home Health Choice offered to:  Patient  DME Arranged:  3-N-1, Walker rolling DME Agency:  [place]: RN,  PT, OT and HHA [place] Agency:  [facility] (now [facility])  Status of Service:  In process, will continue to follow  If discussed at Long Length of Stay Meetings, dates discussed:    Additional Comments:  Jolly Mango, RN 07/07/2016, 11:44 AM

## 2016-07-07 NOTE — Progress Notes (Signed)
Patient is A&O x4. Up with assist x1 and [employer]. Up in chair for periods of the day. Urinating in bathroom. Gave oral pain meds with good relief. Drain in place. NSL. Tolerated a regular diet. Started Lovenox teaching. Bed alarm on for safety.

## 2016-07-07 NOTE — Progress Notes (Signed)
   Subjective: 1 Day Post-Op Procedure(s) (LRB): COMPUTER ASSISTED TOTAL KNEE ARTHROPLASTY (Left) Patient reports pain as moderate.   Patient is well, and has had no acute complaints or problems We will start therapy today.  Plan is to go Home after hospital stay. no nausea and no vomiting Patient denies any chest pains or shortness of breath. Pain being controlled. Denies any shortness of breath. Resting well.  Objective: Vital signs in last 24 hours: Temp:  [97.6 F (36.4 C)-98.7 F (37.1 C)] 98.6 F (37 C) (04/12 0008) Pulse Rate:  [80-100] 87 (04/12 0008) Resp:  [11-21] 19 (04/12 0008) BP: (142-178)/(80-91) 163/84 (04/12 0008) SpO2:  [97 %-100 %] 98 % (04/12 0008) Heels are non tender and elevated off the bed using rolled towels as well as bone foam under the operative leg.  Intake/Output from previous day: 04/11 0701 - 04/12 0700 In: 2653.3 [P.O.:220; I.V.:2333.3; IV Piggyback:100] Out: 1224 [Urine:3040; Drains:230; Blood:50] Intake/Output this shift: No intake/output data recorded.   Recent Labs  07/07/16 0514  HGB 10.8*    Recent Labs  07/07/16 0514  WBC 9.2  RBC 3.76*  HCT 33.1*  PLT 149*    Recent Labs  07/07/16 0514  NA 140  K 4.2  CL 108  CO2 26  BUN 12  CREATININE 0.71  GLUCOSE 121*  CALCIUM 8.4*   No results for input(s): LABPT, INR in the last 72 hours.  EXAM General - Patient is Alert, Appropriate and Oriented Extremity - Neurologically intact Neurovascular intact Sensation intact distally Intact pulses distally Dorsiflexion/Plantar flexion intact No cellulitis present Compartment soft Dressing - dressing C/D/I Motor Function - intact, moving foot and toes well on exam. Able to do straight leg raises on her own.  Past Medical History:  Diagnosis Date  . Breast cancer ([place])    Left Breast  . Collagen vascular disease ([place])   . Hives   . ILD (interstitial lung disease) ([place])   . PVD (peripheral vascular disease) ([place])   .  RA (rheumatoid arthritis) ([place])   . Raynaud's phenomenon   . Right thyroid nodule     Assessment/Plan: 1 Day Post-Op Procedure(s) (LRB): COMPUTER ASSISTED TOTAL KNEE ARTHROPLASTY (Left) Active Problems:   S/P total knee arthroplasty  Estimated body mass index is 27.81 kg/m as calculated from the following:   Height as of this encounter: 5\' 3"  (1.6 m).   Weight as of this encounter: 71.2 kg (157 lb). Advance diet Up with therapy D/C IV fluids Plan for discharge tomorrow Discharge home with home health  Labs: Were reviewed and acceptable DVT Prophylaxis - Lovenox, Foot Pumps and TED hose Weight-Bearing as tolerated to left leg D/C O2 and Pulse OX and try on Room Air Begin working on bowel movement today. Labs tomorrow morning  Jillyn Ledger. [place] 07/07/2016, 7:21 AM

## 2016-07-07 NOTE — Discharge Instructions (Signed)
° °TOTAL KNEE REPLACEMENT POSTOPERATIVE DIRECTIONS ° °Knee Rehabilitation, Guidelines Following Surgery  °Results after knee surgery are often greatly improved when you follow the exercise, range of motion and muscle strengthening exercises prescribed by your doctor. Safety measures are also important to protect the knee from further injury. Any time any of these exercises cause you to have increased pain or swelling in your knee joint, decrease the amount until you are comfortable again and slowly increase them. If you have problems or questions, call your caregiver or physical therapist for advice.  ° °HOME CARE INSTRUCTIONS  °Remove items at home which could result in a fall. This includes throw rugs or furniture in walking pathways.  °· Continue to use polar care unit on the knee for pain and swelling from surgery. You may notice swelling that will progress down to the foot and ankle.  This is normal after surgery.  Elevate the leg when you are not up walking on it.   °· Continue to use the breathing machine which will help keep your temperature down.  It is common for your temperature to cycle up and down following surgery, especially at night when you are not up moving around and exerting yourself.  The breathing machine keeps your lungs expanded and your temperature down. °· Do not place pillow under knee, focus on keeping the knee STRAIGHT while resting ° °DIET °You may resume your previous home diet once your are discharged from the hospital. ° °DRESSING / WOUND CARE / SHOWERING °You may start showering once staples have been removed. Change the surgical dressing as needed. ° °STAPLE REMOVAL: °Staples are to be removed at two weeks post op and apply benzoin and 1/2 inch steri strips ° °ACTIVITY °Walk with your walker as instructed. °Use walker as long as suggested by your caregivers. °Avoid periods of inactivity such as sitting longer than an hour when not asleep. This helps prevent blood clots.  °You may  resume a sexual relationship in one month or when given the OK by your doctor.  °You may return to work once you are cleared by your doctor.  °Do not drive a car for 6 weeks or until released by you surgeon.  °Do not drive while taking narcotics. ° °WEIGHT BEARING °Weight bearing as tolerated with assist device (walker, cane, etc) as directed, use it as long as suggested by your surgeon or therapist, typically at least 4-6 weeks. ° °POSTOPERATIVE CONSTIPATION PROTOCOL °Constipation - defined medically as fewer than three stools per week and severe constipation as less than one stool per week. ° °One of the most common issues patients have following surgery is constipation.  Even if you have a regular bowel pattern at home, your normal regimen is likely to be disrupted due to multiple reasons following surgery.  Combination of anesthesia, postoperative narcotics, change in appetite and fluid intake all can affect your bowels.  In order to avoid complications following surgery, here are some recommendations in order to help you during your recovery period. ° °Colace (docusate) - Pick up an over-the-counter form of Colace or another stool softener and take twice a day as long as you are requiring postoperative pain medications.  Take with a full glass of water daily.  If you experience loose stools or diarrhea, hold the colace until you stool forms back up.  If your symptoms do not get better within 1 week or if they get worse, check with your doctor. ° °Dulcolax (bisacodyl) - Pick up over-the-counter and   take as directed by the product packaging as needed to assist with the movement of your bowels.  Take with a full glass of water.  Use this product as needed if not relieved by Colace only.   MiraLax (polyethylene glycol) - Pick up over-the-counter to have on hand.  MiraLax is a solution that will increase the amount of water in your bowels to assist with bowel movements.  Take as directed and can mix with a glass  of water, juice, soda, coffee, or tea.  Take if you go more than two days without a movement. Do not use MiraLax more than once per day. Call your doctor if you are still constipated or irregular after using this medication for 7 days in a row.  If you continue to have problems with postoperative constipation, please contact the office for further assistance and recommendations.  If you experience "the worst abdominal pain ever" or develop nausea or vomiting, please contact the office immediatly for further recommendations for treatment.  ITCHING  If you experience itching with your medications, try taking only a single pain pill, or even half a pain pill at a time.  You can also use Benadryl over the counter for itching or also to help with sleep.    TED HOSE STOCKINGS Wear the elastic stockings on both legs for  6 weeks following surgery during the day but you may remove then at night for sleeping.  MEDICATIONS You may begin taking your rheumatoid arthritic medication 1 week after surgery  See your medication summary on the After Visit Summary that the nursing staff will review with you prior to discharge.  You may have some home medications which will be placed on hold until you complete the course of blood thinner medication.  It is important for you to complete the blood thinner medication as prescribed by your surgeon.  Continue your approved medications as instructed at time of discharge.  PRECAUTIONS If you experience chest pain or shortness of breath - call 911 immediately for transfer to the hospital emergency department.  If you develop a fever greater that 101 F, purulent drainage from wound, increased redness or drainage from wound, foul odor from the wound/dressing, or calf pain - CONTACT YOUR SURGEON.                                                   FOLLOW-UP APPOINTMENTS Make sure you keep all of your appointments after your operation with your surgeon and caregivers. You  should call the office at the above phone number and make an appointment for approximately two weeks after the date of your surgery or on the date instructed by your surgeon outlined in the "After Visit Summary".   RANGE OF MOTION AND STRENGTHENING EXERCISES  Rehabilitation of the knee is important following a knee injury or an operation. After just a few days of immobilization, the muscles of the thigh which control the knee become weakened and shrink (atrophy). Knee exercises are designed to build up the tone and strength of the thigh muscles and to improve knee motion. Often times heat used for twenty to thirty minutes before working out will loosen up your tissues and help with improving the range of motion but do not use heat for the first two weeks following surgery. These exercises can be done on a training (  exercise) mat, on the floor, on a table or on a bed. Use what ever works the best and is most comfortable for you Knee exercises include:  Leg Lifts - While your knee is still immobilized in a splint or cast, you can do straight leg raises. Lift the leg to 60 degrees, hold for 3 sec, and slowly lower the leg. Repeat 10-20 times 2-3 times daily. Perform this exercise against resistance later as your knee gets better.  Quad and Hamstring Sets - Tighten up the muscle on the front of the thigh (Quad) and hold for 5-10 sec. Repeat this 10-20 times hourly. Hamstring sets are done by pushing the foot backward against an object and holding for 5-10 sec. Repeat as with quad sets.   Leg Slides: Lying on your back, slowly slide your foot toward your buttocks, bending your knee up off the floor (only go as far as is comfortable). Then slowly slide your foot back down until your leg is flat on the floor again.  Angel Wings: Lying on your back spread your legs to the side as far apart as you can without causing discomfort.  A rehabilitation program following serious knee injuries can speed recovery and  prevent re-injury in the future due to weakened muscles. Contact your doctor or a physical therapist for more information on knee rehabilitation.   IF YOU ARE TRANSFERRED TO A SKILLED REHAB FACILITY If the patient is transferred to a skilled rehab facility following release from the hospital, a list of the current medications will be sent to the facility for the patient to continue.  When discharged from the skilled rehab facility, please have the facility set up the patient's [place] prior to being released. Also, the skilled facility will be responsible for providing the patient with their medications at time of release from the facility to include their pain medication, the muscle relaxants, and their blood thinner medication. If the patient is still at the rehab facility at time of the two week follow up appointment, the skilled rehab facility will also need to assist the patient in arranging follow up appointment in our office and any transportation needs.  MAKE SURE YOU:  Understand these instructions.  Get help right away if you are not doing well or get worse.

## 2016-07-07 NOTE — Discharge Summary (Signed)
Physician Discharge Summary  Patient ID: Eileen Maldonado MRN: [id number] DOB/AGE: 72-Mar-1947 72 y.o.  Admit date: 07/06/2016 Discharge date: 07/08/2016  Admission Diagnoses:  PRIMARY OSTEOARTHRITIS OF LEFT KNEE   Discharge Diagnoses: Patient Active Problem List   Diagnosis Date Noted  . S/P total knee arthroplasty 07/06/2016  . Ileitis 01/31/2016  . Rheumatoid arthritis ([place]) 07/28/2014    Past Medical History:  Diagnosis Date  . Breast cancer ([place])    Left Breast  . Collagen vascular disease ([place])   . Hives   . ILD (interstitial lung disease) ([place])   . PVD (peripheral vascular disease) ([place])   . RA (rheumatoid arthritis) ([place])   . Raynaud's phenomenon   . Right thyroid nodule      Transfusion: No transfusions given doing this admission   Consultants (if any):  none  Discharged Condition: Improved  Hospital Course: Eileen Maldonado is an 71 y.o. female who was admitted 07/06/2016 with a diagnosis of degenerative arthrosis left knee and went to the operating room on 07/06/2016 and underwent the above named procedures.    Surgeries:Procedure(s): COMPUTER ASSISTED TOTAL KNEE ARTHROPLASTY on 07/06/2016  PRE-OPERATIVE DIAGNOSIS: Degenerative arthrosis of the left knee, primary (superimposed on rheumatoid arthritis)  POST-OPERATIVE DIAGNOSIS:  Same  PROCEDURE:  Left total knee arthroplasty using computer-assisted navigation  SURGEON:  Marciano Sequin. M.D.  ASSISTANT:  Vance Peper, PA (present and scrubbed throughout the case, critical for assistance with exposure, retraction, instrumentation, and closure)  ANESTHESIA: general  ESTIMATED BLOOD LOSS: 50 mL  FLUIDS REPLACED: 1200 mL of crystalloid  TOURNIQUET TIME: 99 minutes  DRAINS: 2 medium drains to a reinfusion system  SOFT TISSUE RELEASES: Anterior cruciate ligament, posterior cruciate ligament, deep medial collateral ligament, patellofemoral ligament  IMPLANTS UTILIZED: DePuy Attune  size 6N posterior stabilized femoral component (cemented), size 5 rotating platform tibial component (cemented), 38 mm medialized dome patella (cemented), and a 6 mm stabilized rotating platform polyethylene insert.  INDICATIONS FOR SURGERY: Anyely Cunning Maldonado is a 71 y.o. year old female with a long history of progressive knee pain. X-rays demonstrated severe degenerative changes in tricompartmental fashion. The patient had not seen any significant improvement despite conservative nonsurgical intervention. After discussion of the risks and benefits of surgical intervention, the patient expressed understanding of the risks benefits and agree with plans for total knee arthroplasty.   The risks, benefits, and alternatives were discussed at length including but not limited to the risks of infection, bleeding, nerve injury, stiffness, blood clots, the need for revision surgery, cardiopulmonary complications, among others, and they were willing to proceed.  Patient tolerated the surgery well. No complications .Patient was taken to PACU where she was stabilized and then transferred to the orthopedic floor.  Patient started on Lovenox 30 mg q 12 hrs. Foot pumps applied bilaterally at 80 mm hgb. Heels elevated off bed with rolled towels. No evidence of DVT. Calves non tender. Negative [place]. Physical therapy started on day #1 for gait training and transfer with OT starting on  day #1 for ADL and assisted devices. Patient has done well with therapy. Ambulated greater than 200 feet upon being discharged. Was able to ascend and descend 4 steps safely and independently  Patient's IV and Foley were discontinued on day #1 with Hemovac being discontinued on day #2. Dressing to the surgical leg was changed on day 2 prior to being discharged   She was given perioperative antibiotics:  Anti-infectives    Start     Dose/Rate Route Frequency  Ordered Stop   07/06/16 1500  ceFAZolin (ANCEF) 2 g in dextrose 5 % 100 mL  IVPB     2 g 240 mL/hr over 30 Minutes Intravenous Every 6 hours 07/06/16 1305 07/07/16 1459   07/06/16 1300  ceFAZolin (ANCEF) IVPB 2g/100 mL premix  Status:  Discontinued     2 g 200 mL/hr over 30 Minutes Intravenous Every 6 hours 07/06/16 1255 07/06/16 1305   07/06/16 0600  ceFAZolin (ANCEF) IVPB 2g/100 mL premix     2 g 200 mL/hr over 30 Minutes Intravenous On call to O.R. 07/05/16 2139 07/06/16 0733   07/06/16 0557  ceFAZolin (ANCEF) 2-4 GM/100ML-% IVPB    Comments:  LEWIS, CINDY: cabinet override      07/06/16 0557 07/06/16 0733    .  She fitted with AV 1 compression foot pump devices, instructed on heel pumps, as well as early ambulation, and TED stockings bilaterally for DVT prophylaxis.  She benefited maximally from the hospital stay and there were no complications.    Recent vital signs:  Vitals:   07/06/16 1923 07/07/16 0008  BP: (!) 178/89 (!) 163/84  Pulse: 96 87  Resp: 19 19  Temp: 98.1 F (36.7 C) 98.6 F (37 C)    Recent laboratory studies:  Lab Results  Component Value Date   HGB 10.8 (L) 07/07/2016   HGB 13.3 06/22/2016   HGB 11.3 (L) 02/01/2016   Lab Results  Component Value Date   WBC 9.2 07/07/2016   PLT 149 (L) 07/07/2016   Lab Results  Component Value Date   INR 1.11 06/22/2016   Lab Results  Component Value Date   NA 140 07/07/2016   K 4.2 07/07/2016   CL 108 07/07/2016   CO2 26 07/07/2016   BUN 12 07/07/2016   CREATININE 0.71 07/07/2016   GLUCOSE 121 (H) 07/07/2016    Discharge Medications:   Allergies as of 07/08/2016      Reactions   Celebrex [celecoxib] Other (See Comments)   Hair loss and cough   Methotrexate Derivatives Other (See Comments)   Hair loss and cough      Medication List    TAKE these medications   amLODipine 2.5 MG tablet Commonly known as:  NORVASC Take 2.5 mg by mouth daily.   conjugated estrogens vaginal cream Commonly known as:  PREMARIN Place 0.5 g vaginally 2 (two) times a week.   enoxaparin  30 MG/0.3ML injection Commonly known as:  LOVENOX Inject 0.4 mLs (40 mg total) into the skin daily.   etanercept 50 MG/ML injection Commonly known as:  ENBREL Inject 1 mL into the skin once a week. Mondays Will stop 2 weeks prior to procedure   fluticasone 50 MCG/ACT nasal spray Commonly known as:  FLONASE Place 1 spray into both nostrils daily as needed for allergies or rhinitis.   Fluticasone-Salmeterol 250-50 MCG/DOSE Aepb Commonly known as:  ADVAIR Inhale 1 puff into the lungs 2 (two) times daily as needed (congestion).   gabapentin 300 MG capsule Commonly known as:  NEURONTIN Take 300 mg by mouth 3 (three) times daily.   hydroxypropyl methylcellulose / hypromellose 2.5 % ophthalmic solution Commonly known as:  ISOPTO TEARS / GONIOVISC Place 1 drop into both eyes 2 (two) times daily as needed for dry eyes.   leflunomide 10 MG tablet Commonly known as:  ARAVA Take 10 mg by mouth daily.   MUCINEX D MAX STRENGTH PO Take 1 tablet by mouth 2 (two) times daily.   multivitamin with  minerals Tabs tablet Take 1 tablet by mouth daily.   naproxen 500 MG tablet Commonly known as:  NAPROSYN TAKE 1 TABLET (500 MG TOTAL) BY MOUTH 2 (TWO) TIMES DAILY WITH MEALS.   oxyCODONE 5 MG immediate release tablet Commonly known as:  Oxy IR/ROXICODONE Take 1-2 tablets (5-10 mg total) by mouth every 4 (four) hours as needed for severe pain or breakthrough pain.   PHILLIPS COLON HEALTH Caps Take 1 capsule by mouth daily.   pyridoxine 100 MG tablet Commonly known as:  B-6 Take 100 mg by mouth 2 (two) times daily.   raloxifene 60 MG tablet Commonly known as:  EVISTA Take 60 mg by mouth every evening.   traMADol 50 MG tablet Commonly known as:  ULTRAM Take 1-2 tablets (50-100 mg total) by mouth every 4 (four) hours as needed for moderate pain.   vitamin E 400 UNIT capsule Take 400 Units by mouth 2 (two) times daily.            Durable Medical Equipment        Start      Ordered   07/06/16 1256  DME Walker rolling  Once    Question:  Patient needs a walker to treat with the following condition  Answer:  Total knee replacement status   07/06/16 1255   07/06/16 1256  DME Bedside commode  Once    Question:  Patient needs a bedside commode to treat with the following condition  Answer:  Total knee replacement status   07/06/16 1255      Diagnostic Studies: Dg Knee Left Port  Result Date: 07/06/2016 CLINICAL DATA:  Status post left knee replacement EXAM: PORTABLE LEFT KNEE - 2 VIEW COMPARISON:  None. FINDINGS: Left knee prosthesis is seen in satisfactory position. No acute bony abnormality is noted. Defects are noted in the distal femur and proximal tibia consistent with the computer system at deviation. IMPRESSION: Status post left knee replacement Electronically Signed   By: Inez Catalina M.D.   On: 07/06/2016 11:55    Disposition: 01-Home or [place] On 07/21/2016.   Specialty:  Physician Assistant Why:  at 9:45am Contact information: [place] [address] [phone]        Dereck Leep, MD On 08/18/2016.   Specialty:  Orthopedic Surgery Why:  at 9:45am Contact information: [place] [address] [phone]            Signed: Watt Climes 07/07/2016, 7:31 AM

## 2016-07-07 NOTE — Progress Notes (Addendum)
Physical Therapy Treatment Patient Details Name: Eileen Maldonado MRN: [id number] DOB: 07/13/1945 Today's Date: 07/07/2016    History of Present Illness P admitted for L TKR, POD#1 for OT evaluation. PMHx significant for Raynaud's, rheumatoid arthritis, PVD, collagen vascular disease, breast cancer ([place]), and pt reports during OT evaluation having spinal stenosis.    PT Comments    Pt is making improved progress towards goals with increased ambulation distance this date. No complaints of dizziness this date. Pt with good tolerance for HEP and AAROM. Will continue to progress as able.   Follow Up Recommendations  Home health PT     Equipment Recommendations  Rolling walker with 5" wheels    Recommendations for Other Services       Precautions / Restrictions Precautions Precautions: Fall;Knee Precaution Booklet Issued: No Restrictions Weight Bearing Restrictions: Yes LLE Weight Bearing: Weight bearing as tolerated    Mobility  Bed Mobility               General bed mobility comments: did not assess- pt up in recliner at start of session  Transfers Overall transfer level: Needs assistance Equipment used: Rolling walker (2 wheeled) Transfers: Sit to/from Stand Sit to Stand: Min guard;Supervision         General transfer comment: initial min guard decreasing to close SBA/supervision for functional transfers with verbal cues for hand placement to maximize safety  Ambulation/Gait Ambulation/Gait assistance: Min guard Ambulation Distance (Feet): 90 Feet Assistive device: Rolling walker (2 wheeled) Gait Pattern/deviations: Step-through pattern     General Gait Details: able to progress to reciprocal gait pattern this date, however inconsistent. Pt also needs cues to look forward instead of down at feet. Cues for upright posture as well. Follows commands well   Financial trader Rankin (Stroke Patients Only)        Balance                                            Cognition Arousal/Alertness: Awake/alert Behavior During Therapy: WFL for tasks assessed/performed Overall Cognitive Status: Within Functional Limits for tasks assessed                                        Exercises Total Joint Exercises Goniometric ROM: L knee AAROM: 1-75 degrees Other Exercises Other Exercises: Seated ther-ex performed including ankle pumps, quad sets, SLRs, hip abd/add, seated knee flexion stretches, and SAQ. ALl ther-ex performed x 10 reps with cga and cues.    General Comments        Pertinent Vitals/Pain Pain Assessment: 0-10 Pain Score: 7  Pain Location: L knee Pain Descriptors / Indicators: Operative site guarding Pain Intervention(s): Limited activity within patient's tolerance;Monitored during session;Premedicated before session;Repositioned;Ice applied    Home Living Family/patient expects to be discharged to:: Private residence Living Arrangements: Spouse/significant other Available Help at Discharge: Family;Available 24 hours/day Type of Home: House Home Access: Stairs to enter Entrance Stairs-Rails: None Home Layout: Laundry or work area in basement;Able to live on main level with bedroom/bathroom (extra freezer/fridge in basement as well as computer/work area which she uses daily) Home Equipment: Grab bars - tub/shower;Shower seat - built in Additional Comments: encouraged pt to start using walk  in shower versus tub to support energy conservation, falls prevention; shower seat and grab bars in walk in shower    Prior Function Level of Independence: Independent      Comments: just got diagnosed with RA, limiting prior mobility; was indep with all ADL, IADL including driving, enjoys cooking, caring for small dog   PT Goals (current goals can now be found in the care plan section) Acute Rehab PT Goals Patient Stated Goal: to go home PT Goal Formulation:  With patient Time For Goal Achievement: 07/20/16 Potential to Achieve Goals: Good Progress towards PT goals: Progressing toward goals    Frequency    BID      PT Plan Current plan remains appropriate    Co-evaluation             End of Session Equipment Utilized During Treatment: Gait belt Activity Tolerance: Patient tolerated treatment well Patient left: in chair;with chair alarm set;with SCD's reapplied Nurse Communication: Mobility status PT Visit Diagnosis: Muscle weakness (generalized) (M62.81);Pain Pain - Right/Left: Left Pain - part of body: Knee     Time: 5277-8242 PT Time Calculation (min) (ACUTE ONLY): 28 min  Charges:  $Gait Training: 8-22 mins $Therapeutic Exercise: 8-22 mins                    G Codes:       Greggory Stallion, PT, DPT [phone]    Chrisanna Mishra 07/07/2016, 11:34 AM

## 2016-07-07 NOTE — Evaluation (Signed)
Occupational Therapy Evaluation Patient Details Name: Eileen Maldonado MRN: [id number] DOB: 1945-06-22 Today's Date: 07/07/2016    History of Present Illness P admitted for L TKR, POD#1 for OT evaluation. PMHx significant for Raynaud's, rheumatoid arthritis, PVD, collagen vascular disease, breast cancer ([place]), and pt reports during OT evaluation having spinal stenosis.   Clinical Impression   Pt seen for OT evaluation this date, POD#1. Pt was independent at baseline, slightly self limiting due to RA, but independent with ADL, IADL including driving and is eager to return to PLOF. Pt presents with L knee pain, decreased ROM/strength, activity tolerance, and increased need for assist with self care. Pt will benefit from skilled OT services to address noted impairments and functional deficits including edu/training in AE for ADL, energy conservation strategies, and home/routines modifications to support maximal return to PLOF, functional independence, and falls prevention.     Follow Up Recommendations  Home health OT    Equipment Recommendations  3 in 1 bedside commode;Other (comment) (reacher, sock aid, anti-slid tub tape for shower floor)    Recommendations for Other Services       Precautions / Restrictions Precautions Precautions: Fall;Knee Precaution Booklet Issued: No Restrictions Weight Bearing Restrictions: Yes LLE Weight Bearing: Weight bearing as tolerated      Mobility Bed Mobility               General bed mobility comments: did not assess- pt up in recliner at start of session  Transfers Overall transfer level: Needs assistance Equipment used: Rolling walker (2 wheeled) Transfers: Sit to/from Stand Sit to Stand: Min guard;Supervision         General transfer comment: initial min guard decreasing to close SBA/supervision for functional transfers with verbal cues for hand placement to maximize safety    Balance Overall balance assessment: Needs  assistance Sitting-balance support: Feet supported Sitting balance-Leahy Scale: Good     Standing balance support: During functional activity;No upper extremity supported Standing balance-Leahy Scale: Good Standing balance comment: stood at sink to wash hands with no UE support                            ADL either performed or assessed with clinical judgement   ADL Overall ADL's : Needs assistance/impaired Eating/Feeding: Sitting;Set up   Grooming: Standing;Supervision/safety;Wash/dry hands Grooming Details (indicate cue type and reason): standing at sink, good balance Upper Body Bathing: Sitting;Set up;Supervision/ safety   Lower Body Bathing: Minimal assistance;Sitting/lateral leans;Sit to/from stand   Upper Body Dressing : Sitting;Set up   Lower Body Dressing: Set up;Supervision/safety;Cueing for compensatory techniques;With adaptive equipment;Sit to/from stand Lower Body Dressing Details (indicate cue type and reason): pt educated in AE for LB dressing tasks, pt able to demonstrate understanding with good safe technique; additional time for threading sock over sock aid due to RA in hands but pt able to do it without assist Toilet Transfer: Min guard;Comfort height toilet;RW Toilet Transfer Details (indicate cue type and reason): verbal cues for hand placement to maximize safety Toileting- Clothing Manipulation and Hygiene: Sitting/lateral lean;Independent       Functional mobility during ADLs: Supervision/safety;Min guard;Rolling walker General ADL Comments: generally sup-min assist for LB ADL     Vision Baseline Vision/History: Wears glasses Wears Glasses: At all times Patient Visual Report: No change from baseline Vision Assessment?: No apparent visual deficits     Perception     Praxis Praxis Praxis tested?: Within functional limits    Pertinent Vitals/Pain Pain  Assessment: 0-10 Pain Score: 7  Pain Location: L knee Pain Descriptors / Indicators:  Operative site guarding Pain Intervention(s): Limited activity within patient's tolerance;Monitored during session;Premedicated before session;Repositioned;Ice applied     Hand Dominance     Extremity/Trunk Assessment Upper Extremity Assessment Upper Extremity Assessment: Overall WFL for tasks assessed   Lower Extremity Assessment Lower Extremity Assessment: Defer to PT evaluation;LLE deficits/detail       Communication Communication Communication: No difficulties   Cognition Arousal/Alertness: Awake/alert Behavior During Therapy: WFL for tasks assessed/performed Overall Cognitive Status: Within Functional Limits for tasks assessed                                     General Comments       Exercises Other Exercises Other Exercises: pt educated in energy conservation strategies including body/environmental positioning and home/routines modifications to maximize safety, falls prevention, and functional independence with handouts provided   Shoulder Instructions      Home Living Family/patient expects to be discharged to:: Private residence Living Arrangements: Spouse/significant other Available Help at Discharge: Family;Available 24 hours/day Type of Home: House Home Access: Stairs to enter [employer] of Steps: 2 Entrance Stairs-Rails: None Home Layout: Laundry or work area in basement;Able to live on main level with bedroom/bathroom (extra freezer/fridge in basement as well as computer/work area which she uses daily)     Bathroom Shower/Tub: Gaffer;Tub/shower unit   Bathroom Toilet: Standard Bathroom Accessibility: Yes How Accessible: Accessible via walker;Other (comment) (pt reports she will need to side step with RW to get through doorways) Home Equipment: Grab bars - tub/shower;Shower seat - built in   Additional Comments: encouraged pt to start using walk in shower versus tub to support energy conservation, falls prevention;  shower seat and grab bars in walk in shower      Prior Functioning/Environment Level of Independence: Independent        Comments: just got diagnosed with RA, limiting prior mobility; was indep with all ADL, IADL including driving, enjoys cooking, caring for small dog        OT Problem List: Decreased strength;Pain;Decreased range of motion;Decreased activity tolerance;Decreased safety awareness;Decreased knowledge of use of DME or AE      OT Treatment/Interventions: Self-care/ADL training;DME and/or AE instruction;Patient/family education;Energy conservation    OT Goals(Current goals can be found in the care plan section) Acute Rehab OT Goals Patient Stated Goal: to go home OT Goal Formulation: With patient Time For Goal Achievement: 07/21/16 Potential to Achieve Goals: Good ADL Goals Pt Will Perform Lower Body Dressing: with set-up;with supervision;sit to/from stand;with adaptive equipment Pt Will Transfer to Toilet: with supervision;ambulating (comfort height toilet)  OT Frequency: Min 1X/week   Barriers to D/C:            Co-evaluation              End of Session Equipment Utilized During Treatment: Gait belt;Rolling walker  Activity Tolerance: Patient tolerated treatment well Patient left: in chair;with call bell/phone within reach;with chair alarm set;with SCD's reapplied;Other (comment) (polar care in place)  OT Visit Diagnosis: Other abnormalities of gait and mobility (R26.89);Muscle weakness (generalized) (M62.81);Pain Pain - Right/Left: Left Pain - part of body: Knee                Time: 1610-9604 OT Time Calculation (min): 53 min Charges:  OT General Charges $OT Visit: 1 Procedure OT Evaluation $OT Eval Low Complexity: 1  Procedure OT Treatments $Self Care/Home Management : 38-52 mins G-Codes:     Jeni Salles, MPH, MS, OTR/L ascom [phone] 07/07/16, 11:40 AM

## 2016-07-08 LAB — BASIC METABOLIC PANEL
ANION GAP: 5 (ref 5–15)
BUN: 15 mg/dL (ref 6–20)
CALCIUM: 8.6 mg/dL — AB (ref 8.9–10.3)
CHLORIDE: 107 mmol/L (ref 101–111)
CO2: 28 mmol/L (ref 22–32)
Creatinine, Ser: 0.61 mg/dL (ref 0.44–1.00)
GFR calc Af Amer: >60 mL/min (ref >60)
GFR calc non Af Amer: >60 mL/min (ref >60)
GLUCOSE: 98 mg/dL (ref 65–99)
Potassium: 3.8 mmol/L (ref 3.5–5.1)
Sodium: 140 mmol/L (ref 135–145)

## 2016-07-08 LAB — CBC
HEMATOCRIT: 35.3 % (ref 35.0–47.0)
HEMOGLOBIN: 11.7 g/dL — AB (ref 12.0–16.0)
MCH: 29.1 pg (ref 26.0–34.0)
MCHC: 33.1 g/dL (ref 32.0–36.0)
MCV: 88.1 fL (ref 80.0–100.0)
Platelets: 125 10*3/uL — ABNORMAL LOW (ref 150–440)
RBC: 4.01 MIL/uL (ref 3.80–5.20)
RDW: 14.6 % — AB (ref 11.5–14.5)
WBC: 10.1 10*3/uL (ref 3.6–11.0)

## 2016-07-08 MED ORDER — ENOXAPARIN SODIUM 30 MG/0.3ML ~~LOC~~ SOLN: 40.0000 mg | SUBCUTANEOUS | 0 refills | Status: DC

## 2016-07-08 MED ORDER — TRAMADOL HCL 50 MG PO TABS: 50.0000 mg | ORAL_TABLET | ORAL | 0 refills | Status: DC | PRN

## 2016-07-08 MED ORDER — OXYCODONE HCL 5 MG PO TABS: 5.0000 mg | ORAL_TABLET | ORAL | 0 refills | Status: DC | PRN

## 2016-07-08 NOTE — Care Management Important Message (Signed)
Important Message  Patient Details  Name: LASHA ECHEVERRIA MRN: [id number] Date of Birth: 1945/12/16   Medicare Important Message Given:  Yes    Jolly Mango, RN 07/08/2016, 9:03 AM

## 2016-07-08 NOTE — Progress Notes (Signed)
Physical Therapy Treatment Patient Details Name: Eileen Maldonado MRN: [id number] DOB: [birth year] Today's Date: 07/08/2016    History of Present Illness P admitted for L TKR, POD#1 for OT evaluation. PMHx significant for Raynaud's, rheumatoid arthritis, PVD, collagen vascular disease, breast cancer (L), and pt reports during OT evaluation having spinal stenosis.    PT Comments    Pt is demonstrating good progress towards goals with ability to ambulate in hallway this date with close chair follow for recliner. Stair training performed x 2 reps with improved technique on 2nd attempt. HEP reviewed and pt able to ask all questions. Pt ready for home discharge.   Follow Up Recommendations  Home health PT     Equipment Recommendations  Rolling walker with 5" wheels    Recommendations for Other Services       Precautions / Restrictions Precautions Precautions: Fall;Knee Precaution Booklet Issued: Yes (comment) Restrictions Weight Bearing Restrictions: Yes LLE Weight Bearing: Weight bearing as tolerated    Mobility  Bed Mobility Overal bed mobility: Needs Assistance Bed Mobility: Sit to Supine     Supine to sit: Min guard     General bed mobility comments: cues to hook leg under surgical leg and up onto bed. Pt then able to scoot up toward [facility] with safe technique  Transfers Overall transfer level: Needs assistance Equipment used: Rolling walker (2 wheeled) Transfers: Sit to/from Stand Sit to Stand: Supervision         General transfer comment: Cues for pushing from seated surface. once standing, able to stand with upright posture  Ambulation/Gait Ambulation/Gait assistance: Min guard Ambulation Distance (Feet): 125 Feet Assistive device: Rolling walker (2 wheeled) Gait Pattern/deviations: Step-through pattern     General Gait Details: cues for upright posture and increased step on R LE. 1 seated rest break required this date.   Stairs Stairs: Yes   Stair  Management: No rails Number of Stairs: 2 General stair comments: Pt performed stair training x 2 with safe technique. PT demonstrated prior to performance. 1st attempt with +2 assist, 2nd attempt with safe technique.  Wheelchair Mobility    Modified Rankin (Stroke Patients Only)       Balance                                            Cognition Arousal/Alertness: Awake/alert Behavior During Therapy: WFL for tasks assessed/performed Overall Cognitive Status: Within Functional Limits for tasks assessed                                        Exercises Other Exercises Other Exercises: Supine ther-ex performed on L LE including ankle pumps, SLRs, hip ab/add, and SAQ. All ther-ex performed x 15 reps with cga. Safe technique performed    General Comments        Pertinent Vitals/Pain Pain Assessment: 0-10 Pain Score: 5  Pain Location: L knee Pain Descriptors / Indicators: Operative site guarding Pain Intervention(s): Limited activity within patient's tolerance;Ice applied    Home Living                      Prior Function            PT Goals (current goals can now be found in the care plan section) Acute Rehab PT  Goals Patient Stated Goal: to go home PT Goal Formulation: With patient Time For Goal Achievement: 07/20/16 Potential to Achieve Goals: Good Progress towards PT goals: Progressing toward goals    Frequency    BID      PT Plan Current plan remains appropriate    Co-evaluation             End of Session Equipment Utilized During Treatment: Gait belt Activity Tolerance: Patient tolerated treatment well Patient left: in bed;with bed alarm set Nurse Communication: Mobility status PT Visit Diagnosis: Muscle weakness (generalized) (M62.81);Pain Pain - Right/Left: Left Pain - part of body: Knee     Time: 6047-9987 PT Time Calculation (min) (ACUTE ONLY): 40 min  Charges:  $Gait Training: 23-37  mins $Therapeutic Exercise: 8-22 mins                    G Codes:       Greggory Stallion, PT, DPT [phone]    Corayma Cashatt 07/08/2016, 4:42 PM

## 2016-07-08 NOTE — Plan of Care (Signed)
Problem: Activity: Goal: Ability to avoid complications of mobility impairment will improve Outcome: Progressing Patient able to walk around nurses station with moderate pain, free from injury.   [name], RN

## 2016-07-08 NOTE — Progress Notes (Signed)
   Subjective: 2 Days Post-Op Procedure(s) (LRB): COMPUTER ASSISTED TOTAL KNEE ARTHROPLASTY (Left) Patient reports pain as 7 on 0-10 scale.   Patient is well, and has had no acute complaints or problems Plan is to go Home after hospital stay. no nausea and no vomiting Patient denies any chest pains or shortness of breath. Objective: Vital signs in last 24 hours: Temp:  [98.1 F (36.7 C)-98.2 F (36.8 C)] 98.1 F (36.7 C) (04/12 2318) Pulse Rate:  [73-80] 75 (04/12 2318) Resp:  [18] 18 (04/12 2318) BP: (145-167)/(71-83) 145/71 (04/12 2318) SpO2:  [97 %-100 %] 97 % (04/12 2318) well approximated incision Heels are non tender and elevated off the bed using rolled towels Intake/Output from previous day: 04/12 0701 - 04/13 0700 In: 160 [P.O.:160] Out: 370 [Urine:250; Drains:120] Intake/Output this shift: No intake/output data recorded.   Recent Labs  07/07/16 0514 07/08/16 0558  HGB 10.8* 11.7*    Recent Labs  07/07/16 0514 07/08/16 0558  WBC 9.2 10.1  RBC 3.76* 4.01  HCT 33.1* 35.3  PLT 149* 125*    Recent Labs  07/07/16 0514 07/08/16 0558  NA 140 140  K 4.2 3.8  CL 108 107  CO2 26 28  BUN 12 15  CREATININE 0.71 0.61  GLUCOSE 121* 98  CALCIUM 8.4* 8.6*   No results for input(s): LABPT, INR in the last 72 hours.  EXAM General - Patient is Alert, Appropriate and Oriented Extremity - Neurologically intact Neurovascular intact Sensation intact distally Intact pulses distally Dorsiflexion/Plantar flexion intact No cellulitis present Compartment soft Dressing - dressing C/D/I Motor Function - intact, moving foot and toes well on exam.    Past Medical History:  Diagnosis Date  . Breast cancer ([place])    Left Breast  . Collagen vascular disease ([place])   . Hives   . ILD (interstitial lung disease) ([place])   . PVD (peripheral vascular disease) ([place])   . RA (rheumatoid arthritis) ([place])   . Raynaud's phenomenon   . Right thyroid nodule      Assessment/Plan: 2 Days Post-Op Procedure(s) (LRB): COMPUTER ASSISTED TOTAL KNEE ARTHROPLASTY (Left) Active Problems:   S/P total knee arthroplasty  Estimated body mass index is 27.81 kg/m as calculated from the following:   Height as of this encounter: 5\' 3"  (1.6 m).   Weight as of this encounter: 71.2 kg (157 lb). Up with therapy Discharge home with home health  Labs: Were reviewed and acceptable DVT Prophylaxis - Lovenox, Foot Pumps and TED hose Weight-Bearing as tolerated to left leg Hemovac discontinued on today's visit Please wash the operative leg and apply TED stockings. Please change dressing to the operative leg prior to being discharged and give the patient 2 extra honeycomb dressings to take home.  Jillyn Ledger. Orange Cullison 07/08/2016, 8:05 AM

## 2016-07-08 NOTE — Progress Notes (Signed)
Eileen Maldonado to be D/C'd Home per MD order.  Discussed with the patient and all questions fully answered.  VSS, Skin clean, dry and intact without evidence of skin break down, no evidence of skin tears noted. IV catheter discontinued intact. Site without signs and symptoms of complications. Dressing and pressure applied.  An After Visit Summary was printed and given to the patient. Patient received prescription.  D/c education completed with patient/family including follow up instructions, medication list, d/c activities limitations if indicated, with other d/c instructions as indicated by MD - patient able to verbalize understanding, all questions fully answered.   Patient instructed to return to ED, call 911, or call MD for any changes in condition.   Patient escorted via [place], and D/C home via private auto.  [name] 07/08/2016 4:34 PM

## 2016-07-08 NOTE — Care Management Note (Signed)
Case Management Note  Patient Details  Name: Eileen Maldonado MRN: [id number] Date of Birth: [birth year]  Subjective/Objective:   Discharging today                 Action/Plan: [facility] notified of discharge. Cost of Lovenox is $ 28.05. Patient updated. Denies issues paying for medication.   Expected Discharge Date:  07/08/16               Expected Discharge Plan:  [place]  In-House Referral:     Discharge planning Services  CM Consult  Post Acute Care Choice:  Durable Medical Equipment, Home Health Choice offered to:  Patient  DME Arranged:  3-N-1, Walker rolling DME Agency:  [place]:  PT H. [place]:  [facility] (now [facility])  Status of Service:  Completed, signed off  If discussed at [employer] of Stay Meetings, dates discussed:    Additional Comments:  Jolly Mango, RN 07/08/2016, 9:00 AM

## 2016-07-08 NOTE — Progress Notes (Signed)
Physical Therapy Treatment Patient Details Name: Eileen Maldonado MRN: [id number] DOB: 14-Aug-1945 Today's Date: 07/08/2016    History of Present Illness P admitted for L TKR, POD#1 for OT evaluation. PMHx significant for Raynaud's, rheumatoid arthritis, PVD, collagen vascular disease, breast cancer (L), and pt reports during OT evaluation having spinal stenosis.    PT Comments    Pt is making good progress towards goals with ability to ambulate around RN station, only needing 1 seated rest break. +2 required for following with chair secondary to decreased endurance. Pt reports moderate pain this date. Good progress with AAROM and HEP. Will plan to perform stair training prior to dc home this afternoon. Pt remains motivated to participate in therapy.   Follow Up Recommendations  Home health PT     Equipment Recommendations  Rolling walker with 5" wheels    Recommendations for Other Services       Precautions / Restrictions Precautions Precautions: Fall;Knee Precaution Booklet Issued: Yes (comment) Restrictions Weight Bearing Restrictions: Yes LLE Weight Bearing: Weight bearing as tolerated    Mobility  Bed Mobility               General bed mobility comments: not performed as pt received ambulating to bathroom  Transfers Overall transfer level: Needs assistance Equipment used: Rolling walker (2 wheeled) Transfers: Sit to/from Stand Sit to Stand: Supervision         General transfer comment: Cues for pushing from seated surface. once standing, able to stand with upright posture  Ambulation/Gait Ambulation/Gait assistance: Min guard Ambulation Distance (Feet): 200 Feet Assistive device: Rolling walker (2 wheeled) Gait Pattern/deviations: Step-through pattern   Gait velocity interpretation: Below normal speed for age/gender General Gait Details: Cues for upright posture and to keep close to RW. Improved reciprocal gait pattern and slight improvement in gait  speed. 1 seated rest break required secondary to fatigue   Stairs            Wheelchair Mobility    Modified Rankin (Stroke Patients Only)       Balance                                            Cognition Arousal/Alertness: Awake/alert Behavior During Therapy: WFL for tasks assessed/performed Overall Cognitive Status: Within Functional Limits for tasks assessed                                        Exercises Total Joint Exercises Goniometric ROM: L knee AAROM: 0-90 degrees Other Exercises Other Exercises: Seated ther-ex performed with written HEP reviewed. Exercises include ankle pumps, quad sets, glut sets, LAQ, and seated knee flexion stretches. All ther-ex performed x 15 reps with supervision Other Exercises: Pt observed ambulating to bathroom, able to perform hygiene safely and then able to ambulate out of bathroom and take seated rest break on bed    General Comments        Pertinent Vitals/Pain Pain Assessment: 0-10 Pain Score: 5  Pain Location: L knee Pain Descriptors / Indicators: Operative site guarding Pain Intervention(s): Limited activity within patient's tolerance    Home Living                      Prior Function  PT Goals (current goals can now be found in the care plan section) Acute Rehab PT Goals Patient Stated Goal: to go home PT Goal Formulation: With patient Time For Goal Achievement: 07/20/16 Potential to Achieve Goals: Good Progress towards PT goals: Progressing toward goals    Frequency    BID      PT Plan Current plan remains appropriate    Co-evaluation             End of Session Equipment Utilized During Treatment: Gait belt Activity Tolerance: Patient tolerated treatment well Patient left: in chair;with chair alarm set Nurse Communication: Mobility status PT Visit Diagnosis: Muscle weakness (generalized) (M62.81);Pain Pain - Right/Left: Left Pain -  part of body: Knee     Time: 5521-7471 PT Time Calculation (min) (ACUTE ONLY): 42 min  Charges:  $Gait Training: 23-37 mins $Therapeutic Exercise: 8-22 mins                    G Codes:       Eileen Maldonado, PT, DPT [phone]    Eileen Maldonado 07/08/2016, 11:19 AM

## 2016-09-06 ENCOUNTER — Ambulatory Visit: Payer: Medicare Other | Admitting: Occupational Therapy

## 2016-09-09 ENCOUNTER — Ambulatory Visit: Payer: Medicare Other | Admitting: Occupational Therapy

## 2016-09-14 ENCOUNTER — Ambulatory Visit: Payer: Medicare Other | Attending: Rheumatology | Admitting: Occupational Therapy

## 2016-09-14 DIAGNOSIS — M05742 Rheumatoid arthritis with rheumatoid factor of left hand without organ or systems involvement: Secondary | ICD-10-CM | POA: Insufficient documentation

## 2016-09-14 DIAGNOSIS — M25642 Stiffness of left hand, not elsewhere classified: Secondary | ICD-10-CM

## 2016-09-14 DIAGNOSIS — M25641 Stiffness of right hand, not elsewhere classified: Secondary | ICD-10-CM | POA: Insufficient documentation

## 2016-09-14 DIAGNOSIS — M05741 Rheumatoid arthritis with rheumatoid factor of right hand without organ or systems involvement: Secondary | ICD-10-CM | POA: Diagnosis present

## 2016-09-14 NOTE — Therapy (Signed)
[place] [facility] [address] [place], [address] Phone: [phone]   Fax:  [phone]  Occupational Therapy Evaluation  Patient Details  Name: Eileen Maldonado MRN: [id number] Date of Birth: 06/03/1945 Referring Provider: [name]  Encounter Date: 09/14/2016      OT End of Session - 09/14/16 1812    Visit Number 1   Number of Visits 1   Date for OT Re-Evaluation 09/14/16   OT Start Time 6967   OT Stop Time 1422   OT Time Calculation (min) 67 min   Activity Tolerance Patient tolerated treatment well   Behavior During Therapy [facility] for tasks assessed/performed      Past Medical History:  Diagnosis Date  . Breast cancer ([place])    Left Breast  . Collagen vascular disease ([place])   . Hives   . ILD (interstitial lung disease) ([place])   . PVD (peripheral vascular disease) ([place])   . RA (rheumatoid arthritis) ([place])   . Raynaud's phenomenon   . Right thyroid nodule     Past Surgical History:  Procedure Laterality Date  . appy    . breast biopsy    . BREAST SURGERY    . hernia repair    . HERNIA REPAIR     Umbilical Hernia  . KNEE ARTHROPLASTY Left 07/06/2016   Procedure: COMPUTER ASSISTED TOTAL KNEE ARTHROPLASTY;  Surgeon: Dereck Leep, MD;  Location: [facility] ORS;  Service: Orthopedics;  Laterality: Left;    There were no vitals filed for this visit.      Subjective Assessment - 09/14/16 1807    Subjective  I wanted to come and see you again - I need new splints - these do not work anymore - woreout - and check how my hands are doing    Patient Stated Goals Want you to compare to last year my strength and ROM    Currently in Pain? No/denies           [facility] OT Assessment - 09/14/16 0001      Assessment   Diagnosis Bilateral hand pain    Referring Provider [facility]   Onset Date 08/25/16   Assessment pt was seen once a year the last 2 yrs      Home  Environment   Lives With Spouse     Prior  Function   Leisure R hand dominant, house work , Loss adjuster, chartered , play on table ,      Strength   Right Hand Grip (lbs) 48   Right Hand Lateral Pinch 16 lbs   Right Hand 3 Point Pinch 10 lbs   Left Hand Grip (lbs) 31   Left Hand Lateral Pinch 13 lbs   Left Hand 3 Point Pinch 10 lbs     Right Hand AROM   R Index  MCP 0-90 90 Degrees   R Index PIP 0-100 100 Degrees   R Long  MCP 0-90 90 Degrees   R Long PIP 0-100 100 Degrees   R Ring  MCP 0-90 95 Degrees   R Ring PIP 0-100 100 Degrees   R Little  MCP 0-90 100 Degrees   R Little PIP 0-100 85 Degrees     Left Hand AROM   L Index  MCP 0-90 90 Degrees   L Index PIP 0-100 100 Degrees   L Long  MCP 0-90 90 Degrees   L Long PIP 0-100 100 Degrees   L Ring  MCP 0-90  90 Degrees   L Ring PIP 0-100 100 Degrees   L Little  MCP 0-90 100 Degrees   L Little PIP 0-100 90 Degrees        Review HEP and fitted with splints and isotoner gloves for winter   Pt to do joint protection - use large joint , hold object 90 degrees with knuckles, turn doorknob or faucets to thumb side   Wearing of anti ulnar deviation splints on bilateral hands - pt for the last 2 yrs slept with them  But recommend pt to wear some during day with act - it aligned her digits with use   RD of digits to be done 2 x day 5-8 reps                OT Education - 09/14/16 1812    Education provided Yes   Education Details HEP and joint protection    Person(s) Educated Patient   Methods Explanation;Demonstration;Tactile cues;Verbal cues;Handout   Comprehension Verbal cues required;Returned demonstration;Verbalized understanding          OT Short Term Goals - 09/10/14 1720      OT SHORT TERM GOAL #1   Title Pt. will be indep. with HEP to increase/maintain bilateral thumb AROM , digits extention and flexion    Status Achieved     OT SHORT TERM GOAL #2   Title Pt. will have improvement with pain by 10-15 points and function by 10 points on PRWHE    Status  Not Met     OT SHORT TERM GOAL #3   Title Pt. will be indep. with wearing appropriate splints  to decrease  pain and increase use of thumb's/ decrease ulnar deviation    Status Achieved           OT Long Term Goals - 09/14/16 1817      OT LONG TERM GOAL #1   Title Pt verbalize understanding for homeprogram to cont with and donning and doffing of anti ulnar devation splints fitted this date    Status Achieved               Plan - 09/14/16 1812    Clinical Impression Statement Pt was refer by Dr [name] - pt was seen about year ago  - and 2 yrs ago for bilateral hands - subluxation of extensor tendons - 5th digits nad 4th the worse- pt wanted to be reasses and needed new anti ulnar deviation splints - hers were wore out- and did not work anymore - pt cont to have ulnar deviation of digits at rest - and some subluxation of 4th and 5th - but able to actively extend R more than L - with splints L 5th can extend 3/4 range - R grip increase again compare to year ago and ROM /L grip and bilateral prehension the same - pt to cont with  anti ulnar devations splints and review with pt agaiin jont protection and  radial deviation  AROM HEP - pt to cont with homeprogram at home    Occupational performance deficits (Please refer to evaluation for details): ADL's;IADL's;Leisure   Rehab Potential Good   OT Frequency One time visit   OT Treatment/Interventions Splinting;Patient/family education;Self-care/ADL training   Plan pt to cont  with homeprogram    OT Home Exercise Plan splints wearing    Consulted and Agree with Plan of Care Patient      Patient will benefit from skilled therapeutic intervention in order to improve the  following deficits and impairments:  Impaired flexibility, Impaired UE functional use, Decreased strength  Visit Diagnosis: Stiffness of right hand, not elsewhere classified - Plan: Ot plan of care cert/re-cert  Stiffness of left hand, not elsewhere classified -  Plan: Ot plan of care cert/re-cert  Rheumatoid arthritis involving both hands with positive rheumatoid factor ([place]) - Plan: Ot plan of care cert/re-cert      G-Codes - 83/07/46 1818    Functional Assessment Tool Used (Outpatient only) ROM grip and prehension  , functional use - clinical judgement    Functional Limitation Self care   Self Care Current Status [phone]) At least 20 percent but less than 40 percent impaired, limited or restricted   Self Care Goal Status (O7308) At least 1 percent but less than 20 percent impaired, limited or restricted   Self Care Discharge Status [phone]) At least 1 percent but less than 20 percent impaired, limited or restricted      Problem List Patient Active Problem List   Diagnosis Date Noted  . S/P total knee arthroplasty 07/06/2016  . Ileitis 01/31/2016  . Rheumatoid arthritis ([address]) 07/28/2014    Anaih Brander OTR/L, CLT 09/14/2016, 6:21 PM  [place] [facility] [address] [place], [address] Phone: [phone]   Fax:  [phone]  Name: ALYVIAH CRANDLE MRN: [id number] Date of Birth: 30-Sep-1945

## 2016-09-14 NOTE — Patient Instructions (Signed)
Pt to do joint protection - use large joint , hold object 90 degrees with knuckles, turn doorknob or faucets to thumb side   Wearing of anti ulnar deviation splints on bilateral hands - pt for the last 2 yrs slept with them  But recommend pt to wear some during day with act - it aligned her digits with use   RD of digits to be done 2 x day 5-8 reps

## 2017-01-10 ENCOUNTER — Other Ambulatory Visit: Payer: Self-pay | Admitting: Specialist

## 2017-01-10 DIAGNOSIS — R05 Cough: Secondary | ICD-10-CM

## 2017-01-10 DIAGNOSIS — R059 Cough, unspecified: Secondary | ICD-10-CM

## 2017-01-10 DIAGNOSIS — R0609 Other forms of dyspnea: Principal | ICD-10-CM

## 2017-01-10 DIAGNOSIS — J84112 Idiopathic pulmonary fibrosis: Secondary | ICD-10-CM

## 2017-01-18 ENCOUNTER — Ambulatory Visit
Admission: RE | Admit: 2017-01-18 | Discharge: 2017-01-18 | Disposition: A | Discharge status: Home / Self Care | Payer: Medicare Other | Source: Ambulatory Visit | Attending: Specialist | Admitting: Specialist

## 2017-01-18 DIAGNOSIS — J8489 Other specified interstitial pulmonary diseases: Secondary | ICD-10-CM | POA: Diagnosis not present

## 2017-01-18 DIAGNOSIS — R0609 Other forms of dyspnea: Secondary | ICD-10-CM | POA: Diagnosis not present

## 2017-01-18 DIAGNOSIS — R05 Cough: Secondary | ICD-10-CM | POA: Insufficient documentation

## 2017-01-18 DIAGNOSIS — J84112 Idiopathic pulmonary fibrosis: Secondary | ICD-10-CM

## 2017-01-18 DIAGNOSIS — R059 Cough, unspecified: Secondary | ICD-10-CM

## 2017-11-10 DIAGNOSIS — D259 Leiomyoma of uterus, unspecified: Secondary | ICD-10-CM | POA: Insufficient documentation

## 2017-12-18 ENCOUNTER — Other Ambulatory Visit: Payer: Self-pay

## 2017-12-18 ENCOUNTER — Encounter
Admission: RE | Admit: 2017-12-18 | Discharge: 2017-12-18 | Disposition: A | Discharge status: Home / Self Care | Payer: Medicare Other | Source: Ambulatory Visit | Attending: Obstetrics and Gynecology | Admitting: Obstetrics and Gynecology

## 2017-12-18 DIAGNOSIS — Z01818 Encounter for other preprocedural examination: Secondary | ICD-10-CM | POA: Insufficient documentation

## 2017-12-18 HISTORY — DX: Pneumonia, unspecified organism: J18.9

## 2017-12-18 LAB — CBC
HEMATOCRIT: 43.6 % (ref 35.0–47.0)
Hemoglobin: 14.5 g/dL (ref 12.0–16.0)
MCH: 29.3 pg (ref 26.0–34.0)
MCHC: 33.2 g/dL (ref 32.0–36.0)
MCV: 88.3 fL (ref 80.0–100.0)
PLATELETS: 258 10*3/uL (ref 150–440)
RBC: 4.94 MIL/uL (ref 3.80–5.20)
RDW: 15 % — AB (ref 11.5–14.5)
WBC: 7.3 10*3/uL (ref 3.6–11.0)

## 2017-12-18 LAB — TYPE AND SCREEN
ABO/RH(D): A POS
ANTIBODY SCREEN: NEGATIVE

## 2017-12-18 NOTE — Patient Instructions (Signed)
Your procedure is scheduled on: Friday, December 22, 2017 Report to Day Surgery on the 2nd floor of the [facility]. To find out your arrival time, please call [phone] between 1PM - 3PM on: Thursday, December 21, 2017  REMEMBER: Instructions that are not followed completely may result in serious medical risk, up to and including death; or upon the discretion of your surgeon and anesthesiologist your surgery may need to be rescheduled.  Do not eat food after midnight the night before surgery.  No gum chewing, lozengers or hard candies.  You may however, drink CLEAR liquids up to 2 hours before you are scheduled to arrive for your surgery. Do not drink anything within 2 hours of the start of your surgery.  Clear liquids include: - water  - apple juice without pulp - gatorade - black coffee or tea (Do NOT add milk or creamers to the coffee or tea) Do NOT drink anything that is not on this list.  No Alcohol for 24 hours before or after surgery.  No Smoking including e-cigarettes for 24 hours prior to surgery.  No chewable tobacco products for at least 6 hours prior to surgery.  No nicotine patches on the day of surgery.  On the morning of surgery brush your teeth with toothpaste and water, you may rinse your mouth with mouthwash if you wish. Do not swallow any toothpaste or mouthwash.  Notify your doctor if there is any change in your medical condition (cold, fever, infection).  Do not wear jewelry, make-up, hairpins, clips or nail polish.  Do not wear lotions, powders, or perfumes. You may wear deodorant.  Do not shave 48 hours prior to surgery.   Contacts and dentures may not be worn into surgery.  Do not bring valuables to the hospital, including drivers license, insurance or credit cards.  [facility] is not responsible for any belongings or valuables.   TAKE THESE MEDICATIONS THE MORNING OF SURGERY:  1.  advair inhaler 2.  Gabapentin 3.  Albuterol  inhaler  Use inhalers on the day of surgery and bring to the hospital.  NOW!  Stop DICLOFENAC, Anti-inflammatories (NSAIDS) such as Advil, Aleve, Ibuprofen, Motrin, Naproxen, Naprosyn and Aspirin based products such as Excedrin, Goodys Powder, BC Powder. (May take Tylenol or Acetaminophen if needed.)  NOW!  Stop ANY OVER THE COUNTER supplements until after surgery. (May continue Vitamin D.)  Wear comfortable clothing (specific to your surgery type) to the hospital.  If you are being discharged the day of surgery, you will not be allowed to drive home. You will need a responsible adult to drive you home and stay with you that night.   If you are taking public transportation, you will need to have a responsible adult with you. Please confirm with your physician that it is acceptable to use public transportation.   Please call [phone] if you have any questions about these instructions.

## 2017-12-18 NOTE — H&P (Signed)
Chief Complaint:     Eileen Maldonado is a 72 y.o. female here for Pre Op Consulting .  HPI:  Pt presents for a preoperative visit to schedule a fractional D&C, hysteroscopy.  Known fibroids. Bleeding started after last pelvic exam, x1week, and returned after EMBx attempt.  EMBx unsuccessful.  Prior workup?:  [place]: Uterus: Anteverted, Fibroids: 1. Post:5.3cm 2. Lt lat: 2.2 cm 3. Lt post:2.5cm 4. Fundal: 4.4 cm 5. Fundal: 2.9 cm  Endometrium=6.58mm No free fluid seen B/L ovaries appear wnl  Hx of LCIS, s/p chemoppx x5 yrs, now vaginal estrogen that she stopped until we get the bleeding dx   Past Medical History:  has a past medical history of Allergic state, Breast neoplasm, Tis (LCIS), Hives, Interstitial lung disease (CMS-HCC), Lobular carcinoma (CMS-HCC), Pes planus, PVD (peripheral vascular disease) (CMS-HCC), RA (rheumatoid arthritis) (CMS-HCC), Raynaud phenomenon, and Thyroid nodule.  Past Surgical History:  has a past surgical history that includes Breast excisional biopsy; Appendectomy; Hernia repair; Colonoscopy (01/2013); and Left total knee arthroplasty using computer-assisted navigation (07/06/2016). Family History: family history includes Breast cancer in her mother; Colon cancer in her maternal aunt; Polycystic ovary syndrome in her daughter; Stroke in her father. Social History:  reports that she has never smoked. She has never used smokeless tobacco. She reports that she does not drink alcohol or use drugs. OB/GYN History:  OB History    Gravida  3   Para  2   Term  2   Preterm      AB  1   Living  2     SAB  1   TAB      Ectopic      Molar      Multiple      Live Births             Allergies: is allergic to celecoxib and methotrexate. Medications:  Current Outpatient Medications:  .  abatacept (ORENCIA) 125 mg/mL injection syringe, Inject 1 mL (125 mg total) subcutaneously every 7 (seven) days, Disp: 12 mL, Rfl: 1 .  albuterol 90  mcg/actuation inhaler, Inhale 2 inhalations into the lungs every 6 (six) hours as needed for Wheezing, Disp: 3 Inhaler, Rfl: 3 .  cholecalciferol (VITAMIN D3) 1,000 unit capsule, Take 1,000 Units by mouth once daily Dose unknown  , Disp: , Rfl:  .  conjugated estrogens (PREMARIN) 0.625 mg/gram vaginal cream, Place 0.5 g vaginally twice a week 1 applicator twice weekly, Disp: 12 g, Rfl: 3 .  dextran 70-hypromellose (TEARS NATURALE) ophthalmic solution, 1 drop every 4 (four) hours as needed for Dry Eyes., Disp: , Rfl:  .  diclofenac (VOLTAREN) 50 MG EC tablet, Take 1 tablet (50 mg total) by mouth 2 (two) times daily with meals., Disp: 180 tablet, Rfl: 3 .  doxycycline (MONODOX) 100 MG capsule, Take 1 capsule (100 mg total) by mouth 2 (two) times daily, Disp: 14 capsule, Rfl: 0 .  fluticasone propion-salmeterol (ADVAIR DISKUS) 250-50 mcg/dose diskus inhaler, Inhale 1 inhalation into the lungs every 12 (twelve) hours, Disp: 3 Inhaler, Rfl: 3 .  fluticasone propionate (FLONASE) 50 mcg/actuation nasal spray, Place 2 sprays into both nostrils once daily, Disp: 48 g, Rfl: 3 .  gabapentin (NEURONTIN) 300 MG capsule, Take 1 capsule (300 mg total) by mouth 3 (three) times daily, Disp: 270 capsule, Rfl: 2 .  [START ON 12/20/2017] hydroCHLOROthiazide (HYDRODIURIL) 25 MG tablet, Take 1 tablet (25 mg total) by mouth once daily, Disp: 90 tablet, Rfl: 3 .  LACTOBACILLUS ACIDOPHILUS (  PROBIOTIC ORAL), Take 1 capsule by mouth once daily., Disp: , Rfl:  .  leflunomide (ARAVA) 10 MG tablet, Take 1 tablet (10 mg total) by mouth once daily, Disp: 90 tablet, Rfl: 3 .  montelukast (SINGULAIR) 10 mg tablet, Take 1 tablet (10 mg total) by mouth nightly, Disp: 90 tablet, Rfl: 3 .  predniSONE (DELTASONE) 5 MG tablet, 6 pills x1day, 5x1,4x1,3x1,2x1,1x1, Disp: 21 tablet, Rfl: 0  Review of Systems: No SOB, no palpitations or chest pain, no new lower extremity edema, no nausea or vomiting or bowel or bladder complaints. See HPI for  gyn specific ROS.   Exam:   Vitals:   12/18/17 1057  BP: (!) 174/93  Pulse:     WDWN   female in NAD Body mass index is 29.23 kg/m.  General: Patient is well-groomed, well-nourished, appears stated age in no acute distress  HEENT: head is atraumatic and normocephalic, trachea is midline, neck is supple with no palpable nodules  CV: Regular rhythm and normal heart rate, no murmur  Pulm: Clear to auscultation throughout lung fields with no wheezing, crackles, or rhonchi. No increased work of breathing  Abdomen: soft , no mass, non-tender, no rebound tenderness, no hepatomegaly  Pelvic: Deferred  Impression:   The encounter diagnosis was Post-menopausal bleeding.    Plan:   -  Preoperative visit: D&C hysteroscopy for postmenopausal bleeding with cervical stenosis. Consents signed today. Risks of surgery were discussed with the patient including but not limited to: bleeding which may require transfusion; infection which may require antibiotics; injury to uterus or surrounding organs; intrauterine scarring which may impair future fertility; need for additional procedures including laparotomy or laparoscopy; and other postoperative/anesthesia complications. Written informed consent was obtained.  This is a scheduled same-day surgery. She will have a postop visit in 2 weeks to review operative findings and pathology. Return for Postop check.  Eileen George, MD

## 2017-12-19 ENCOUNTER — Encounter
Admission: RE | Admit: 2017-12-19 | Discharge: 2017-12-19 | Disposition: A | Discharge status: Home / Self Care | Payer: Medicare Other | Source: Ambulatory Visit | Attending: Obstetrics and Gynecology | Admitting: Obstetrics and Gynecology

## 2017-12-19 DIAGNOSIS — Z01818 Encounter for other preprocedural examination: Secondary | ICD-10-CM | POA: Diagnosis not present

## 2017-12-19 LAB — BASIC METABOLIC PANEL
Anion gap: 9 (ref 5–15)
BUN: 19 mg/dL (ref 8–23)
CHLORIDE: 104 mmol/L (ref 98–111)
CO2: 29 mmol/L (ref 22–32)
CREATININE: 1.06 mg/dL — AB (ref 0.44–1.00)
Calcium: 9.1 mg/dL (ref 8.9–10.3)
GFR calc Af Amer: 59 mL/min — ABNORMAL LOW (ref >60)
GFR calc non Af Amer: 51 mL/min — ABNORMAL LOW (ref >60)
Glucose, Bld: 113 mg/dL — ABNORMAL HIGH (ref 70–99)
POTASSIUM: 3.3 mmol/L — AB (ref 3.5–5.1)
SODIUM: 142 mmol/L (ref 135–145)

## 2017-12-20 NOTE — Pre-Procedure Instructions (Signed)
Met B sent to Dr. Leafy Ro and Anesthesia for review.

## 2017-12-22 ENCOUNTER — Ambulatory Visit: Payer: Medicare Other | Admitting: Anesthesiology

## 2017-12-22 ENCOUNTER — Ambulatory Visit
Admission: RE | Admit: 2017-12-22 | Discharge: 2017-12-22 | Disposition: A | Discharge status: Home / Self Care | Payer: Medicare Other | Source: Ambulatory Visit | Attending: Obstetrics and Gynecology | Admitting: Obstetrics and Gynecology

## 2017-12-22 ENCOUNTER — Encounter
Admission: RE | Disposition: A | Discharge status: Home / Self Care | Payer: Self-pay | Source: Ambulatory Visit | Attending: Obstetrics and Gynecology

## 2017-12-22 ENCOUNTER — Encounter: Payer: Self-pay | Admitting: Certified Registered Nurse Anesthetist

## 2017-12-22 DIAGNOSIS — M069 Rheumatoid arthritis, unspecified: Secondary | ICD-10-CM | POA: Diagnosis not present

## 2017-12-22 DIAGNOSIS — N72 Inflammatory disease of cervix uteri: Secondary | ICD-10-CM | POA: Insufficient documentation

## 2017-12-22 DIAGNOSIS — Z79899 Other long term (current) drug therapy: Secondary | ICD-10-CM | POA: Insufficient documentation

## 2017-12-22 DIAGNOSIS — N840 Polyp of corpus uteri: Secondary | ICD-10-CM | POA: Diagnosis not present

## 2017-12-22 DIAGNOSIS — N841 Polyp of cervix uteri: Secondary | ICD-10-CM | POA: Diagnosis not present

## 2017-12-22 DIAGNOSIS — N950 Postmenopausal bleeding: Secondary | ICD-10-CM | POA: Diagnosis present

## 2017-12-22 DIAGNOSIS — D250 Submucous leiomyoma of uterus: Secondary | ICD-10-CM | POA: Diagnosis not present

## 2017-12-22 DIAGNOSIS — I7300 Raynaud's syndrome without gangrene: Secondary | ICD-10-CM | POA: Insufficient documentation

## 2017-12-22 DIAGNOSIS — I739 Peripheral vascular disease, unspecified: Secondary | ICD-10-CM | POA: Insufficient documentation

## 2017-12-22 HISTORY — PX: HYSTEROSCOPY W/D&C: SHX1775

## 2017-12-22 LAB — POCT I-STAT 4, (NA,K, GLUC, HGB,HCT)
Glucose, Bld: 100 mg/dL — ABNORMAL HIGH (ref 70–99)
HCT: 43 % (ref 36.0–46.0)
Hemoglobin: 14.6 g/dL (ref 12.0–15.0)
Potassium: 3.3 mmol/L — ABNORMAL LOW (ref 3.5–5.1)
SODIUM: 138 mmol/L (ref 135–145)

## 2017-12-22 SURGERY — DILATATION AND CURETTAGE /HYSTEROSCOPY
Anesthesia: General

## 2017-12-22 MED ORDER — PHENYLEPHRINE HCL 10 MG/ML IJ SOLN
INTRAMUSCULAR | Status: DC | PRN
  Administered 2017-12-22: 100 ug via INTRAVENOUS

## 2017-12-22 MED ORDER — MIDAZOLAM HCL 2 MG/2ML IJ SOLN
INTRAMUSCULAR | Status: DC | PRN
  Administered 2017-12-22: 1 mg via INTRAVENOUS

## 2017-12-22 MED ORDER — FENTANYL CITRATE (PF) 100 MCG/2ML IJ SOLN: 25.0000 ug | INTRAMUSCULAR | Status: DC | PRN

## 2017-12-22 MED ORDER — LIDOCAINE HCL (PF) 2 % IJ SOLN
INTRAMUSCULAR | Status: AC
  Filled 2017-12-22: qty 10

## 2017-12-22 MED ORDER — ONDANSETRON HCL 4 MG/2ML IJ SOLN
INTRAMUSCULAR | Status: AC
  Filled 2017-12-22: qty 2

## 2017-12-22 MED ORDER — KETOROLAC TROMETHAMINE 15 MG/ML IJ SOLN
INTRAMUSCULAR | Status: DC | PRN
  Administered 2017-12-22: 15 mg via INTRAVENOUS

## 2017-12-22 MED ORDER — LIDOCAINE HCL (CARDIAC) PF 100 MG/5ML IV SOSY
PREFILLED_SYRINGE | INTRAVENOUS | Status: DC | PRN
  Administered 2017-12-22: 60 mg via INTRAVENOUS

## 2017-12-22 MED ORDER — LABETALOL HCL 5 MG/ML IV SOLN
5.0000 mg | INTRAVENOUS | Status: DC | PRN
  Administered 2017-12-22: 5 mg via INTRAVENOUS

## 2017-12-22 MED ORDER — LACTATED RINGERS IV SOLN
INTRAVENOUS | Status: DC
  Administered 2017-12-22: 11:00:00 via INTRAVENOUS

## 2017-12-22 MED ORDER — PROPOFOL 10 MG/ML IV BOLUS
INTRAVENOUS | Status: AC
  Filled 2017-12-22: qty 20

## 2017-12-22 MED ORDER — ACETAMINOPHEN 500 MG PO TABS: 1000.0000 mg | ORAL_TABLET | Freq: Four times a day (QID) | ORAL | 0 refills | Status: AC

## 2017-12-22 MED ORDER — ONDANSETRON HCL 4 MG/2ML IJ SOLN
INTRAMUSCULAR | Status: DC | PRN
  Administered 2017-12-22: 4 mg via INTRAVENOUS

## 2017-12-22 MED ORDER — DEXAMETHASONE SODIUM PHOSPHATE 10 MG/ML IJ SOLN
INTRAMUSCULAR | Status: AC
  Filled 2017-12-22: qty 1

## 2017-12-22 MED ORDER — OXYCODONE HCL 5 MG/5ML PO SOLN: 5.0000 mg | Freq: Once | ORAL | Status: DC | PRN

## 2017-12-22 MED ORDER — MIDAZOLAM HCL 2 MG/2ML IJ SOLN
INTRAMUSCULAR | Status: AC
  Filled 2017-12-22: qty 2

## 2017-12-22 MED ORDER — FAMOTIDINE 20 MG PO TABS
20.0000 mg | ORAL_TABLET | Freq: Once | ORAL | Status: AC
  Administered 2017-12-22: 20 mg via ORAL

## 2017-12-22 MED ORDER — DOCUSATE SODIUM 100 MG PO CAPS: 100.0000 mg | ORAL_CAPSULE | Freq: Two times a day (BID) | ORAL | 0 refills | Status: DC

## 2017-12-22 MED ORDER — FENTANYL CITRATE (PF) 100 MCG/2ML IJ SOLN
INTRAMUSCULAR | Status: DC | PRN
  Administered 2017-12-22 (×2): 25 ug via INTRAVENOUS

## 2017-12-22 MED ORDER — PROPOFOL 10 MG/ML IV BOLUS
INTRAVENOUS | Status: DC | PRN
  Administered 2017-12-22: 120 mg via INTRAVENOUS

## 2017-12-22 MED ORDER — OXYCODONE HCL 5 MG PO TABS: 5.0000 mg | ORAL_TABLET | Freq: Once | ORAL | Status: DC | PRN

## 2017-12-22 MED ORDER — FENTANYL CITRATE (PF) 100 MCG/2ML IJ SOLN
INTRAMUSCULAR | Status: AC
  Filled 2017-12-22: qty 2

## 2017-12-22 MED ORDER — PROMETHAZINE HCL 25 MG/ML IJ SOLN: 6.2500 mg | INTRAMUSCULAR | Status: DC | PRN

## 2017-12-22 MED ORDER — DEXAMETHASONE SODIUM PHOSPHATE 10 MG/ML IJ SOLN
INTRAMUSCULAR | Status: DC | PRN
  Administered 2017-12-22: 10 mg via INTRAVENOUS

## 2017-12-22 SURGICAL SUPPLY — 19 items
BAG INFUSER PRESSURE 100CC (MISCELLANEOUS) ×2 IMPLANT
CANISTER SUCT 3000ML PPV (MISCELLANEOUS) ×2 IMPLANT
CATH ROBINSON RED A/P 16FR (CATHETERS) ×2 IMPLANT
DEVICE MYOSURE LITE (MISCELLANEOUS) ×2 IMPLANT
ELECT REM PT RETURN 9FT ADLT (ELECTROSURGICAL) ×2
ELECTRODE REM PT RTRN 9FT ADLT (ELECTROSURGICAL) ×1 IMPLANT
GLOVE BIO SURGEON STRL SZ7 (GLOVE) ×2 IMPLANT
GLOVE INDICATOR 7.5 STRL GRN (GLOVE) ×2 IMPLANT
GOWN STRL REUS W/ TWL LRG LVL3 (GOWN DISPOSABLE) ×2 IMPLANT
GOWN STRL REUS W/TWL LRG LVL3 (GOWN DISPOSABLE) ×2
IV LACTATED RINGER IRRG 3000ML (IV SOLUTION) ×1
IV LR IRRIG 3000ML ARTHROMATIC (IV SOLUTION) ×1 IMPLANT
KIT PROCEDURE FLUENT (KITS) ×2 IMPLANT
KIT TURNOVER CYSTO (KITS) ×2 IMPLANT
PACK DNC HYST (MISCELLANEOUS) ×2 IMPLANT
PAD OB MATERNITY 4.3X12.25 (PERSONAL CARE ITEMS) ×2 IMPLANT
PAD PREP 24X41 OB/GYN DISP (PERSONAL CARE ITEMS) ×2 IMPLANT
SEAL ROD LENS SCOPE MYOSURE (ABLATOR) ×2 IMPLANT
TUBING CONNECTING 10 (TUBING) ×2 IMPLANT

## 2017-12-22 NOTE — Anesthesia Procedure Notes (Signed)
Procedure Name: LMA Insertion Date/Time: 12/22/2017 12:09 PM Performed by: Eben Burow, CRNA Pre-anesthesia Checklist: Patient identified, Emergency Drugs available, Suction available, Patient being monitored and Timeout performed Patient Re-evaluated:Patient Re-evaluated prior to induction Oxygen Delivery Method: Circle system utilized Preoxygenation: Pre-oxygenation with 100% oxygen Induction Type: IV induction Ventilation: Mask ventilation without difficulty LMA: LMA inserted LMA Size: 4.0 Number of attempts: 2 Placement Confirmation: positive ETCO2 and breath sounds checked- equal and bilateral Tube secured with: Tape Dental Injury: Teeth and Oropharynx as per pre-operative assessment

## 2017-12-22 NOTE — Anesthesia Preprocedure Evaluation (Addendum)
Anesthesia Evaluation  Patient identified by MRN, date of birth, ID band Patient awake    Reviewed: Allergy & Precautions, H&P , NPO status , Patient's Chart, lab work & pertinent test results  Airway Mallampati: III  TM Distance: >3 FB Neck ROM: limited    Dental  (+) Partial Upper   Pulmonary  Interstitial lung disease.  Not O2 dependent.  Ambulates unassisted.  SOB is at baseline.   breath sounds clear to auscultation       Cardiovascular + Peripheral Vascular Disease  negative cardio ROS   Rhythm:regular Rate:Normal     Neuro/Psych negative neurological ROS  negative psych ROS   GI/Hepatic negative GI ROS, Neg liver ROS,   Endo/Other  negative endocrine ROS  Renal/GU      Musculoskeletal  (+) Arthritis , Rheumatoid disorders,  Raynaud's disease   Abdominal   Peds  Hematology negative hematology ROS (+)   Anesthesia Other Findings Past Medical History: No date: Breast cancer ([place])     Comment:  Left Breast No date: Collagen vascular disease (HCC) No date: Hives No date: ILD (interstitial lung disease) (HCC) No date: Pneumonia No date: PVD (peripheral vascular disease) (HCC) No date: RA (rheumatoid arthritis) (HCC) No date: Raynaud's phenomenon No date: Right thyroid nodule  Past Surgical History: No date: APPENDECTOMY No date: appy No date: breast biopsy No date: BREAST SURGERY; Left     Comment:  biopsy of milk duct No date: hernia repair No date: HERNIA REPAIR     Comment:  Umbilical Hernia 0/62/6948: KNEE ARTHROPLASTY; Left     Comment:  Procedure: COMPUTER ASSISTED TOTAL KNEE ARTHROPLASTY;                Surgeon: Dereck Leep, MD;  Location: [facility] ORS;                Service: Orthopedics;  Laterality: Left;     Reproductive/Obstetrics negative OB ROS                           Anesthesia Physical Anesthesia Plan  ASA: III  Anesthesia Plan: General LMA    Post-op Pain Management:    Induction:   PONV Risk Score and Plan: Ondansetron and Dexamethasone  Airway Management Planned:   Additional Equipment:   Intra-op Plan:   Post-operative Plan:   Informed Consent: I have reviewed the patients History and Physical, chart, labs and discussed the procedure including the risks, benefits and alternatives for the proposed anesthesia with the patient or authorized representative who has indicated his/her understanding and acceptance.   Dental Advisory Given  Plan Discussed with: Anesthesiologist, CRNA and Surgeon  Anesthesia Plan Comments:         Anesthesia Quick Evaluation

## 2017-12-22 NOTE — Transfer of Care (Signed)
Immediate Anesthesia Transfer of Care Note  Patient: Eileen Maldonado  Procedure(s) Performed: DILATATION AND CURETTAGE /HYSTEROSCOPY (N/A )  Patient Location: PACU  Anesthesia Type:General  Level of Consciousness: drowsy  Airway & Oxygen Therapy: Patient Spontanous Breathing and Patient connected to face mask oxygen  Post-op Assessment: Report given to RN and Post -op Vital signs reviewed and stable  Post vital signs: Reviewed and stable  Last Vitals:  Vitals Value Taken Time  BP 157/95 12/22/2017  1:11 PM  Temp    Pulse 78 12/22/2017  1:13 PM  Resp 10 12/22/2017  1:13 PM  SpO2 100 % 12/22/2017  1:13 PM  Vitals shown include unvalidated device data.  Last Pain:  Vitals:   12/22/17 1027  TempSrc: Tympanic  PainSc: 0-No pain         Complications: No apparent anesthesia complications

## 2017-12-22 NOTE — Anesthesia Post-op Follow-up Note (Signed)
Anesthesia QCDR form completed.        

## 2017-12-22 NOTE — Discharge Instructions (Signed)
Discharge instructions after a hysteroscopy with dilation and curettage  Signs and Symptoms to Report  Call our office at [phone] if you have any of the following:   . Fever over 100.4 degrees or higher . Severe stomach pain not relieved with pain medications . Bright red bleeding that's heavier than a period that does not slow with rest after the first 24 hours . To go the bathroom a lot (frequency), you can't hold your urine (urgency), or it hurts when you empty your bladder (urinate) . Chest pain . Shortness of breath . Pain in the calves of your legs . Severe nausea and vomiting not relieved with anti-nausea medications . Any concerns  What You Can Expect after Surgery . You may see some pink tinged, bloody fluid. This is normal. You may also have cramping for several days.   Activities after Your Discharge Follow these guidelines to help speed your recovery at home: . Don't drive if you are in pain or taking narcotic pain medicine. You may drive when you can safely slam on the brakes, turn the wheel forcefully, and rotate your torso comfortably. This is typically 4-7 days. Practice in a parking lot or side street prior to attempting to drive regularly.  . Ask others to help with household chores for 4 weeks. . Don't do strenuous activities, exercises, or sports like vacuuming, tennis, squash, etc. until your doctor says it is safe to do so. . Walk as you feel able. Rest often since it may take a week or two for your energy level to return to normal.  . You may climb stairs . Avoid constipation:   -Eat fruits, vegetables, and whole grains. Eat small meals as your appetite will take time to return to normal.   -Drink 6 to 8 glasses of water each day unless your doctor has told you to limit your fluids.   -Use a laxative or stool softener as needed if constipation becomes a problem. You may take Miralax, metamucil, Citrucil, Colace, Sen[place]ot, FiberCon, etc. If this does not  relieve the constipation, try two tablespoons of Milk Of Magnesia every 8 hours until your bowels move.  . You may shower.  . Do not get in a hot tub, swimming pool, etc. until your doctor agrees. . Do not douche, use tampons, or have sex until your doctor says it is okay, usually about 2 weeks. . Take your pain medicine when you need it. The medicine may not work as well if the pain is bad.  Take the medicines you were taking before surgery. Other medications you might need are pain medications (ibuprofen), medications for constipation (Colace) and nausea medications (Zofran).        AMBULATORY SURGERY  DISCHARGE INSTRUCTIONS   1) The drugs that you were given will stay in your system until tomorrow so for the next 24 hours you should not:  A) Drive an automobile B) Make any legal decisions C) Drink any alcoholic beverage   2) You may resume regular meals tomorrow.  Today it is better to start with liquids and gradually work up to solid foods.  You may eat anything you prefer, but it is better to start with liquids, then soup and crackers, and gradually work up to solid foods.   3) Please notify your doctor immediately if you have any unusual bleeding, trouble breathing, redness and pain at the surgery site, drainage, fever, or pain not relieved by medication.    4) Additional Instructions:          Please contact your physician with any problems or Same Day Surgery at [phone], Monday through Friday 6 am to 4 pm, or [facility] number at [phone]. 

## 2017-12-22 NOTE — Op Note (Signed)
Operative Report Hysteroscopy with Dilation and Curettage   Indications: Postmenopausal bleeding   Pre-operative Diagnosis:  Uterine fibroids Thickened endometrial stripe   Post-operative Diagnosis:  Endometrial polyps Endocervical polyps. Submucosal fibroids  Procedure: 1. Exam under anesthesia 2. Fractional D&C 3. Hysteroscopy 4. Ectocervical polypectomy 5. Myosure polypectomy  Surgeon: Benjaman Kindler, MD  Assistant(s):  None  Anesthesia: General LMA anesthesia  Anesthesiologist: Durenda Hurt, MD Anesthesiologist: Durenda Hurt, MD CRNA: Eben Burow, CRNA  Estimated Blood Loss:  less than 50 mL         Intraoperative medications: IV toradol         Total IV Fluids: see anesthesia report  Urine Output: n/a  Total Fluid Deficit:  350 mL          Specimens: Endocervical curettings, endometrial curettings         Complications:  None; patient tolerated the procedure well.         Disposition: PACU - hemodynamically stable.         Condition: stable  Findings: Uterus measuring 9 cm by sound; normal vagina, perineum. Small ectocervical polyp removed. Multiple small endocervical polyps along the canal.   Atrophic endometrial tissue. Anterior to posterior adhesions in the uterus, and several endometrial polyps. Neither ostium was able to be appreciated. Submucosal fibroids. Small fundal septation.   Indication for procedure/Consents: 72 y.o. with a hx of multiple fibroids and postmenopausal bleeding  here for scheduled surgery for the aforementioned diagnoses.  Risks of surgery were discussed with the patient including but not limited to: bleeding which may require transfusion; infection which may require antibiotics; injury to uterus or surrounding organs; intrauterine scarring which may impair future fertility; need for additional procedures including laparotomy or laparoscopy; and other postoperative/anesthesia complications. Written informed  consent was obtained.    Procedure Details:  D&C/ Myosure  The patient was taken to the operating room where anesthesia was administered and was found to be adequate. After a formal and adequate timeout was performed, she was placed in the dorsal lithotomy position and examined with the above findings. She was then prepped and draped in the sterile manner. Her bladder was catheterized for an estimated amount of clear, yellow urine. A weighed speculum was then placed in the patient's vagina and a single tooth tenaculum was applied to the anterior lip of the cervix. The cervical polyp was removed with a ring forcep.  Her cervix was serially dilated to 15 [place] using Hanks dilators. An ECC was performed. The hysteroscope was introduced under direct observation.  Using lactated ringers as a distention medium to reveal the above findings. The uterine cavity was carefully examined,  and atrophic endometrium with the fibroids (or polyps) above were noted.   These were resected using the Myosure device.  After further careful visualization of the uterine cavity, the hysteroscope was removed under direct visualization.  A sharp curettage was then performed until there was a gritty texture in all four quadrants. The tenaculum was removed from the anterior lip of the cervix and the vaginal speculum was removed after assuring good hemostasis.   The patient tolerated the procedure well and was taken to the recovery area awake and in stable condition. She received iv acetaminophen and Toradol prior to leaving the OR.  The patient will be discharged to home as per PACU criteria. Routine postoperative instructions given. She was prescribed Ibuprofen and Colace. She will follow up in the clinic in two weeks for postoperative evaluation.

## 2017-12-22 NOTE — Interval H&P Note (Signed)
History and Physical Interval Note:  12/22/2017 11:56 AM  Eileen Maldonado  has presented today for surgery, with the diagnosis of postmenopausal bleeding  The various methods of treatment have been discussed with the patient and family. After consideration of risks, benefits and other options for treatment, the patient has consented to  Procedure(s): DILATATION AND CURETTAGE /HYSTEROSCOPY (N/A) as a surgical intervention .  The patient's history has been reviewed, patient examined, no change in status, stable for surgery.  I have reviewed the patient's chart and labs.  Questions were answered to the patient's satisfaction.     Benjaman Kindler

## 2017-12-26 LAB — SURGICAL PATHOLOGY

## 2017-12-26 NOTE — Anesthesia Postprocedure Evaluation (Signed)
Anesthesia Post Note  Patient: Eileen Maldonado  Procedure(s) Performed: DILATATION AND CURETTAGE /HYSTEROSCOPY (N/A )  Patient location during evaluation: PACU Anesthesia Type: General Level of consciousness: awake and alert Pain management: pain level controlled Vital Signs Assessment: post-procedure vital signs reviewed and stable Respiratory status: spontaneous breathing, nonlabored ventilation and respiratory function stable Cardiovascular status: blood pressure returned to baseline and stable Postop Assessment: no apparent nausea or vomiting Anesthetic complications: no     Last Vitals:  Vitals:   12/22/17 1424 12/22/17 1455  BP: (!) 171/95 136/75  Pulse: 66 75  Resp: 16 16  Temp: (!) 36.1 C   SpO2: 98% 99%    Last Pain:  Vitals:   12/25/17 0831  TempSrc:   PainSc: 0-No pain                 Durenda Hurt

## 2018-02-13 IMAGING — DX DG KNEE 1-2V PORT*L*
2 series · 2 of 2 positions shown · non-contrast
Comparison: None.

CLINICAL DATA: Status post left knee replacement

EXAM:
PORTABLE LEFT KNEE - 2 VIEW

[knee ap]
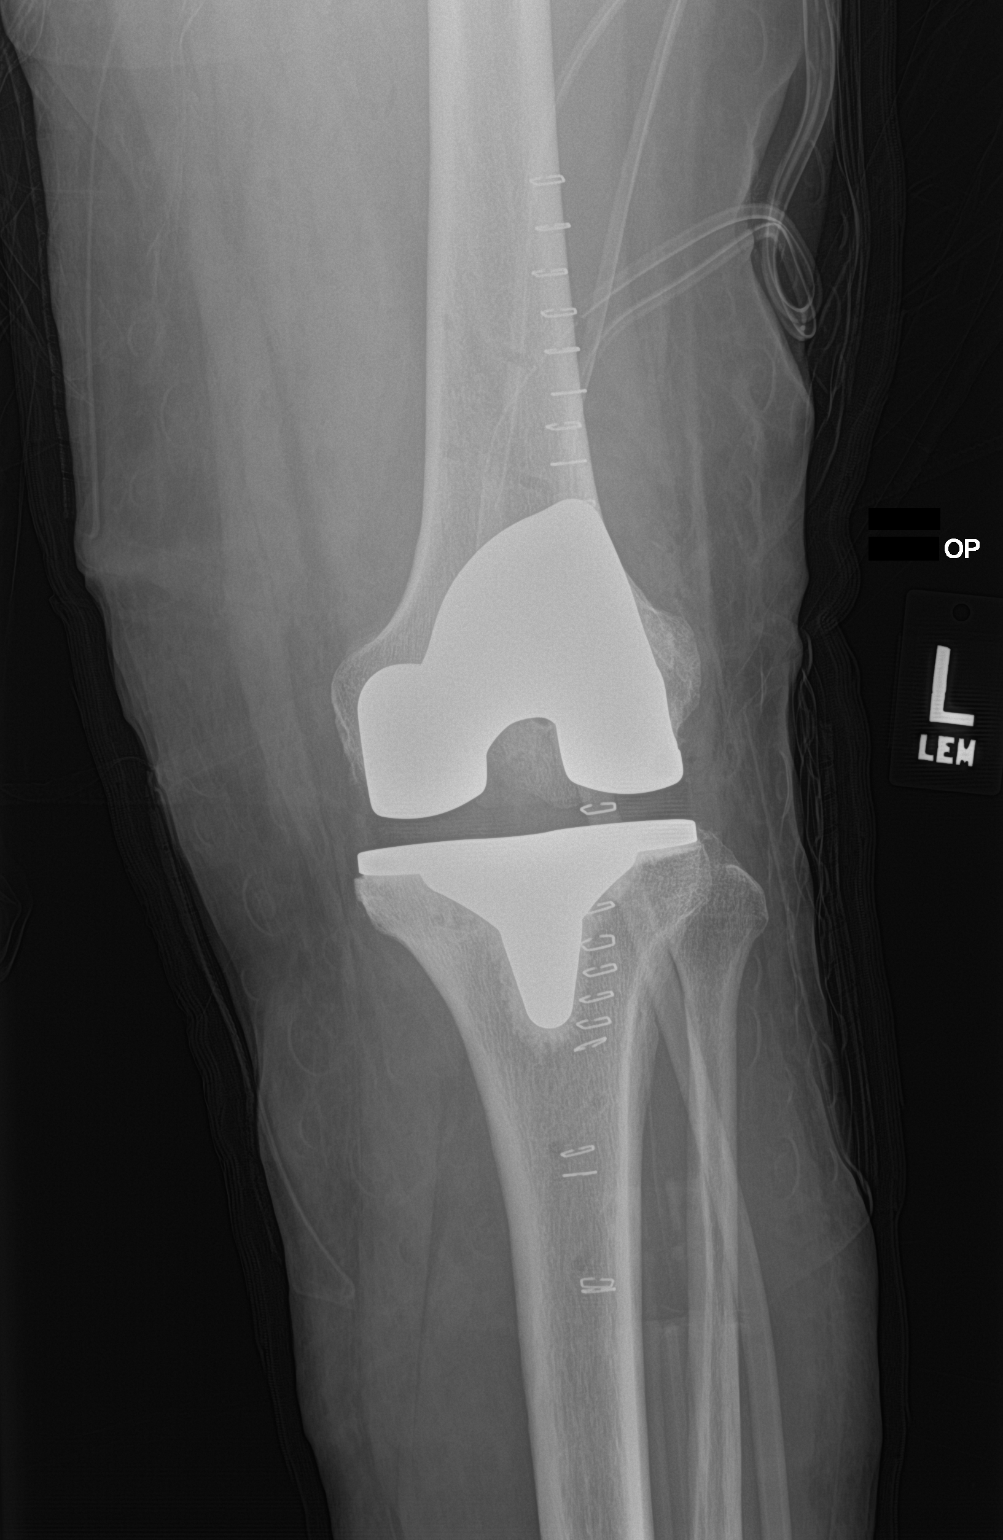

[knee lat]
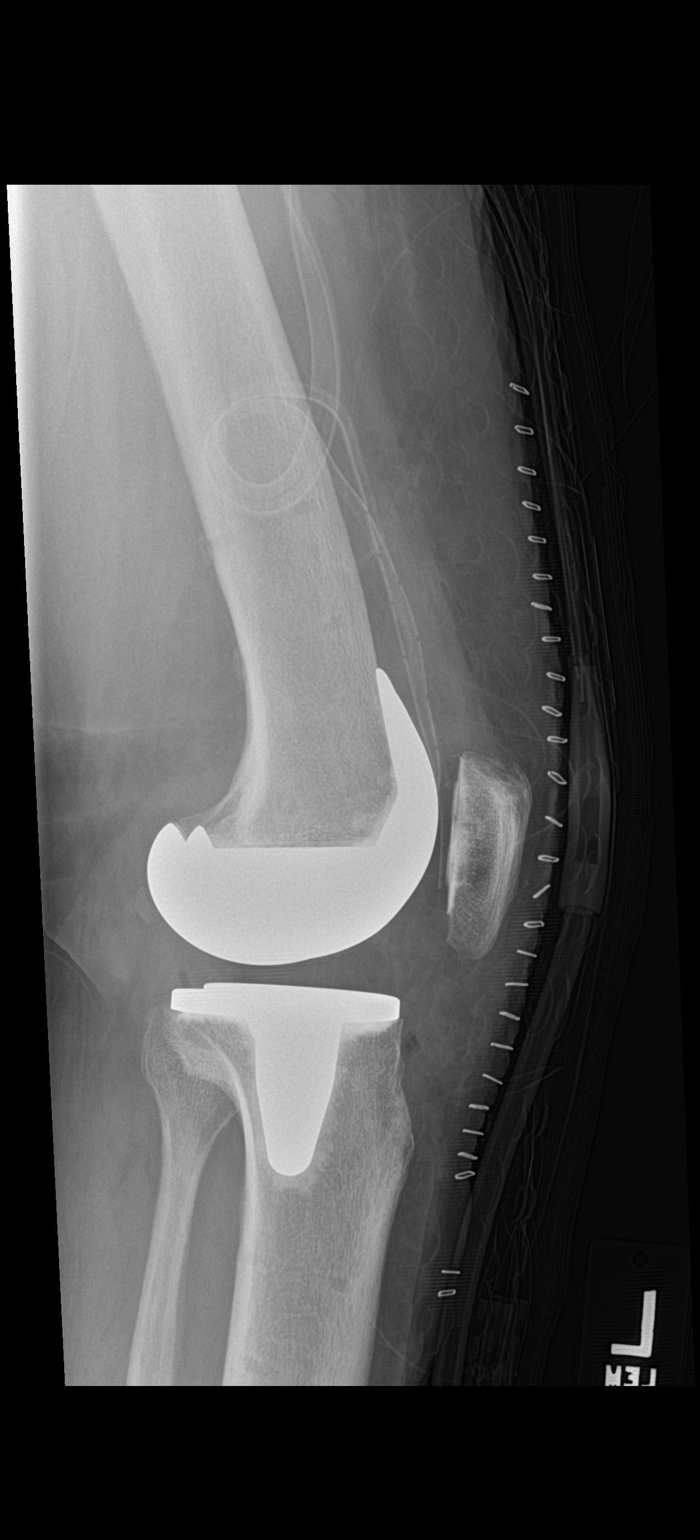

[2 of 2 positions shown; findings below may reference images not displayed]

FINDINGS: Left knee prosthesis is seen in satisfactory position. No acute bony
abnormality is noted. Defects are noted in the distal femur and
proximal tibia consistent with the computer system at deviation.
IMPRESSION: Status post left knee replacement

## 2018-04-10 ENCOUNTER — Ambulatory Visit: Payer: Medicare Other | Admitting: Occupational Therapy

## 2018-04-10 ENCOUNTER — Other Ambulatory Visit: Payer: Self-pay

## 2018-04-10 ENCOUNTER — Ambulatory Visit: Payer: Medicare Other | Attending: Rheumatology | Admitting: Occupational Therapy

## 2018-04-10 ENCOUNTER — Encounter: Payer: Self-pay | Admitting: Occupational Therapy

## 2018-04-10 DIAGNOSIS — M25641 Stiffness of right hand, not elsewhere classified: Secondary | ICD-10-CM

## 2018-04-10 DIAGNOSIS — M05741 Rheumatoid arthritis with rheumatoid factor of right hand without organ or systems involvement: Secondary | ICD-10-CM | POA: Diagnosis present

## 2018-04-10 DIAGNOSIS — M05742 Rheumatoid arthritis with rheumatoid factor of left hand without organ or systems involvement: Secondary | ICD-10-CM | POA: Insufficient documentation

## 2018-04-10 DIAGNOSIS — M25642 Stiffness of left hand, not elsewhere classified: Secondary | ICD-10-CM | POA: Diagnosis present

## 2018-04-10 NOTE — Therapy (Signed)
[facility] [address], [place], [address] Phone: [phone]   Fax:  [phone]  Occupational Therapy Evaluation  Patient Details  Name: Eileen Maldonado MRN: [id number] Date of Birth: 1945-08-16 Referring Provider (OT): [name]   Encounter Date: 04/10/2018  OT End of Session - 04/10/18 1547    Visit Number  1    Number of Visits  1    Date for OT Re-Evaluation  04/10/18    OT Start Time  8315    OT Stop Time  1320    OT Time Calculation (min)  45 min    Activity Tolerance  Patient tolerated treatment well    Behavior During Therapy  [facility] for tasks assessed/performed       Past Medical History:  Diagnosis Date  . Breast cancer ([place])    Left Breast  . Collagen vascular disease ([place])   . Hives   . ILD (interstitial lung disease) ([place])   . Pneumonia   . PVD (peripheral vascular disease) ([place])   . RA (rheumatoid arthritis) ([place])   . Raynaud's phenomenon   . Right thyroid nodule     Past Surgical History:  Procedure Laterality Date  . APPENDECTOMY    . appy    . breast biopsy    . BREAST SURGERY Left    biopsy of milk duct  . hernia repair    . HERNIA REPAIR     Umbilical Hernia  . HYSTEROSCOPY W/D&C N/A 12/22/2017   Procedure: DILATATION AND CURETTAGE /HYSTEROSCOPY;  Surgeon: Benjaman Kindler, MD;  Location: [facility] ORS;  Service: Gynecology;  Laterality: N/A;  . KNEE ARTHROPLASTY Left 07/06/2016   Procedure: COMPUTER ASSISTED TOTAL KNEE ARTHROPLASTY;  Surgeon: Dereck Leep, MD;  Location: [facility] ORS;  Service: Orthopedics;  Laterality: Left;    There were no vitals filed for this visit.  Subjective Assessment - 04/10/18 1542    Subjective   I lost my ulnar deviation splints for my hands -and can tell difference - and for you to check my hands again - to see if they are worse     Patient Stated Goals  Want you to compare to last year my strength and ROM     Currently in Pain?  No/denies         [facility] OT Assessment - 04/10/18 0001      Assessment   Medical Diagnosis  bilateral hand pain and RA     Referring Provider (OT)  [name]    Onset Date/Surgical Date  07/26/16    Hand Dominance  Right    Prior Therapy  seen in once year since [place] With  Spouse      Prior Function   Leisure  word search, house work       Strength   Right Hand Grip (lbs)  50    Right Hand Lateral Pinch  17 lbs    Right Hand 3 Point Pinch  12 lbs    Left Hand Grip (lbs)  37    Left Hand Lateral Pinch  13 lbs    Left Hand 3 Point Pinch  9 lbs      Right Hand AROM   R Index  MCP 0-90  90 Degrees    R Index PIP 0-100  100 Degrees    R Long  MCP 0-90  90 Degrees    R Long PIP 0-100  100 Degrees    R Ring  MCP 0-90  90 Degrees    R Ring PIP 0-100  100 Degrees    R Little  MCP 0-90  100 Degrees    R Little PIP 0-100  85 Degrees      Left Hand AROM   L Index  MCP 0-90  90 Degrees    L Index PIP 0-100  100 Degrees    L Long  MCP 0-90  90 Degrees    L Long PIP 0-100  100 Degrees    L Ring  MCP 0-90  90 Degrees    L Ring PIP 0-100  100 Degrees    L Little  MCP 0-90  100 Degrees    L Little PIP 0-100  90 Degrees          pt to cont with joint protection - use large joint , hold object 90 degrees with knuckles, turn doorknob or faucets to thumb side   Fitted with new  anti ulnar deviation splints on bilateral hands - pt for the last  1 1/2 yrs slept with them  But recommend pt to wear during day with act - it aligned her digits with use - until they wore in   RD of digits to be done 2 x day 5-8 reps                OT Education - 04/10/18 1546    Education provided  Yes    Education Details  splint wearing and findings compare to prior times     Person(s) Educated  Patient    Methods  Explanation;Demonstration;Handout    Comprehension  Verbalized understanding;Returned demonstration          OT Long Term Goals - 04/10/18 1550       OT LONG TERM GOAL #1   Title  Pt verbalize understanding for homeprogram to cont with and donning and doffing of anti ulnar devation splints fitted this date     Baseline  met - able to demo    Status  Achieved    Target Date  04/10/18            Plan - 04/10/18 1547    Clinical Impression Statement  Pt refer by Dr [name] - was seen about year an 1/2 ago - for bilateral hands - subluxation of extensor tendons with 5th digit the worse - pt lost her anti ulnar deviation splint - pt was fitted with them and was able to extend her fingers with splints on -her grip increaese in bilateral hands and prehension  on R hand - pt fitted with new splint and review findings  - pt to cont with homeprogram     Plan  pt to cont  with homeprogram     Clinical Decision Making  Limited treatment options, no task modification necessary    OT Home Exercise Plan  splints wearing     Consulted and Agree with Plan of Care  Patient       Patient will benefit from skilled therapeutic intervention in order to improve the following deficits and impairments:     Visit Diagnosis: Stiffness of right hand, not elsewhere classified - Plan: Ot plan of care cert/re-cert  Stiffness of left hand, not elsewhere classified - Plan: Ot plan of care cert/re-cert  Rheumatoid arthritis involving both hands with positive rheumatoid factor ([place]) - Plan: Ot plan of care cert/re-cert    Problem List Patient Active Problem  List   Diagnosis Date Noted  . S/P total knee arthroplasty 07/06/2016  . Ileitis 01/31/2016  . Rheumatoid arthritis ([place]) 07/28/2014    Rosalyn Gess OTR/L,CLT 04/10/2018, 3:52 PM   [facility] [address], [place], [address] Phone: [phone]   Fax:  [phone]  Name: Eileen Maldonado MRN: [id number] Date of Birth: 18-Dec-1945

## 2018-04-10 NOTE — Patient Instructions (Signed)
Cont to do same HEP  Joint protection  RD of digits  And use anti ulnar deviation splints as needed

## 2018-09-27 ENCOUNTER — Other Ambulatory Visit: Payer: Self-pay

## 2018-09-27 ENCOUNTER — Ambulatory Visit (INDEPENDENT_AMBULATORY_CARE_PROVIDER_SITE_OTHER): Payer: Medicare Other | Admitting: Licensed Clinical Social Worker

## 2018-09-27 DIAGNOSIS — Z634 Disappearance and death of family member: Secondary | ICD-10-CM | POA: Diagnosis not present

## 2018-10-05 ENCOUNTER — Ambulatory Visit: Payer: Medicare Other | Admitting: Licensed Clinical Social Worker

## 2018-10-05 ENCOUNTER — Other Ambulatory Visit: Payer: Self-pay

## 2018-11-05 ENCOUNTER — Ambulatory Visit: Payer: Medicare Other | Admitting: Licensed Clinical Social Worker

## 2018-11-14 NOTE — Progress Notes (Signed)
[facility] Initial Clinical Assessment  MRN: [id number] NAME: Eileen Maldonado Date: 09/27/18  Start time:  10a End time:  11a Total time:  1 hour Call number:   [phone] Type of Contact:  video Patient consent obtained:  yes Reason for Visit today:  establish care  Treatment History Patient recently received Inpatient Treatment:    Facility/Program:    Date of discharge:   Patient currently being seen by therapist/psychiatrist:   Patient currently receiving the following services:    Past Psychiatric History/Hospitalization(s): Anxiety: Yes Bipolar Disorder: No Depression: Yes Mania: No Psychosis: No Schizophrenia: No Personality Disorder: No Hospitalization for psychiatric illness: No History of Electroconvulsive Shock Therapy: No Prior Suicide Attempts: No  Clinical Assessment:  PHQ-9 Assessments: No flowsheet data found.  GAD-7 Assessments: No flowsheet data found.   Social Functioning Social maturity:  isolates Social judgement:  fair  Stress Current stressors:  bereavement Familial stressors:  bereavement, limited contact with family Sleep:  cycling Appetite:  fair Coping ability:  overwhelmed Patient taking medications as prescribed:    Current medications:  Outpatient Encounter Medications as of 09/27/2018  Medication Sig  . Abatacept (ORENCIA [place]) Inject 1 Dose into the skin once a week.  Marland Kitchen albuterol (PROVENTIL HFA;VENTOLIN HFA) 108 (90 Base) MCG/ACT inhaler Inhale 2 puffs into the lungs every 6 (six) hours as needed for wheezing or shortness of breath.  . Cholecalciferol (VITAMIN D3) 1000 units CAPS Take 1,000 Units by mouth daily.  Marland Kitchen conjugated estrogens (PREMARIN) vaginal cream Place 0.5 g vaginally 2 (two) times a week.  Marland Kitchen Dextran 70-Hypromellose (ARTIFICIAL TEARS) 0.1-0.3 % SOLN Place 1 drop into both eyes daily as needed for dry eyes.  Marland Kitchen docusate sodium (COLACE) 100 MG capsule Take 1 capsule (100 mg total) by mouth 2 (two) times  daily. To keep stools soft  . fluticasone (FLONASE) 50 MCG/ACT nasal spray Place 1 spray into both nostrils daily as needed for allergies or rhinitis.   . Fluticasone-Salmeterol (ADVAIR) 250-50 MCG/DOSE AEPB Inhale 1 puff into the lungs every 12 (twelve) hours.   . gabapentin (NEURONTIN) 300 MG capsule Take 300 mg by mouth 3 (three) times daily.  . hydrochlorothiazide (HYDRODIURIL) 25 MG tablet Take 25 mg by mouth daily.  Marland Kitchen leflunomide (ARAVA) 10 MG tablet Take 10 mg by mouth daily.   Marland Kitchen loperamide (IMODIUM A-D) 2 MG tablet Take 2 mg by mouth 2 (two) times daily.  . montelukast (SINGULAIR) 10 MG tablet Take 10 mg by mouth at bedtime.  . Probiotic Product ([place]) CAPS Take 1 capsule by mouth daily.   No facility-administered encounter medications on file as of 09/27/2018.     Self-harm Behaviors Risk Assessment Self-harm risk factors:   Patient endorses recent thoughts of harming self:    [place] Suicide Severity Rating Scale: No flowsheet data found.  Danger to Others Risk Assessment Danger to others risk factors:   Patient endorses recent thoughts of harming others:    Dynamic Appraisal of Situational Aggression (DASA): No flowsheet data found.  Substance Use Assessment Patient recently consumed alcohol:    Alcohol Use Disorder Identification Test (AUDIT): No flowsheet data found. Patient recently used drugs:    Opioid Risk Assessment:  Patient is concerned about dependence or abuse of substances:    ASAM Multidimensional Assessment Summary:  Dimension 1:    Dimension 1 Rating:    Dimension 2:    Dimension 2 Rating:    Dimension 3:    Dimension 3 Rating:  Dimension 4:    Dimension 4 Rating:    Dimension 5:    Dimension 5 Rating:    Dimension 6:    Dimension 6 Rating:   ASAM's Severity Rating Score:   ASAM Recommended Level of Treatment:     Goals, Interventions and Follow-up Plan Goals: Increase healthy adjustment to current life  circumstances Interventions: Motivational Interviewing and Brief CBT Follow-up Plan: Refer to [facility] Outpatient Therapy  Summary of Clinical Assessment Summary:Lost husband June 28 2018.  Reports that her life ended that day.  Together 5 yrs.  Not expected. Trying to cope.  Lives alone.  "do what I have to do."  Lives in the home that she originally shared with husband for 28 yrs.  Husband was self employed.  Has auto repair business.  Has lost weight.  He was in hospital for 2 months.  Was going back and forth to and from hospital.  Lost 15-20 pounds.  Weighs 140.  Is slowly gaining.  Cooks for self.  Goes to bed late around 1 or 2 am. Takes melatonin.  Has an app to help her sleep.  Wakes up around 830am.  Wants to get her "will, in order."  Has to make changes in her will.  2 biological children and a adopted child (grandchild) (Erna 52, Ernie 98, Ernae 24).  Loves her children and seems to love me.  Lawanna Kobus has 1 adopted child.  Ernie has [place] and a 62 yr old.  Son is incarcerated. Talks to son about 2 times per week.  Yolanda Bonine Physicist, medical) lives next door with his mother. Has a good relationship with Jaxon and his mother. Has not seen son since December 2019.   Husband was in pain and requested to go to [facility].  He was 100% disabled Vet. He was coherent and struggled with pain.  He was in Surgical Intensive Care for 2 weeks. Reports having a gallstone that spewed bowel into his abdomen.  Had numerous procedures.  Had infection/inflammation in his abdomen.  He stayed at the hospital until he passed.  He had a stroke from sepsis.  She took him off life support since a portion of his brain was destroyed that controlled his speech and paralyzed him on one side.  Did not have a funeral due to [place] [facility].  Husband was cremated. Had an urn made for him.  Has no emotional support. Has 1 friend in [place].  Talks to her.  Goes to her home for dinner.  Originally from [place], [place].  Had a good childhood.  Only child.  Wanted for nothing.  Grew up with both parents in the household. Graduated from [employer] in [place].  Attended community college; studied teaching; did not complete school; began working at [employer] in [employer].  Husband was in [employer] ([place]).  Married 1 week prior to him leaving for [place].  Was in war for 2 years. Husband father and uncle opened a business in [place].  Largest black owned business.  "He could do it all. There was nothing he couldn't do."  Husband moved next door with his family.  Husband from [place].  Purchased property from husband's father's ex-wife. Ex-Stepmother. Has a daughter Lawanna Kobus).  Lives in [place].  Goals in therapy: understand why he had to die.  He had crohn's disease from early childhood. On numerous medications.   Attempted Bereavement Counseling but was only able to receive 1 session from [facility].  [place], LCSW

## 2019-03-19 ENCOUNTER — Ambulatory Visit: Payer: Medicare Other | Attending: Internal Medicine

## 2019-03-19 DIAGNOSIS — Z20822 Contact with and (suspected) exposure to covid-19: Secondary | ICD-10-CM

## 2019-03-21 LAB — NOVEL CORONAVIRUS, NAA: SARS-CoV-2, NAA: NOT DETECTED

## 2019-08-13 DIAGNOSIS — M48061 Spinal stenosis, lumbar region without neurogenic claudication: Secondary | ICD-10-CM | POA: Insufficient documentation

## 2019-08-13 DIAGNOSIS — M48062 Spinal stenosis, lumbar region with neurogenic claudication: Secondary | ICD-10-CM | POA: Insufficient documentation

## 2019-08-13 DIAGNOSIS — G8929 Other chronic pain: Secondary | ICD-10-CM | POA: Insufficient documentation

## 2019-11-20 DIAGNOSIS — Z79899 Other long term (current) drug therapy: Secondary | ICD-10-CM | POA: Insufficient documentation

## 2019-12-20 ENCOUNTER — Ambulatory Visit: Payer: Medicare Other

## 2020-10-01 ENCOUNTER — Other Ambulatory Visit (INDEPENDENT_AMBULATORY_CARE_PROVIDER_SITE_OTHER): Payer: Self-pay | Admitting: Nurse Practitioner

## 2020-10-01 DIAGNOSIS — I83812 Varicose veins of left lower extremities with pain: Secondary | ICD-10-CM

## 2020-10-05 ENCOUNTER — Ambulatory Visit (INDEPENDENT_AMBULATORY_CARE_PROVIDER_SITE_OTHER): Payer: Medicare Other

## 2020-10-05 ENCOUNTER — Ambulatory Visit (INDEPENDENT_AMBULATORY_CARE_PROVIDER_SITE_OTHER): Payer: Medicare Other | Admitting: Nurse Practitioner

## 2020-10-05 ENCOUNTER — Other Ambulatory Visit: Payer: Self-pay

## 2020-10-05 VITALS — BP 162/78 | HR 87 | Ht 63.0 in | Wt 142.0 lb

## 2020-10-05 DIAGNOSIS — I83812 Varicose veins of left lower extremities with pain: Secondary | ICD-10-CM | POA: Diagnosis not present

## 2020-10-11 ENCOUNTER — Encounter (INDEPENDENT_AMBULATORY_CARE_PROVIDER_SITE_OTHER): Payer: Self-pay | Admitting: Nurse Practitioner

## 2020-10-11 NOTE — Progress Notes (Signed)
Subjective:    Patient ID: Eileen Maldonado, female    DOB: May 18, 1945, 75 y.o.   MRN: [id number] Chief Complaint  Patient presents with   New Patient (Initial Visit)    NP reflux and consult LLE VV with pain reffered by [place]    The patient is seen for evaluation of symptomatic varicose veins. The patient relates burning and stinging which worsened steadily throughout the course of the day, particularly with standing.  Patient notes that they are tender to the touch.  The patient also notes an aching and throbbing pain over the varicosities, particularly with prolonged dependent positions. The symptoms are significantly improved with elevation.  The patient also notes that during hot weather the symptoms are greatly intensified. The patient states the pain from the varicose veins interferes with work, daily exercise, shopping and household maintenance. At this point, the symptoms are persistent and severe enough that they're having a negative impact on lifestyle and are interfering with daily activities.  There is no history of DVT, PE or superficial thrombophlebitis. There is no history of ulceration or hemorrhage. The patient denies a significant family history of varicose veins.   The patient has not worn graduated compression socks since gently in the past. At the present time the patient has not been using over-the-counter analgesics. There is no history of prior surgical intervention or sclerotherapy.  The patient had noninvasive studies today which shows no evidence of DVT or superficial thrombophlebitis in her left lower extremity.  No evidence of deep venous insufficiency or superficial venous reflux.   Review of Systems  Musculoskeletal:  Positive for arthralgias.  All other systems reviewed and are negative.     Objective:   Physical Exam Vitals reviewed.  HENT:     Head: Normocephalic.  Cardiovascular:     Rate and Rhythm: Normal rate.     Pulses: Normal  pulses.  Pulmonary:     Effort: Pulmonary effort is normal.  Musculoskeletal:        General: Tenderness present.  Neurological:     Mental Status: She is alert and oriented to person, place, and time.  Psychiatric:        Mood and Affect: Mood normal.        Behavior: Behavior normal.        Thought Content: Thought content normal.        Judgment: Judgment normal.    BP (!) 162/78   Pulse 87   Ht 5\' 3"  (1.6 m)   Wt 142 lb (64.4 kg)   BMI 25.15 kg/m   Past Medical History:  Diagnosis Date   Breast cancer ([place])    Left Breast   Collagen vascular disease ([place])    Hives    ILD (interstitial lung disease) ([place])    Pneumonia    PVD (peripheral vascular disease) ([facility])    RA (rheumatoid arthritis) ([place])    Raynaud's phenomenon    Right thyroid nodule     Social History   Socioeconomic History   Marital status: Married    Spouse name: Not on file   Number of children: Not on file   Years of education: Not on file   Highest education level: Not on file  Occupational History   Not on file  Tobacco Use   Smoking status: Never   Smokeless tobacco: Never  Vaping Use   Vaping Use: Never used  Substance and Sexual Activity   Alcohol use: No   Drug use: No  Sexual activity: Not on file  Other Topics Concern   Not on file  Social History Narrative   Not on file   Social Determinants of Health   Financial Resource Strain: Not on file  Food Insecurity: Not on file  Transportation Needs: Not on file  Physical Activity: Not on file  Stress: Not on file  Social Connections: Not on file  Intimate Partner Violence: Not on file    Past Surgical History:  Procedure Laterality Date   APPENDECTOMY     appy     breast biopsy     BREAST SURGERY Left    biopsy of milk duct   hernia repair     HERNIA REPAIR     Umbilical Hernia   HYSTEROSCOPY WITH D & C N/A 12/22/2017   Procedure: DILATATION AND CURETTAGE /HYSTEROSCOPY;  Surgeon: Benjaman Kindler, MD;  Location: [facility]  ORS;  Service: Gynecology;  Laterality: N/A;   KNEE ARTHROPLASTY Left 07/06/2016   Procedure: COMPUTER ASSISTED TOTAL KNEE ARTHROPLASTY;  Surgeon: Dereck Leep, MD;  Location: [facility] ORS;  Service: Orthopedics;  Laterality: Left;    Family History  Problem Relation Age of Onset   Breast cancer Mother    Hypertension Mother    Stroke Father    Hypertension Father     Allergies  Allergen Reactions   Amlodipine Swelling   Lisinopril Other (See Comments)   Bupropion Other (See Comments)    Insomnia Insomnia    Celebrex [Celecoxib] Other (See Comments)    Hair loss and cough   Doxycycline Other (See Comments)    nightmares Nightmares    Methotrexate Derivatives Other (See Comments)    Hair loss and cough   Etanercept Rash   Latex Rash    From a knee brace    CBC Latest Ref Rng & Units 12/22/2017 12/18/2017 07/08/2016  WBC 3.6 - 11.0 K/uL - 7.3 10.1  Hemoglobin 12.0 - 15.0 g/dL 14.6 14.5 11.7(L)  Hematocrit 36.0 - 46.0 % 43.0 43.6 35.3  Platelets 150 - 440 K/uL - 258 125(L)      CMP     Component Value Date/Time   NA 138 12/22/2017 1034   K 3.3 (L) 12/22/2017 1034   CL 104 12/19/2017 1525   CO2 29 12/19/2017 1525   GLUCOSE 100 (H) 12/22/2017 1034   BUN 19 12/19/2017 1525   CREATININE 1.06 (H) 12/19/2017 1525   CALCIUM 9.1 12/19/2017 1525   PROT 7.8 06/22/2016 0856   ALBUMIN 4.1 06/22/2016 0856   AST 25 06/22/2016 0856   ALT 21 06/22/2016 0856   ALKPHOS 76 06/22/2016 0856   BILITOT 0.5 06/22/2016 0856   GFRNONAA 51 (L) 12/19/2017 1525   GFRAA 59 (L) 12/19/2017 1525     No results found.     Assessment & Plan:   1. Varicose veins of left lower extremity with pain  Recommend:  The patient has large symptomatic varicose veins that are painful and associated with swelling.  I have had a long discussion with the patient regarding  varicose veins and why they cause symptoms.  Patient will begin wearing graduated compression stockings class 1 on a daily basis,  beginning first thing in the morning and removing them in the evening. The patient is instructed specifically not to sleep in the stockings.    The patient  will also begin using over-the-counter analgesics such as Motrin 600 mg po TID to help control the symptoms.    In addition, behavioral modification including elevation during  the day will be initiated.    Pending the results of these changes the  patient will be reevaluated in three months.   An  ultrasound of the venous system will be obtained.   Further plans will be based on the ultrasound results and whether conservative therapies are successful at eliminating the pain and swelling.     Current Outpatient Medications on File Prior to Visit  Medication Sig Dispense Refill   Abatacept (ORENCIA [place]) Inject 1 Dose into the skin once a week.     albuterol (PROVENTIL HFA;VENTOLIN HFA) 108 (90 Base) MCG/ACT inhaler Inhale 2 puffs into the lungs every 6 (six) hours as needed for wheezing or shortness of breath.     Cholecalciferol (VITAMIN D3) 1000 units CAPS Take 1,000 Units by mouth daily.     citalopram (CELEXA) 10 MG tablet Take 1 tablet by mouth daily.     conjugated estrogens (PREMARIN) vaginal cream Place 0.5 g vaginally 2 (two) times a week.     Dextran 70-Hypromellose 0.1-0.3 % SOLN Place 1 drop into both eyes daily as needed for dry eyes.     diclofenac (VOLTAREN) 50 MG EC tablet Take by mouth.     docusate sodium (COLACE) 100 MG capsule Take 1 capsule (100 mg total) by mouth 2 (two) times daily. To keep stools soft 30 capsule 0   fluticasone (FLONASE) 50 MCG/ACT nasal spray Place 1 spray into both nostrils daily as needed for allergies or rhinitis.      Fluticasone-Salmeterol (ADVAIR) 250-50 MCG/DOSE AEPB Inhale 1 puff into the lungs every 12 (twelve) hours.     gabapentin (NEURONTIN) 300 MG capsule Take 300 mg by mouth 3 (three) times daily.     hydrochlorothiazide (HYDRODIURIL) 25 MG tablet Take 25 mg by mouth daily.      HYDROcodone-acetaminophen (NORCO/VICODIN) 5-325 MG tablet Take by mouth.     leflunomide (ARAVA) 10 MG tablet Take 10 mg by mouth daily.      Lifitegrast (XIIDRA) 5 % SOLN Apply to eye.     loperamide (IMODIUM A-D) 2 MG tablet Take 2 mg by mouth 2 (two) times daily.     montelukast (SINGULAIR) 10 MG tablet Take 10 mg by mouth at bedtime.     Probiotic Product ([place]) CAPS Take 1 capsule by mouth daily.     No current facility-administered medications on file prior to visit.    There are no Patient Instructions on file for this visit. No follow-ups on file.   Kris Hartmann, NP

## 2021-01-07 ENCOUNTER — Ambulatory Visit (INDEPENDENT_AMBULATORY_CARE_PROVIDER_SITE_OTHER): Admitting: Nurse Practitioner

## 2021-01-07 ENCOUNTER — Encounter (INDEPENDENT_AMBULATORY_CARE_PROVIDER_SITE_OTHER): Payer: Medicare Other

## 2021-01-07 ENCOUNTER — Ambulatory Visit (INDEPENDENT_AMBULATORY_CARE_PROVIDER_SITE_OTHER): Payer: Medicare Other | Admitting: Nurse Practitioner

## 2021-02-01 ENCOUNTER — Other Ambulatory Visit (INDEPENDENT_AMBULATORY_CARE_PROVIDER_SITE_OTHER): Payer: Self-pay | Admitting: Nurse Practitioner

## 2021-02-01 DIAGNOSIS — I83812 Varicose veins of left lower extremities with pain: Secondary | ICD-10-CM

## 2021-02-02 ENCOUNTER — Ambulatory Visit (INDEPENDENT_AMBULATORY_CARE_PROVIDER_SITE_OTHER): Payer: Medicare Other

## 2021-02-02 ENCOUNTER — Other Ambulatory Visit: Payer: Self-pay

## 2021-02-02 ENCOUNTER — Encounter (INDEPENDENT_AMBULATORY_CARE_PROVIDER_SITE_OTHER): Payer: Self-pay | Admitting: Nurse Practitioner

## 2021-02-02 ENCOUNTER — Ambulatory Visit (INDEPENDENT_AMBULATORY_CARE_PROVIDER_SITE_OTHER): Payer: Medicare Other | Admitting: Nurse Practitioner

## 2021-02-02 VITALS — BP 134/79 | HR 80 | Resp 16 | Wt 141.0 lb

## 2021-02-02 DIAGNOSIS — I83812 Varicose veins of left lower extremities with pain: Secondary | ICD-10-CM | POA: Diagnosis not present

## 2021-02-13 ENCOUNTER — Encounter (INDEPENDENT_AMBULATORY_CARE_PROVIDER_SITE_OTHER): Payer: Self-pay | Admitting: Nurse Practitioner

## 2021-02-13 NOTE — Progress Notes (Signed)
Subjective:    Patient ID: Eileen Maldonado, female    DOB: 1945/06/15, 75 y.o.   MRN: [id number] Chief Complaint  Patient presents with   Follow-up    Ultrasound follow up    The patient returns for followup evaluation 3 months after the initial visit.  The patient has previously had ablated left great saphenous vein.  The patient continues to have pain in the lower extremities with dependency. The pain is lessened with elevation. Graduated compression stockings, Class I (20-30 mmHg), have been worn but the stockings do not eliminate the leg pain. Over-the-counter analgesics do not improve the symptoms. The degree of discomfort continues to interfere with daily activities. The patient notes the pain in the legs is causing problems with daily exercise, at the workplace and even with household activities and maintenance such as standing in the kitchen preparing meals and doing dishes.   Venous ultrasound shows normal deep venous system, no evidence of acute or chronic DVT.  Previously ablated left lower extremity great saphenous vein shows evidence of continued ablation.  Superficial reflux is present in the mid rear calf varicosity   Review of Systems  Cardiovascular:  Positive for leg swelling.  All other systems reviewed and are negative.     Objective:   Physical Exam Vitals reviewed.  HENT:     Head: Normocephalic.  Cardiovascular:     Rate and Rhythm: Normal rate.     Pulses: Normal pulses.  Pulmonary:     Effort: Pulmonary effort is normal.  Musculoskeletal:        General: Tenderness present.  Skin:    General: Skin is warm and dry.  Neurological:     Mental Status: She is alert and oriented to person, place, and time.  Psychiatric:        Mood and Affect: Mood normal.        Behavior: Behavior normal.        Thought Content: Thought content normal.        Judgment: Judgment normal.    BP 134/79 (BP Location: Right Arm)   Pulse 80   Resp 16   Wt 141 lb (64  kg)   BMI 24.98 kg/m   Past Medical History:  Diagnosis Date   Breast cancer ([place])    Left Breast   Collagen vascular disease ([place])    Hives    ILD (interstitial lung disease) ([facility])    Pneumonia    PVD (peripheral vascular disease) ([facility])    RA (rheumatoid arthritis) ([place])    Raynaud's phenomenon    Right thyroid nodule     Social History   Socioeconomic History   Marital status: Married    Spouse name: Not on file   Number of children: Not on file   Years of education: Not on file   Highest education level: Not on file  Occupational History   Not on file  Tobacco Use   Smoking status: Never   Smokeless tobacco: Never  Vaping Use   Vaping Use: Never used  Substance and Sexual Activity   Alcohol use: No   Drug use: No   Sexual activity: Not on file  Other Topics Concern   Not on file  Social History Narrative   Not on file   Social Determinants of Health   Financial Resource Strain: Not on file  Food Insecurity: Not on file  Transportation Needs: Not on file  Physical Activity: Not on file  Stress: Not on file  Social Connections: Not on file  Intimate Partner Violence: Not on file    Past Surgical History:  Procedure Laterality Date   APPENDECTOMY     appy     breast biopsy     BREAST SURGERY Left    biopsy of milk duct   hernia repair     HERNIA REPAIR     Umbilical Hernia   HYSTEROSCOPY WITH D & C N/A 12/22/2017   Procedure: DILATATION AND CURETTAGE /HYSTEROSCOPY;  Surgeon: Benjaman Kindler, MD;  Location: [facility];  Service: Gynecology;  Laterality: N/A;   KNEE ARTHROPLASTY Left 07/06/2016   Procedure: COMPUTER ASSISTED TOTAL KNEE ARTHROPLASTY;  Surgeon: Dereck Leep, MD;  Location: [facility];  Service: Orthopedics;  Laterality: Left;    Family History  Problem Relation Age of Onset   Breast cancer Mother    Hypertension Mother    Stroke Father    Hypertension Father     Allergies  Allergen Reactions   Amlodipine Swelling   Lisinopril  Other (See Comments)   Celebrex [Celecoxib] Other (See Comments)    Hair loss and cough   Methotrexate Derivatives Other (See Comments)    Hair loss and cough   Bupropion Other (See Comments)    Insomnia Insomnia  Other reaction(s): Other (See Comments) Insomnia   Doxycycline Other (See Comments)    nightmares Nightmares  Other reaction(s): Other (See Comments) Nightmares   Etanercept Rash   Latex Rash    From a knee brace    CBC Latest Ref Rng & Units 12/22/2017 12/18/2017 07/08/2016  WBC 3.6 - 11.0 K/uL - 7.3 10.1  Hemoglobin 12.0 - 15.0 g/dL 14.6 14.5 11.7(L)  Hematocrit 36.0 - 46.0 % 43.0 43.6 35.3  Platelets 150 - 440 K/uL - 258 125(L)      CMP     Component Value Date/Time   NA 138 12/22/2017 1034   K 3.3 (L) 12/22/2017 1034   CL 104 12/19/2017 1525   CO2 29 12/19/2017 1525   GLUCOSE 100 (H) 12/22/2017 1034   BUN 19 12/19/2017 1525   CREATININE 1.06 (H) 12/19/2017 1525   CALCIUM 9.1 12/19/2017 1525   PROT 7.8 06/22/2016 0856   ALBUMIN 4.1 06/22/2016 0856   AST 25 06/22/2016 0856   ALT 21 06/22/2016 0856   ALKPHOS 76 06/22/2016 0856   BILITOT 0.5 06/22/2016 0856   GFRNONAA 51 (L) 12/19/2017 1525   GFRAA 59 (L) 12/19/2017 1525     No results found.     Assessment & Plan:   1. Varicose veins of left lower extremity with pain Recommend:  The patient has had successful ablation of the previously incompetent saphenous venous system but still has persistent symptoms of pain and swelling that are having a negative impact on daily life and daily activities.  Patient should undergo injection sclerotherapy to treat the residual varicosities.  The risks, benefits and alternative therapies were reviewed in detail with the patient.  All questions were answered.  The patient agrees to proceed with sclerotherapy at their convenience.  The patient will continue wearing the graduated compression stockings and using the over-the-counter pain medications to treat her  symptoms.        Current Outpatient Medications on File Prior to Visit  Medication Sig Dispense Refill   Abatacept (ORENCIA [place]) Inject 1 Dose into the skin once a week.     albuterol (PROVENTIL HFA;VENTOLIN HFA) 108 (90 Base) MCG/ACT inhaler Inhale 2 puffs into the lungs every 6 (six) hours as needed  for wheezing or shortness of breath.     Cholecalciferol (VITAMIN D3) 1000 units CAPS Take 1,000 Units by mouth daily.     citalopram (CELEXA) 10 MG tablet Take 1 tablet by mouth daily.     conjugated estrogens (PREMARIN) vaginal cream Place 0.5 g vaginally 2 (two) times a week.     Dextran 70-Hypromellose 0.1-0.3 % SOLN Place 1 drop into both eyes daily as needed for dry eyes.     diclofenac (VOLTAREN) 50 MG EC tablet Take by mouth.     docusate sodium (COLACE) 100 MG capsule Take 1 capsule (100 mg total) by mouth 2 (two) times daily. To keep stools soft 30 capsule 0   fluticasone (FLONASE) 50 MCG/ACT nasal spray Place 1 spray into both nostrils daily as needed for allergies or rhinitis.      fluticasone furoate-vilanterol (BREO ELLIPTA) 100-25 MCG/ACT AEPB Inhale into the lungs.     Fluticasone-Salmeterol (ADVAIR) 250-50 MCG/DOSE AEPB Inhale 1 puff into the lungs every 12 (twelve) hours.     gabapentin (NEURONTIN) 300 MG capsule Take 300 mg by mouth 3 (three) times daily.     hydrochlorothiazide (HYDRODIURIL) 25 MG tablet Take 25 mg by mouth daily.     HYDROcodone-acetaminophen (NORCO/VICODIN) 5-325 MG tablet Take by mouth.     leflunomide (ARAVA) 10 MG tablet Take 10 mg by mouth daily.      Lifitegrast (XIIDRA) 5 % SOLN Apply to eye.     loperamide (IMODIUM A-D) 2 MG tablet Take 2 mg by mouth 2 (two) times daily.     montelukast (SINGULAIR) 10 MG tablet Take 10 mg by mouth at bedtime.     Probiotic Product ([place]) CAPS Take 1 capsule by mouth daily.     No current facility-administered medications on file prior to visit.    There are no Patient Instructions on file for  this visit. No follow-ups on file.   Kris Hartmann, NP

## 2021-10-07 ENCOUNTER — Ambulatory Visit: Payer: Medicare Other | Admitting: Student in an Organized Health Care Education/Training Program

## 2021-11-18 ENCOUNTER — Encounter: Payer: Self-pay | Admitting: Student in an Organized Health Care Education/Training Program

## 2021-11-18 ENCOUNTER — Ambulatory Visit
Payer: Medicare Other | Attending: Student in an Organized Health Care Education/Training Program | Admitting: Student in an Organized Health Care Education/Training Program

## 2021-11-18 ENCOUNTER — Other Ambulatory Visit: Payer: Self-pay

## 2021-11-18 VITALS — BP 130/87 | HR 79 | Temp 97.2°F | Resp 18 | Ht 63.0 in | Wt 150.0 lb

## 2021-11-18 DIAGNOSIS — G894 Chronic pain syndrome: Secondary | ICD-10-CM | POA: Diagnosis present

## 2021-11-18 DIAGNOSIS — M47816 Spondylosis without myelopathy or radiculopathy, lumbar region: Secondary | ICD-10-CM | POA: Diagnosis present

## 2021-11-18 DIAGNOSIS — M5416 Radiculopathy, lumbar region: Secondary | ICD-10-CM | POA: Diagnosis present

## 2021-11-18 DIAGNOSIS — M48062 Spinal stenosis, lumbar region with neurogenic claudication: Secondary | ICD-10-CM | POA: Diagnosis present

## 2021-11-18 DIAGNOSIS — G8929 Other chronic pain: Secondary | ICD-10-CM

## 2021-11-18 NOTE — Progress Notes (Signed)
Safety precautions to be maintained throughout the outpatient stay will include: orient to surroundings, keep bed in low position, maintain call bell within reach at all times, provide assistance with transfer out of bed and ambulation.  

## 2021-11-18 NOTE — Patient Instructions (Signed)
Pain Management Discharge Instructions  General Discharge Instructions :  If you need to reach your doctor call: Monday-Friday 8:00 am - 4:00 pm at [phone] or toll free [phone].  After clinic hours [phone] to have operator reach doctor.  Bring all of your medication bottles to all your appointments in the pain clinic.  To cancel or reschedule your appointment with Pain Management please remember to call 24 hours in advance to avoid a fee.  Refer to the educational materials which you have been given on: General Risks, I had my Procedure. Discharge Instructions, Post Sedation.  Post Procedure Instructions:  The drugs you were given will stay in your system until tomorrow, so for the next 24 hours you should not drive, make any legal decisions or drink any alcoholic beverages.  You may eat anything you prefer, but it is better to start with liquids then soups and crackers, and gradually work up to solid foods.  Please notify your doctor immediately if you have any unusual bleeding, trouble breathing or pain that is not related to your normal pain.  Depending on the type of procedure that was done, some parts of your body may feel week and/or numb.  This usually clears up by tonight or the next day.  Walk with the use of an assistive device or accompanied by an adult for the 24 hours.  You may use ice on the affected area for the first 24 hours.  Put ice in a Ziploc bag and cover with a towel and place against area 15 minutes on 15 minutes off.  You may switch to heat after 24 hours.Epidural Steroid Injection Patient Information  Description: The epidural space surrounds the nerves as they exit the spinal cord.  In some patients, the nerves can be compressed and inflamed by a bulging disc or a tight spinal canal (spinal stenosis).  By injecting steroids into the epidural space, we can bring irritated nerves into direct contact with a potentially helpful medication.  These  steroids act directly on the irritated nerves and can reduce swelling and inflammation which often leads to decreased pain.  Epidural steroids may be injected anywhere along the spine and from the neck to the low back depending upon the location of your pain.   After numbing the skin with local anesthetic (like Novocaine), a small needle is passed into the epidural space slowly.  You may experience a sensation of pressure while this is being done.  The entire block usually last less than 10 minutes.  Conditions which may be treated by epidural steroids:  Low back and leg pain Neck and arm pain Spinal stenosis Post-laminectomy syndrome Herpes zoster (shingles) pain Pain from compression fractures  Preparation for the injection:  Do not eat any solid food or dairy products within 8 hours of your appointment.  You may drink clear liquids up to 3 hours before appointment.  Clear liquids include water, black coffee, juice or soda.  No milk or cream please. You may take your regular medication, including pain medications, with a sip of water before your appointment  Diabetics should hold regular insulin (if taken separately) and take 1/2 normal NPH dos the morning of the procedure.  Carry some sugar containing items with you to your appointment. A driver must accompany you and be prepared to drive you home after your procedure.  Bring all your current medications with your. An IV may be inserted and sedation may be given at the discretion of the physician.     A blood pressure cuff, EKG and other monitors will often be applied during the procedure.  Some patients may need to have extra oxygen administered for a short period. You will be asked to provide medical information, including your allergies, prior to the procedure.  We must know immediately if you are taking blood thinners (like Coumadin/Warfarin)  Or if you are allergic to IV iodine contrast (dye). We must know if you could possible be  pregnant.  Possible side-effects: Bleeding from needle site Infection (rare, may require surgery) Nerve injury (rare) Numbness & tingling (temporary) Difficulty urinating (rare, temporary) Spinal headache ( a headache worse with upright posture) Light -headedness (temporary) Pain at injection site (several days) Decreased blood pressure (temporary) Weakness in arm/leg (temporary) Pressure sensation in back/neck (temporary)  Call if you experience: Fever/chills associated with headache or increased back/neck pain. Headache worsened by an upright position. New onset weakness or numbness of an extremity below the injection site Hives or difficulty breathing (go to the emergency room) Inflammation or drainage at the infection site Severe back/neck pain Any new symptoms which are concerning to you  Please note:  Although the local anesthetic injected can often make your back or neck feel good for several hours after the injection, the pain will likely return.  It takes 3-7 days for steroids to work in the epidural space.  You may not notice any pain relief for at least that one week.  If effective, we will often do a series of three injections spaced 3-6 weeks apart to maximally decrease your pain.  After the initial series, we generally will wait several months before considering a repeat injection of the same type.  If you have any questions, please call [phone] [facility] 

## 2021-11-18 NOTE — Progress Notes (Signed)
Patient: Eileen Maldonado  Service Category: E/M  Provider: Gillis Santa, MD  DOB: [birth year]  DOS: 11/18/2021  Referring Provider: Maryland Pink, MD  MRN: [id number]  Setting: Ambulatory outpatient  PCP: Maryland Pink, MD  Type: New Patient  Specialty: Interventional Pain Management    Location: Office  Delivery: Face-to-face     Primary Reason(s) for Visit: Encounter for initial evaluation of one or more chronic problems (new to examiner) potentially causing chronic pain, and posing a threat to normal musculoskeletal function. (Level of risk: High) CC: Back Pain (low)  HPI  Eileen Maldonado is a 76 y.o. year old, female patient, who comes for the first time to our practice referred by Maryland Pink, MD for our initial evaluation of her chronic pain. She has Rheumatoid arthritis ([place]); Ileitis; S/P total knee arthroplasty; Chronic bilateral low back pain with bilateral sciatica; High risk medication use; Spinal stenosis, lumbar region, with neurogenic claudication; Uterine fibroid; Chronic radicular lumbar pain; and Lumbar spondylosis on their problem list. Today she comes in for evaluation of her Back Pain (low)  Pain Assessment: Location: Lower Back Radiating: posterior legs to ankles. Onset: More than a month ago Duration: Chronic pain Quality: Aching, Constant Severity: 7 /10 (subjective, self-reported pain score)  Effect on ADL:   Timing: Constant Modifying factors: Gabapentin, diclofenac, rest BP: 130/87  HR: 79  Onset and Duration: Gradual Cause of pain: Unknown Severity: Getting worse Timing: Morning, Afternoon, and Night Aggravating Factors: Bending, Climbing, Kneeling, Lifiting, Prolonged standing, Squatting, Stooping , and Walking Alleviating Factors: Medications Associated Problems: Dizziness, Numbness, Temperature changes, and Weakness Quality of Pain: Aching, Annoying, Burning, Constant, Cramping, Disabling, Distressing, Dull, Exhausting, Feeling of constriction, Getting  shorter, Nagging, Tingling, Tiring, and Toothache-like Previous Examinations or Tests: Bone scan, Epidurogram, MRI scan, and Neurological evaluation Previous Treatments: Epidural steroid injections  Patient is a pleasant 76 year old female with a chief complaint of low back pain with radiation into bilateral buttocks and proximal thighs as well as trouble with walking given weakness of legs.  This is secondary to lumbar spinal stenosis with neurogenic claudication.  Patient is currently on gabapentin at 100 mg twice a day and takes diclofenac 50 mg 3 times a day as needed prescribed by her primary care provider.  She also has rheumatoid arthritis for which she sees rheumatology as well as interstitial lung disease.  She has found benefit with epidural steroid injections in the past, these were done over 5 years ago.  We discussed repeating lumbar epidural steroid injection for lumbar spinal stenosis and neurogenic claudication.  Risk and benefits were reviewed and patient would like to proceed.  Meds   Current Outpatient Medications:    Abatacept (ORENCIA [place]), Inject 1 Dose into the skin once a week., Disp: , Rfl:    albuterol (PROVENTIL HFA;VENTOLIN HFA) 108 (90 Base) MCG/ACT inhaler, Inhale 2 puffs into the lungs every 6 (six) hours as needed for wheezing or shortness of breath., Disp: , Rfl:    Cholecalciferol (VITAMIN D3) 1000 units CAPS, Take 1,000 Units by mouth daily., Disp: , Rfl:    citalopram (CELEXA) 10 MG tablet, Take 1 tablet by mouth daily., Disp: , Rfl:    Dextran 70-Hypromellose 0.1-0.3 % SOLN, Place 1 drop into both eyes daily as needed for dry eyes., Disp: , Rfl:    diclofenac (VOLTAREN) 50 MG EC tablet, Take 50 mg by mouth 3 (three) times daily., Disp: , Rfl:    docusate sodium (COLACE) 100 MG capsule, Take 1 capsule (100 mg total)  by mouth 2 (two) times daily. To keep stools soft, Disp: 30 capsule, Rfl: 0   fluticasone (FLONASE) 50 MCG/ACT nasal spray, Place 1 spray into both  nostrils daily as needed for allergies or rhinitis. , Disp: , Rfl:    fluticasone furoate-vilanterol (BREO ELLIPTA) 100-25 MCG/ACT AEPB, Inhale into the lungs., Disp: , Rfl:    Fluticasone-Salmeterol (ADVAIR) 250-50 MCG/DOSE AEPB, Inhale 1 puff into the lungs every 12 (twelve) hours., Disp: , Rfl:    gabapentin (NEURONTIN) 100 MG capsule, Take 100 mg by mouth 2 (two) times daily., Disp: , Rfl:    hydrochlorothiazide (HYDRODIURIL) 25 MG tablet, Take 25 mg by mouth daily., Disp: , Rfl:    leflunomide (ARAVA) 10 MG tablet, Take 10 mg by mouth daily. , Disp: , Rfl:    loperamide (IMODIUM A-D) 2 MG tablet, Take 2 mg by mouth 2 (two) times daily., Disp: , Rfl:    montelukast (SINGULAIR) 10 MG tablet, Take 10 mg by mouth at bedtime., Disp: , Rfl:    Probiotic Product (Vandiver) CAPS, Take 1 capsule by mouth daily., Disp: , Rfl:   Imaging Review   MR LUMBAR SPINE WO CONTRAST  Narrative CLINICAL DATA:  Bilateral low back, buttock and leg pain.  EXAM: MRI LUMBAR SPINE WITHOUT CONTRAST  TECHNIQUE: Multiplanar, multisequence MR imaging of the lumbar spine was performed. No intravenous contrast was administered.  COMPARISON:  None.  FINDINGS: Segmentation:  5 lumbar type vertebral bodies.  Alignment:  Normal  Vertebrae:  No fracture or primary lesion.  Conus medullaris: Extends to the L1-2 level and appears normal.  Paraspinal and other soft tissues: Negative  Disc levels:  Minimal non-compressive disc bulges at T11-12 and T12-L1.  L1-2 and L2-3 are normal.  L3-4: Mild bulging of the disc. Mild facet and ligamentous hypertrophy. No compressive stenosis.  L4-5: Disc degeneration with broad-based herniation of slightly more prominent towards the right. Bilateral facet and ligamentous hypertrophy. Fluid in the facet joints. Severe spinal stenosis. This could worsen with standing or flexion.  L5-S1: Shallow broad-based disc herniation. Facet and ligamentous hypertrophy  with fluid in the joints. Stenosis of the lateral recesses that could worsen with standing or flexion. Neural compression could occur.  IMPRESSION: Severe multifactorial spinal stenosis at L4-5. This could worsen with standing or flexion.  Bilateral lateral recess stenosis at L5-S1. This could worsen with standing or flexion.  Non-compressive disc bulge and mild facet hypertrophy at L3-4.   Electronically Signed By: Nelson Chimes M.D. On: 05/04/2016 10:50  Complexity Note: Imaging results reviewed.                         ROS  Cardiovascular: No reported cardiovascular signs or symptoms such as High blood pressure, coronary artery disease, abnormal heart rate or rhythm, heart attack, blood thinner therapy or heart weakness and/or failure Pulmonary or Respiratory: Lung problems and Coughing up mucus (Bronchitis) Neurological: No reported neurological signs or symptoms such as seizures, abnormal skin sensations, urinary and/or fecal incontinence, being born with an abnormal open spine and/or a tethered spinal cord Psychological-Psychiatric: No reported psychological or psychiatric signs or symptoms such as difficulty sleeping, anxiety, depression, delusions or hallucinations (schizophrenial), mood swings (bipolar disorders) or suicidal ideations or attempts Gastrointestinal: No reported gastrointestinal signs or symptoms such as vomiting or evacuating blood, reflux, heartburn, alternating episodes of diarrhea and constipation, inflamed or scarred liver, or pancreas or irrregular and/or infrequent bowel movements Genitourinary: No reported renal or genitourinary signs  or symptoms such as difficulty voiding or producing urine, peeing blood, non-functioning kidney, kidney stones, difficulty emptying the bladder, difficulty controlling the flow of urine, or chronic kidney disease Hematological: No reported hematological signs or symptoms such as prolonged bleeding, low or poor functioning  platelets, bruising or bleeding easily, hereditary bleeding problems, low energy levels due to low hemoglobin or being anemic Endocrine: No reported endocrine signs or symptoms such as high or low blood sugar, rapid heart rate due to high thyroid levels, obesity or weight gain due to slow thyroid or thyroid disease Rheumatologic: Rheumatoid arthritis Musculoskeletal: Negative for myasthenia gravis, muscular dystrophy, multiple sclerosis or malignant hyperthermia Work History: Retired  Allergies  Ms. Sepulveda is allergic to amlodipine, lisinopril, celebrex [celecoxib], methotrexate derivatives, bupropion, doxycycline, etanercept, and latex.  Laboratory Chemistry Profile   Renal Lab Results  Component Value Date   BUN 19 12/19/2017   CREATININE 1.06 (H) 12/19/2017   GFRAA 59 (L) 12/19/2017   GFRNONAA 51 (L) 12/19/2017   PROTEINUR NEGATIVE 06/22/2016     Electrolytes Lab Results  Component Value Date   NA 138 12/22/2017   K 3.3 (L) 12/22/2017   CL 104 12/19/2017   CALCIUM 9.1 12/19/2017     Hepatic Lab Results  Component Value Date   AST 25 06/22/2016   ALT 21 06/22/2016   ALBUMIN 4.1 06/22/2016   ALKPHOS 76 06/22/2016   LIPASE 22 01/31/2016     ID Lab Results  Component Value Date   SARSCOV2NAA Not Detected 03/19/2019   STAPHAUREUS NEGATIVE 06/22/2016   MRSAPCR NEGATIVE 06/22/2016     Bone No results found for: "VD25OH", "VD125OH2TOT", "GO1157WI2", "MB5597CB6", "25OHVITD1", "25OHVITD2", "38GTXMIW8", "TESTOFREE", "TESTOSTERONE"   Endocrine Lab Results  Component Value Date   GLUCOSE 100 (H) 12/22/2017   GLUCOSEU NEGATIVE 06/22/2016   TSH 1.830 01/31/2016     Neuropathy No results found for: "VITAMINB12", "FOLATE", "HGBA1C", "HIV"   CNS No results found for: "COLORCSF", "APPEARCSF", "RBCCOUNTCSF", "WBCCSF", "POLYSCSF", "LYMPHSCSF", "EOSCSF", "PROTEINCSF", "GLUCCSF", "JCVIRUS", "CSFOLI", "IGGCSF", "LABACHR", "ACETBL"   Inflammation (CRP: Acute  ESR:  Chronic) Lab Results  Component Value Date   CRP <0.8 06/22/2016   ESRSEDRATE 11 06/22/2016   LATICACIDVEN 2.3 ([place]) 01/31/2016     Rheumatology No results found for: "RF", "ANA", "LABURIC", "URICUR", "LYMEIGGIGMAB", "LYMEABIGMQN", "HLAB27"   Coagulation Lab Results  Component Value Date   INR 1.11 06/22/2016   LABPROT 14.4 06/22/2016   APTT 29 06/22/2016   PLT 258 12/18/2017     Cardiovascular Lab Results  Component Value Date   HGB 14.6 12/22/2017   HCT 43.0 12/22/2017     Screening Lab Results  Component Value Date   SARSCOV2NAA Not Detected 03/19/2019   STAPHAUREUS NEGATIVE 06/22/2016   MRSAPCR NEGATIVE 06/22/2016     Cancer No results found for: "CEA", "CA125", "LABCA2"   Allergens No results found for: "ALMOND", "APPLE", "ASPARAGUS", "AVOCADO", "BANANA", "BARLEY", "BASIL", "BAYLEAF", "GREENBEAN", "LIMABEAN", "WHITEBEAN", "BEEFIGE", "REDBEET", "BLUEBERRY", "BROCCOLI", "CABBAGE", "MELON", "CARROT", "CASEIN", "CASHEWNUT", "CAULIFLOWER", "CELERY"     Note: Lab results reviewed.  PFSH  Drug: Ms. Kachmar  reports no history of drug use. Alcohol:  reports no history of alcohol use. Tobacco:  reports that she has never smoked. She has never used smokeless tobacco. Medical:  has a past medical history of Breast cancer ([place]), Collagen vascular disease ([place]), Hives, ILD (interstitial lung disease) ([place]), Pneumonia, PVD (peripheral vascular disease) ([place]), RA (rheumatoid arthritis) ([place]), Raynaud's phenomenon, and Right thyroid nodule. Family: family history includes Breast cancer in her mother;  Hypertension in her father and mother; Stroke in her father.  Past Surgical History:  Procedure Laterality Date   APPENDECTOMY     appy     breast biopsy     BREAST SURGERY Left    biopsy of milk duct   hernia repair     HERNIA REPAIR     Umbilical Hernia   HYSTEROSCOPY WITH D & C N/A 12/22/2017   Procedure: DILATATION AND CURETTAGE /HYSTEROSCOPY;  Surgeon: Benjaman Kindler,  MD;  Location: [facility];  Service: Gynecology;  Laterality: N/A;   KNEE ARTHROPLASTY Left 07/06/2016   Procedure: COMPUTER ASSISTED TOTAL KNEE ARTHROPLASTY;  Surgeon: Dereck Leep, MD;  Location: [facility];  Service: Orthopedics;  Laterality: Left;   Active Ambulatory Problems    Diagnosis Date Noted   Rheumatoid arthritis ([place]) 07/28/2014   Ileitis 01/31/2016   S/P total knee arthroplasty 07/06/2016   Chronic bilateral low back pain with bilateral sciatica 08/13/2019   High risk medication use 11/20/2019   Spinal stenosis, lumbar region, with neurogenic claudication 08/13/2019   Uterine fibroid 11/10/2017   Chronic radicular lumbar pain 11/18/2021   Lumbar spondylosis 11/18/2021   Resolved Ambulatory Problems    Diagnosis Date Noted   No Resolved Ambulatory Problems   Past Medical History:  Diagnosis Date   Breast cancer ([place])    Collagen vascular disease ([place])    Hives    ILD (interstitial lung disease) ([place])    Pneumonia    PVD (peripheral vascular disease) ([place])    RA (rheumatoid arthritis) ([place])    Raynaud's phenomenon    Right thyroid nodule    Constitutional Exam  General appearance: Well nourished, well developed, and well hydrated. In no apparent acute distress Vitals:   11/18/21 1303  BP: 130/87  Pulse: 79  Resp: 18  Temp: (!) 97.2 F (36.2 C)  TempSrc: Temporal  SpO2: 96%  Weight: 150 lb (68 kg)  Height: 5' 3"  (1.6 m)   BMI Assessment: Estimated body mass index is 26.57 kg/m as calculated from the following:   Height as of this encounter: 5' 3"  (1.6 m).   Weight as of this encounter: 150 lb (68 kg).  BMI interpretation table: BMI level Category Range association with higher incidence of chronic pain  <18 kg/m2 Underweight   18.5-24.9 kg/m2 Ideal body weight   25-29.9 kg/m2 Overweight Increased incidence by 20%  30-34.9 kg/m2 Obese (Class I) Increased incidence by 68%  35-39.9 kg/m2 Severe obesity (Class II) Increased incidence by 136%  >40 kg/m2  Extreme obesity (Class III) Increased incidence by 254%   Patient's current BMI Ideal Body weight  Body mass index is 26.57 kg/m. Ideal body weight: 52.4 kg (115 lb 8.3 oz) Adjusted ideal body weight: 58.7 kg (129 lb 5 oz)   BMI Readings from Last 4 Encounters:  11/18/21 26.57 kg/m  02/02/21 24.98 kg/m  10/05/20 25.15 kg/m  12/22/17 29.02 kg/m   Wt Readings from Last 4 Encounters:  11/18/21 150 lb (68 kg)  02/02/21 141 lb (64 kg)  10/05/20 142 lb (64.4 kg)  12/22/17 163 lb 12.8 oz (74.3 kg)    Psych/Mental status: Alert, oriented x 3 (person, place, & time)       Eyes: PERLA Respiratory: No evidence of acute respiratory distress  Lumbar Spine Area Exam  Skin & Axial Inspection: No masses, redness, or swelling Alignment: Symmetrical Functional ROM: Pain restricted ROM       Stability: No instability detected Muscle Tone/Strength: Functionally intact. No obvious neuro-muscular  anomalies detected. Sensory (Neurological): Neurogenic pain pattern Palpation: No palpable anomalies         Gait & Posture Assessment  Ambulation: Limited Gait: Antalgic gait (limping) Posture: Difficulty standing up straight, due to pain   Lower Extremity Exam    Side: Right lower extremity  Side: Left lower extremity  Stability: No instability observed          Stability: No instability observed          Skin & Extremity Inspection: Skin color, temperature, and hair growth are WNL. No peripheral edema or cyanosis. No masses, redness, swelling, asymmetry, or associated skin lesions. No contractures.  Skin & Extremity Inspection: Skin color, temperature, and hair growth are WNL. No peripheral edema or cyanosis. No masses, redness, swelling, asymmetry, or associated skin lesions. No contractures.  Functional ROM: Pain restricted ROM for hip and knee joints          Functional ROM: Pain restricted ROM for hip and knee joints          Muscle Tone/Strength: Functionally intact. No obvious  neuro-muscular anomalies detected.  Muscle Tone/Strength: Functionally intact. No obvious neuro-muscular anomalies detected.  Sensory (Neurological): Neurogenic pain pattern        Sensory (Neurological): Neurogenic pain pattern        DTR: Patellar: deferred today Achilles: deferred today Plantar: deferred today  DTR: Patellar: deferred today Achilles: deferred today Plantar: deferred today  Palpation: No palpable anomalies  Palpation: No palpable anomalies    Assessment  Primary Diagnosis & Pertinent Problem List: The primary encounter diagnosis was Spinal stenosis, lumbar region, with neurogenic claudication. Diagnoses of Chronic radicular lumbar pain, Lumbar facet arthropathy, Lumbar spondylosis, and Chronic pain syndrome were also pertinent to this visit.  Visit Diagnosis (New problems to examiner): 1. Spinal stenosis, lumbar region, with neurogenic claudication   2. Chronic radicular lumbar pain   3. Lumbar facet arthropathy   4. Lumbar spondylosis   5. Chronic pain syndrome    Plan of Care (Initial workup plan)   Orders Placed This Encounter  Procedures   Lumbar Epidural Injection    Standing Status:   Future    Standing Expiration Date:   02/18/2022    Scheduling Instructions:     Procedure: Interlaminar Lumbar Epidural Steroid injection (LESI)            Laterality: Midline     Sedation: PO Valium     Timeframe: ASAA    Order Specific Question:   Where will this procedure be performed?    Answer:   [facility]     Procedure Orders         Lumbar Epidural Injection      Provider-requested follow-up: Return in about 2 weeks (around 12/02/2021) for L4-5 ESI , in clinic (PO Valium).  I spent a total of 60 minutes reviewing chart data, face-to-face evaluation with the patient, counseling and coordination of care as detailed above.   Future Appointments  Date Time Provider [place]  12/08/2021 11:20 AM Gillis Santa, MD [facility] None    Note  by: Gillis Santa, MD Date: 11/18/2021; Time: 1:51 PM

## 2021-12-08 ENCOUNTER — Ambulatory Visit: Payer: Medicare Other | Admitting: Student in an Organized Health Care Education/Training Program

## 2021-12-13 ENCOUNTER — Ambulatory Visit
Payer: Medicare Other | Attending: Student in an Organized Health Care Education/Training Program | Admitting: Student in an Organized Health Care Education/Training Program

## 2021-12-13 ENCOUNTER — Encounter: Payer: Self-pay | Admitting: Student in an Organized Health Care Education/Training Program

## 2021-12-13 ENCOUNTER — Ambulatory Visit
Admission: RE | Admit: 2021-12-13 | Discharge: 2021-12-13 | Disposition: A | Discharge status: Home / Self Care | Payer: Medicare Other | Source: Ambulatory Visit | Attending: Student in an Organized Health Care Education/Training Program | Admitting: Student in an Organized Health Care Education/Training Program

## 2021-12-13 VITALS — BP 151/86 | HR 75 | Temp 97.3°F | Resp 17 | Ht 62.0 in | Wt 150.0 lb

## 2021-12-13 DIAGNOSIS — M48062 Spinal stenosis, lumbar region with neurogenic claudication: Secondary | ICD-10-CM | POA: Diagnosis present

## 2021-12-13 DIAGNOSIS — M5416 Radiculopathy, lumbar region: Secondary | ICD-10-CM | POA: Insufficient documentation

## 2021-12-13 DIAGNOSIS — G8929 Other chronic pain: Secondary | ICD-10-CM | POA: Diagnosis present

## 2021-12-13 DIAGNOSIS — G894 Chronic pain syndrome: Secondary | ICD-10-CM | POA: Diagnosis present

## 2021-12-13 MED ORDER — LIDOCAINE HCL 2 % IJ SOLN
20.0000 mL | Freq: Once | INTRAMUSCULAR | Status: AC
  Administered 2021-12-13: 400 mg

## 2021-12-13 MED ORDER — SODIUM CHLORIDE 0.9% FLUSH
2.0000 mL | Freq: Once | INTRAVENOUS | Status: AC
  Administered 2021-12-13: 2 mL

## 2021-12-13 MED ORDER — IOHEXOL 180 MG/ML  SOLN
10.0000 mL | Freq: Once | INTRAMUSCULAR | Status: AC
  Administered 2021-12-13: 10 mL via EPIDURAL

## 2021-12-13 MED ORDER — IOHEXOL 180 MG/ML  SOLN
INTRAMUSCULAR | Status: AC
  Filled 2021-12-13: qty 20

## 2021-12-13 MED ORDER — LIDOCAINE HCL 2 % IJ SOLN
INTRAMUSCULAR | Status: AC
  Filled 2021-12-13: qty 20

## 2021-12-13 MED ORDER — DIAZEPAM 5 MG PO TABS
5.0000 mg | ORAL_TABLET | ORAL | Status: AC
  Administered 2021-12-13: 5 mg via ORAL

## 2021-12-13 MED ORDER — DEXAMETHASONE SODIUM PHOSPHATE 10 MG/ML IJ SOLN
INTRAMUSCULAR | Status: AC
  Filled 2021-12-13: qty 1

## 2021-12-13 MED ORDER — ROPIVACAINE HCL 2 MG/ML IJ SOLN
2.0000 mL | Freq: Once | INTRAMUSCULAR | Status: AC
  Administered 2021-12-13: 2 mL via EPIDURAL

## 2021-12-13 MED ORDER — DIAZEPAM 5 MG PO TABS
ORAL_TABLET | ORAL | Status: AC
  Filled 2021-12-13: qty 1

## 2021-12-13 MED ORDER — SODIUM CHLORIDE (PF) 0.9 % IJ SOLN
INTRAMUSCULAR | Status: AC
  Filled 2021-12-13: qty 10

## 2021-12-13 MED ORDER — DEXAMETHASONE SODIUM PHOSPHATE 10 MG/ML IJ SOLN
10.0000 mg | Freq: Once | INTRAMUSCULAR | Status: AC
  Administered 2021-12-13: 10 mg

## 2021-12-13 MED ORDER — ROPIVACAINE HCL 2 MG/ML IJ SOLN
INTRAMUSCULAR | Status: AC
  Filled 2021-12-13: qty 20

## 2021-12-13 NOTE — Progress Notes (Signed)
PROVIDER NOTE: Interpretation of information contained herein should be left to medically-trained personnel. Specific patient instructions are provided elsewhere under "Patient Instructions" section of medical record. This document was created in part using STT-dictation technology, any transcriptional errors that may result from this process are unintentional.  Patient: Eileen Maldonado Type: Established DOB: [birth year] MRN: [id number] PCP: Maryland Pink, MD  Service: Procedure DOS: 12/13/2021 Setting: Ambulatory Location: Ambulatory outpatient facility Delivery: Face-to-face Provider: Gillis Santa, MD Specialty: Interventional Pain Management Specialty designation: 09 Location: Outpatient facility Ref. Prov.: Maryland Pink, MD    Primary Reason for Visit: Interventional Pain Management Treatment. CC: Back Pain (lower)   Procedure:           Type: Lumbar epidural steroid injection (LESI) (interlaminar) #1    Laterality: Midline   Level:  L4-5 Level.  Imaging: Fluoroscopic guidance         Anesthesia: Local anesthesia (1-2% Lidocaine) Anxiolysis: Oral Valium 5 mg DOS: 12/13/2021  Performed by: Gillis Santa, MD  Purpose: Diagnostic/Therapeutic Indications: Lumbar radicular pain of intraspinal etiology of more than 4 weeks that has failed to respond to conservative therapy and is severe enough to impact quality of life or function. 1. Spinal stenosis, lumbar region, with neurogenic claudication   2. Chronic radicular lumbar pain   3. Chronic pain syndrome    NAS-11 Pain score:   Pre-procedure: 8 /10   Post-procedure: 8 /10      Position / Prep / Materials:  Position: Prone w/ head of the table raised (slight reverse trendelenburg) to facilitate breathing.  Prep solution: DuraPrep (Iodine Povacrylex [0.7% available iodine] and Isopropyl Alcohol, 74% w/w) Prep Area: Entire Posterior Lumbar Region from lower scapular tip down to mid buttocks area and from flank to  flank. Materials:  Tray: Epidural tray Needle(s):  Type: Epidural needle (Tuohy) Gauge (G):  22 Length: Regular (3.5-in) Qty: 1  Pre-op H&P Assessment:  Ms. Hajjar is a 76 y.o. (year old), female patient, seen today for interventional treatment. She  has a past surgical history that includes appy; breast biopsy; hernia repair; Hernia repair; Knee Arthroplasty (Left, 07/06/2016); Breast surgery (Left); Appendectomy; and Hysteroscopy with D & C (N/A, 12/22/2017). Ms. Kusch has a current medication list which includes the following prescription(s): abatacept, albuterol, vitamin d3, citalopram, dextran 70-hypromellose, diclofenac, docusate sodium, fluticasone, fluticasone furoate-vilanterol, fluticasone-salmeterol, gabapentin, hydrochlorothiazide, leflunomide, loperamide, montelukast, and phillips colon health. Her primarily concern today is the Back Pain (lower)  Initial Vital Signs:  Pulse/HCG Rate: 79  Temp:  (!) 97.3 F (36.3 C) Resp: 17 BP:  (!) 151/102 SpO2: 99 %  BMI: Estimated body mass index is 27.44 kg/m as calculated from the following:   Height as of this encounter: '5\' 2"'$  (1.575 m).   Weight as of this encounter: 150 lb (68 kg).  Risk Assessment: Allergies: Reviewed. She is allergic to amlodipine, lisinopril, celebrex [celecoxib], methotrexate derivatives, bupropion, doxycycline, etanercept, and latex.  Allergy Precautions: None required Coagulopathies: Reviewed. None identified.  Blood-thinner therapy: None at this time Active Infection(s): Reviewed. None identified. Ms. Sonoda is afebrile  Site Confirmation: Ms. Ocasio was asked to confirm the procedure and laterality before marking the site Procedure checklist: Completed Consent: Before the procedure and under the influence of no sedative(s), amnesic(s), or anxiolytics, the patient was informed of the treatment options, risks and possible complications. To fulfill our ethical and legal obligations, as recommended by the  American Medical Association's Code of Ethics, I have informed the patient of my clinical impression; the nature and purpose  of the treatment or procedure; the risks, benefits, and possible complications of the intervention; the alternatives, including doing nothing; the risk(s) and benefit(s) of the alternative treatment(s) or procedure(s); and the risk(s) and benefit(s) of doing nothing. The patient was provided information about the general risks and possible complications associated with the procedure. These may include, but are not limited to: failure to achieve desired goals, infection, bleeding, organ or nerve damage, allergic reactions, paralysis, and death. In addition, the patient was informed of those risks and complications associated to Spine-related procedures, such as failure to decrease pain; infection (i.e.: Meningitis, epidural or intraspinal abscess); bleeding (i.e.: epidural hematoma, subarachnoid hemorrhage, or any other type of intraspinal or peri-dural bleeding); organ or nerve damage (i.e.: Any type of peripheral nerve, nerve root, or spinal cord injury) with subsequent damage to sensory, motor, and/or autonomic systems, resulting in permanent pain, numbness, and/or weakness of one or several areas of the body; allergic reactions; (i.e.: anaphylactic reaction); and/or death. Furthermore, the patient was informed of those risks and complications associated with the medications. These include, but are not limited to: allergic reactions (i.e.: anaphylactic or anaphylactoid reaction(s)); adrenal axis suppression; blood sugar elevation that in diabetics may result in ketoacidosis or comma; water retention that in patients with history of congestive heart failure may result in shortness of breath, pulmonary edema, and decompensation with resultant heart failure; weight gain; swelling or edema; medication-induced neural toxicity; particulate matter embolism and blood vessel occlusion with  resultant organ, and/or nervous system infarction; and/or aseptic necrosis of one or more joints. Finally, the patient was informed that Medicine is not an exact science; therefore, there is also the possibility of unforeseen or unpredictable risks and/or possible complications that may result in a catastrophic outcome. The patient indicated having understood very clearly. We have given the patient no guarantees and we have made no promises. Enough time was given to the patient to ask questions, all of which were answered to the patient's satisfaction. Ms. Brame has indicated that she wanted to continue with the procedure. Attestation: I, the ordering provider, attest that I have discussed with the patient the benefits, risks, side-effects, alternatives, likelihood of achieving goals, and potential problems during recovery for the procedure that I have provided informed consent. Date  Time: 12/13/2021  9:08 AM  Pre-Procedure Preparation:  Monitoring: As per clinic protocol. Respiration, ETCO2, SpO2, BP, heart rate and rhythm monitor placed and checked for adequate function Safety Precautions: Patient was assessed for positional comfort and pressure points before starting the procedure. Time-out: I initiated and conducted the "Time-out" before starting the procedure, as per protocol. The patient was asked to participate by confirming the accuracy of the "Time Out" information. Verification of the correct person, site, and procedure were performed and confirmed by me, the nursing staff, and the patient. "Time-out" conducted as per Joint Commission's Universal Protocol (UP.01.01.01). Time: 0943  Description/Narrative of Procedure:          Target: Epidural space via interlaminar opening, initially targeting the lower laminar border of the superior vertebral body. Region: Lumbar Approach: Percutaneous paravertebral  Rationale (medical necessity): procedure needed and proper for the diagnosis and/or  treatment of the patient's medical symptoms and needs. Procedural Technique Safety Precautions: Aspiration looking for blood return was conducted prior to all injections. At no point did we inject any substances, as a needle was being advanced. No attempts were made at seeking any paresthesias. Safe injection practices and needle disposal techniques used. Medications properly checked for expiration dates. SDV (  single dose vial) medications used. Description of the Procedure: Protocol guidelines were followed. The procedure needle was introduced through the skin, ipsilateral to the reported pain, and advanced to the target area. Bone was contacted and the needle walked caudad, until the lamina was cleared. The epidural space was identified using "loss-of-resistance technique" with 2-3 ml of PF-NaCl (0.9% NSS), in a 5cc LOR glass syringe.  6 cc solution made of 3 cc of preservative-free saline, 2 cc of 0.2% ropivacaine, 1 cc of Decadron 10 mg/cc.   Vitals:   12/13/21 0933 12/13/21 0938 12/13/21 0943 12/13/21 0950  BP: (!) 165/82 (!) 176/96 (!) 150/98 (!) 151/86  Pulse: 83 69 75 75  Resp: '20 16 16 17  '$ Temp:      TempSrc:      SpO2: 99% 98% 97% 98%  Weight:      Height:        Start Time: 0943 hrs. End Time: 0946 hrs.  Imaging Guidance (Spinal):          Type of Imaging Technique: Fluoroscopy Guidance (Spinal) Indication(s): Assistance in needle guidance and placement for procedures requiring needle placement in or near specific anatomical locations not easily accessible without such assistance. Exposure Time: Please see nurses notes. Contrast: Before injecting any contrast, we confirmed that the patient did not have an allergy to iodine, shellfish, or radiological contrast. Once satisfactory needle placement was completed at the desired level, radiological contrast was injected. Contrast injected under live fluoroscopy. No contrast complications. See chart for type and volume of contrast  used. Fluoroscopic Guidance: I was personally present during the use of fluoroscopy. "Tunnel Vision Technique" used to obtain the best possible view of the target area. Parallax error corrected before commencing the procedure. "Direction-depth-direction" technique used to introduce the needle under continuous pulsed fluoroscopy. Once target was reached, antero-posterior, oblique, and lateral fluoroscopic projection used confirm needle placement in all planes. Images permanently stored in EMR. Interpretation: I personally interpreted the imaging intraoperatively. Adequate needle placement confirmed in multiple planes. Appropriate spread of contrast into desired area was observed. No evidence of afferent or efferent intravascular uptake. No intrathecal or subarachnoid spread observed. Permanent images saved into the patient's record.   Post-operative Assessment:  Post-procedure Vital Signs:  Pulse/HCG Rate: 75  Temp:  (!) 97.3 F (36.3 C) Resp: 17 BP:  (!) 151/86 SpO2: 98 %  EBL: None  Complications: No immediate post-treatment complications observed by team, or reported by patient.  Note: The patient tolerated the entire procedure well. A repeat set of vitals were taken after the procedure and the patient was kept under observation following institutional policy, for this type of procedure. Post-procedural neurological assessment was performed, showing return to baseline, prior to discharge. The patient was provided with post-procedure discharge instructions, including a section on how to identify potential problems. Should any problems arise concerning this procedure, the patient was given instructions to immediately contact us, at any time, without hesitation. In any case, we plan to contact the patient by telephone for a follow-up status report regarding this interventional procedure.  Comments:  No additional relevant information.  5 out of 5 strength bilateral lower extremity: Plantar  flexion, dorsiflexion, knee flexion, knee extension.   Plan of Care  Orders:  Orders Placed This Encounter  Procedures   DG PA[facility] C-ARM 1-60 MIN NO REPORT    Intraoperative interpretation by procedural physician at [place].    Standing Status:   Standing    Number of Occurrences:  1    Order Specific Question:   Reason for exam:    Answer:   Assistance in needle guidance and placement for procedures requiring needle placement in or near specific anatomical locations not easily accessible without such assistance.    Medications ordered for procedure: Meds ordered this encounter  Medications   iohexol (OMNIPAQUE) 180 MG/ML injection 10 mL    Must be Myelogram-compatible. If not available, you may substitute with a water-soluble, non-ionic, hypoallergenic, myelogram-compatible radiological contrast medium.   lidocaine (XYLOCAINE) 2 % (with pres) injection 400 mg   diazepam (VALIUM) tablet 5 mg    Make sure Flumazenil is available in the pyxis when using this medication. If oversedation occurs, administer 0.2 mg IV over 15 sec. If after 45 sec no response, administer 0.2 mg again over 1 min; may repeat at 1 min intervals; not to exceed 4 doses (1 mg)   ropivacaine (PF) 2 mg/mL (0.2%) (NAROPIN) injection 2 mL   sodium chloride flush (NS) 0.9 % injection 2 mL   dexamethasone (DECADRON) injection 10 mg   Medications administered: We administered iohexol, lidocaine, diazepam, ropivacaine (PF) 2 mg/mL (0.2%), sodium chloride flush, and dexamethasone.  See the medical record for exact dosing, route, and time of administration.  Follow-up plan:   Return in about 4 weeks (around 01/10/2022) for Post Procedure Evaluation, in person.      L4-L5 ESI 12/13/2021   Recent Visits Date Type Provider Dept  11/18/21 Office Visit Gillis Santa, MD [facility]  Showing recent visits within past 90 days and meeting all other requirements Today's Visits Date Type Provider  Dept  12/13/21 Procedure visit Gillis Santa, MD [facility]  Showing today's visits and meeting all other requirements Future Appointments Date Type Provider Dept  01/12/22 Appointment Gillis Santa, MD [facility]  Showing future appointments within next 90 days and meeting all other requirements  Disposition: Discharge home  Discharge (Date  Time): 12/13/2021; 0951 hrs.   Primary Care Physician: Maryland Pink, MD Location: [facility] Note by: Gillis Santa, MD Date: 12/13/2021; Time: 10:07 AM  Disclaimer:  Medicine is not an exact science. The only guarantee in medicine is that nothing is guaranteed. It is important to note that the decision to proceed with this intervention was based on the information collected from the patient. The Data and conclusions were drawn from the patient's questionnaire, the interview, and the physical examination. Because the information was provided in large part by the patient, it cannot be guaranteed that it has not been purposely or unconsciously manipulated. Every effort has been made to obtain as much relevant data as possible for this evaluation. It is important to note that the conclusions that lead to this procedure are derived in large part from the available data. Always take into account that the treatment will also be dependent on availability of resources and existing treatment guidelines, considered by other Pain Management Practitioners as being common knowledge and practice, at the time of the intervention. For Medico-Legal purposes, it is also important to point out that variation in procedural techniques and pharmacological choices are the acceptable norm. The indications, contraindications, technique, and results of the above procedure should only be interpreted and judged by a Board-Certified Interventional Pain Specialist with extensive familiarity and expertise in the same exact procedure and  technique.

## 2021-12-13 NOTE — Patient Instructions (Signed)

## 2021-12-13 NOTE — Progress Notes (Signed)
Safety precautions to be maintained throughout the outpatient stay will include: orient to surroundings, keep bed in low position, maintain call bell within reach at all times, provide assistance with transfer out of bed and ambulation.  

## 2021-12-14 ENCOUNTER — Telehealth: Payer: Self-pay

## 2021-12-14 NOTE — Telephone Encounter (Signed)
Post procedure phone call.  Patient states she is doing well.  

## 2022-01-12 ENCOUNTER — Ambulatory Visit
Payer: Medicare Other | Attending: Student in an Organized Health Care Education/Training Program | Admitting: Student in an Organized Health Care Education/Training Program

## 2022-01-12 ENCOUNTER — Encounter: Payer: Self-pay | Admitting: Student in an Organized Health Care Education/Training Program

## 2022-01-12 VITALS — BP 153/98 | HR 78 | Temp 97.4°F | Resp 16 | Ht 63.0 in | Wt 150.0 lb

## 2022-01-12 DIAGNOSIS — M5416 Radiculopathy, lumbar region: Secondary | ICD-10-CM | POA: Diagnosis not present

## 2022-01-12 DIAGNOSIS — M48062 Spinal stenosis, lumbar region with neurogenic claudication: Secondary | ICD-10-CM | POA: Diagnosis not present

## 2022-01-12 DIAGNOSIS — G8929 Other chronic pain: Secondary | ICD-10-CM | POA: Diagnosis not present

## 2022-01-12 DIAGNOSIS — G894 Chronic pain syndrome: Secondary | ICD-10-CM | POA: Diagnosis not present

## 2022-01-12 MED ORDER — PREGABALIN 25 MG PO CAPS: ORAL_CAPSULE | ORAL | 0 refills | Status: DC

## 2022-01-12 NOTE — Progress Notes (Signed)
Safety precautions to be maintained throughout the outpatient stay will include: orient to surroundings, keep bed in low position, maintain call bell within reach at all times, provide assistance with transfer out of bed and ambulation.  

## 2022-01-12 NOTE — Progress Notes (Signed)
PROVIDER NOTE: Information contained herein reflects review and annotations entered in association with encounter. Interpretation of such information and data should be left to medically-trained personnel. Information provided to patient can be located elsewhere in the medical record under "Patient Instructions". Document created using STT-dictation technology, any transcriptional errors that may result from process are unintentional.    Patient: Eileen Maldonado  Service Category: E/M  Provider: Gillis Santa, MD  DOB: 1946/03/08  DOS: 01/12/2022  Referring Provider: Maryland Pink, MD  MRN: [id number]  Specialty: Interventional Pain Management  PCP: Maryland Pink, MD  Type: Established Patient  Setting: Ambulatory outpatient    Location: Office  Delivery: Face-to-face     HPI  Ms. Eileen Maldonado, a 75 y.o. 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 Eileen Maldonado's primary complain today is Back Pain (Lumbar bilateral ) and Leg Pain (Right improved, left side continues to have pain. ) Last encounter: My last encounter with her was on 12/13/2021. Pertinent problems: Eileen Maldonado has Rheumatoid arthritis ([place]); S/P total knee arthroplasty; Chronic bilateral low back pain with bilateral sciatica; Spinal stenosis, lumbar region, with neurogenic claudication; Chronic radicular lumbar pain; and Lumbar spondylosis on their pertinent problem list. Pain Assessment: Severity of Chronic pain is reported as a 5 /10. Location: Back Lower, Left, Right/denies. Onset: More than a month ago. Quality: Discomfort, Constant, Aching, Shooting. Timing: Constant. Modifying factor(s): gabapentin and diclofenac, procedrue. Vitals:  height is _0  (1.6 m) and weight is 150 lb (68 kg). Her temporal temperature is 97.4 F (36.3 C) (abnormal). Her blood pressure is 153/98 (abnormal) and her pulse is 78. Her respiration is 16 and oxygen saturation is 96%.    Reason for encounter: both, medication management and post-procedure evaluation and assessment.    Post-procedure evaluation   Type: Lumbar epidural steroid injection (LESI) (interlaminar) #1    Laterality: Midline   Level:  L4-5 Level.  Imaging: Fluoroscopic guidance         Anesthesia: Local anesthesia (1-2% Lidocaine) Anxiolysis: Oral Valium 5 mg DOS: 12/13/2021  Performed by: Gillis Santa, MD  Purpose: Diagnostic/Therapeutic Indications: Lumbar radicular pain of intraspinal etiology of more than 4 weeks that has failed to respond to conservative therapy and is severe enough to impact quality of life or function. 1. Spinal stenosis, lumbar region, with neurogenic claudication   2. Chronic radicular lumbar pain   3. Chronic pain syndrome    NAS-11 Pain score:   Pre-procedure: 8 /10   Post-procedure: 8 /10      Effectiveness:  Initial hour after procedure: 0 %  Subsequent 4-6 hours post-procedure: 0 %  Analgesia past initial 6 hours: 50 % (pain from the knee down subsided.  less intense in the back of thigh and buttocks. was able to move about in bed easier than pre procedure.)  Ongoing improvement:  Analgesic:  50-60% Function: Eileen Maldonado reports improvement in function ROM: Eileen Maldonado reports improvement in ROM    ROS  Constitutional: Denies any fever or chills Gastrointestinal: No reported hemesis, hematochezia, vomiting, or acute GI distress Musculoskeletal:  Low back, bilateral leg pain, lower extremity weakness, improved after epidural steroid injection.  Improved walking distance as well. Neurological: No reported episodes of acute onset apraxia, aphasia, dysarthria, agnosia, amnesia, paralysis, loss of coordination, or loss of consciousness  Medication Review  Abatacept, Butenafine HCl, Dextran 70-Hypromellose, Vitamin D3, albuterol, citalopram, diclofenac, docusate sodium, fluticasone, fluticasone furoate-vilanterol, hydrochlorothiazide, leflunomide,  loperamide, and pregabalin  History Review  Allergy: Eileen Maldonado is allergic to amlodipine, lisinopril, celebrex [celecoxib], methotrexate derivatives, bupropion, doxycycline, etanercept, and latex. Drug: Eileen Maldonado  reports no history of drug use. Alcohol:  reports no history of alcohol use. Tobacco:  reports that she has never smoked. She has never used smokeless tobacco. Social: Eileen Maldonado  reports that she has never smoked. She has never used smokeless tobacco. She reports that she does not drink alcohol and does not use drugs. Medical:  has a past medical history of Breast cancer ([place]), Collagen vascular disease ([place]), Hives, ILD (interstitial lung disease) ([place]), Pneumonia, PVD (peripheral vascular disease) ([place]), RA (rheumatoid arthritis) ([place]), Raynaud's phenomenon, and Right thyroid nodule. Surgical: Eileen Maldonado  has a past surgical history that includes appy; breast biopsy; hernia repair; Hernia repair; Knee Arthroplasty (Left, 07/06/2016); Breast surgery (Left); Appendectomy; and Hysteroscopy with D & C (N/A, 12/22/2017). Family: family history includes Breast cancer in her mother; Hypertension in her father and mother; Stroke in her father.  Laboratory Chemistry Profile   Renal Lab Results  Component Value Date   BUN 19 12/19/2017   CREATININE 1.06 (H) 12/19/2017   GFRAA 59 (L) 12/19/2017   GFRNONAA 51 (L) 12/19/2017    Hepatic Lab Results  Component Value Date   AST 25 06/22/2016   ALT 21 06/22/2016   ALBUMIN 4.1 06/22/2016   ALKPHOS 76 06/22/2016   LIPASE 22 01/31/2016    Electrolytes Lab Results  Component Value Date   NA 138 12/22/2017   K 3.3 (L) 12/22/2017   CL 104 12/19/2017   CALCIUM 9.1 12/19/2017    Bone No results found for: "VD25OH", "VD125OH2TOT", "FO2774JO8", "NO6767MC9", "25OHVITD1", "25OHVITD2", "47SJGGEZ6", "TESTOFREE", "TESTOSTERONE"  Inflammation (CRP: Acute Phase) (ESR: Chronic Phase) Lab Results  Component Value Date   CRP <0.8 06/22/2016    ESRSEDRATE 11 06/22/2016   LATICACIDVEN 2.3 ([place]) 01/31/2016         Note: Above Lab results reviewed.   Physical Exam  General appearance: Well nourished, well developed, and well hydrated. In no apparent acute distress Mental status: Alert, oriented x 3 (person, place, & time)       Respiratory: No evidence of acute respiratory distress Eyes: PERLA Vitals: BP (!) 153/98 (BP Location: Left Arm, Patient Position: Sitting, Cuff Size: Normal)   Pulse 78   Temp (!) 97.4 F (36.3 C) (Temporal)   Resp 16   Ht _0  (1.6 m)   Wt 150 lb (68 kg)   SpO2 96%   BMI 26.57 kg/m  BMI: Estimated body mass index is 26.57 kg/m as calculated from the following:   Height as of this encounter: _1  (1.6 m).   Weight as of this encounter: 150 lb (68 kg). Ideal: Ideal body weight: 52.4 kg (115 lb 8.3 oz) Adjusted ideal body weight: 58.7 kg (129 lb 5 oz)    Lumbar Spine Area Exam  Skin & Axial Inspection: No masses, redness, or swelling Alignment: Symmetrical Functional ROM: Pain restricted ROM       Stability: No instability detected Muscle Tone/Strength: Functionally intact. No obvious neuro-muscular anomalies detected. Sensory (Neurological): Neurogenic pain pattern improved after lumbar epidural steroid injection Palpation: No palpable anomalies           Gait & Posture Assessment  Ambulation: Limited Gait: Antalgic gait (limping) Posture: Difficulty standing up straight, due to pain   Lower Extremity Exam      Side: Right lower extremity   Side: Left lower extremity  Stability: No instability observed  Stability: No instability observed          Skin & Extremity Inspection: Skin color, temperature, and hair growth are WNL. No peripheral edema or cyanosis. No masses, redness, swelling, asymmetry, or associated skin lesions. No contractures.   Skin & Extremity Inspection: Skin color, temperature, and hair growth are WNL. No peripheral edema or cyanosis. No masses, redness,  swelling, asymmetry, or associated skin lesions. No contractures.  Functional ROM: Pain restricted ROM for hip and knee joints           Functional ROM: Pain restricted ROM for hip and knee joints          Muscle Tone/Strength: Functionally intact. No obvious neuro-muscular anomalies detected.   Muscle Tone/Strength: Functionally intact. No obvious neuro-muscular anomalies detected.  Sensory (Neurological): Neurogenic pain, improved after LESI        Sensory (Neurological): Neurogenic pain pattern, improved after LESI        DTR: Patellar: deferred today Achilles: deferred today Plantar: deferred today   DTR: Patellar: deferred today Achilles: deferred today Plantar: deferred today  Palpation: No palpable anomalies   Palpation: No palpable anomalies       Assessment   Diagnosis Status  1. Spinal stenosis, lumbar region, with neurogenic claudication   2. Chronic radicular lumbar pain   3. Chronic pain syndrome    Controlled Controlled Controlled   Updated Problems: Problem  Chronic Radicular Lumbar Pain  Lumbar Spondylosis  Chronic Bilateral Low Back Pain With Bilateral Sciatica  Spinal Stenosis, Lumbar Region, With Neurogenic Claudication  S/P Total Knee Arthroplasty  Rheumatoid Arthritis ([facility])     Plan of Care    Eileen Maldonado has a current medication list which includes the following long-term medication(s): abatacept, albuterol, citalopram, fluticasone, hydrochlorothiazide, leflunomide, and pregabalin.  Pharmacotherapy (Medications Ordered):  Patient states that she would like to trial Lyrica instead of gabapentin.  Prescription for Lyrica as below. Meds ordered this encounter  Medications   pregabalin (LYRICA) 25 MG capsule    Sig: Take 1 capsule (25 mg total) by mouth at bedtime for 21 days, THEN 2 capsules (50 mg total) at bedtime.    Dispense:  103 capsule    Refill:  0   Good results with previous lumbar epidural steroid injection, consider  repeating in future.  Overall pain is 50% better and she has improved walking ability as well.   Orders:  No orders of the defined types were placed in this encounter.  Follow-up plan:   Return in about 2 months (around 03/15/2022) for Medication Management, in person.     L4-L5 ESI 12/13/2021    Recent Visits Date Type Provider Dept  12/13/21 Procedure visit Gillis Santa, MD [facility]  11/18/21 Office Visit Gillis Santa, MD [facility]  Showing recent visits within past 90 days and meeting all other requirements Today's Visits Date Type Provider Dept  01/12/22 Office Visit Gillis Santa, MD [facility]  Showing today's visits and meeting all other requirements Future Appointments Date Type Provider Dept  03/17/22 Appointment Gillis Santa, MD [facility]  Showing future appointments within next 90 days and meeting all other requirements  I discussed the assessment and treatment plan with the patient. The patient was provided an opportunity to ask questions and all were answered. The patient agreed with the plan and demonstrated an understanding of the instructions.  Patient advised to call back or seek an in-person evaluation if the symptoms or condition worsens.  Duration of  encounter: 61mnutes.  Total time on encounter, as per AMA guidelines included both the face-to-face and non-face-to-face time personally spent by the physician and/or other qualified health care professional(s) on the day of the encounter (includes time in activities that require the physician or other qualified health care professional and does not include time in activities normally performed by clinical staff). Physician's time may include the following activities when performed: preparing to see the patient (eg, review of tests, pre-charting review of records) obtaining and/or reviewing separately obtained history performing a medically appropriate examination  and/or evaluation counseling and educating the patient/family/caregiver ordering medications, tests, or procedures referring and communicating with other health care professionals (when not separately reported) documenting clinical information in the electronic or other health record independently interpreting results (not separately reported) and communicating results to the patient/ family/caregiver care coordination (not separately reported)  Note by: BGillis Santa MD Date: 01/12/2022; Time: 3:24 PM

## 2022-01-24 ENCOUNTER — Encounter (INDEPENDENT_AMBULATORY_CARE_PROVIDER_SITE_OTHER): Payer: Self-pay

## 2022-03-17 ENCOUNTER — Ambulatory Visit
Payer: Medicare Other | Attending: Student in an Organized Health Care Education/Training Program | Admitting: Student in an Organized Health Care Education/Training Program

## 2022-03-17 ENCOUNTER — Encounter: Payer: Self-pay | Admitting: Student in an Organized Health Care Education/Training Program

## 2022-03-17 VITALS — BP 139/87 | HR 75 | Temp 97.3°F | Resp 16 | Ht 63.0 in | Wt 154.0 lb

## 2022-03-17 DIAGNOSIS — G894 Chronic pain syndrome: Secondary | ICD-10-CM

## 2022-03-17 DIAGNOSIS — M5416 Radiculopathy, lumbar region: Secondary | ICD-10-CM | POA: Diagnosis not present

## 2022-03-17 DIAGNOSIS — M48062 Spinal stenosis, lumbar region with neurogenic claudication: Secondary | ICD-10-CM | POA: Diagnosis not present

## 2022-03-17 DIAGNOSIS — G8929 Other chronic pain: Secondary | ICD-10-CM

## 2022-03-17 MED ORDER — PREGABALIN 25 MG PO CAPS: 50.0000 mg | ORAL_CAPSULE | Freq: Every day | ORAL | 1 refills | Status: AC

## 2022-03-17 MED ORDER — PREGABALIN 25 MG PO CAPS: 50.0000 mg | ORAL_CAPSULE | Freq: Every day | ORAL | 5 refills | Status: DC

## 2022-03-17 NOTE — Progress Notes (Signed)
Safety precautions to be maintained throughout the outpatient stay will include: orient to surroundings, keep bed in low position, maintain call bell within reach at all times, provide assistance with transfer out of bed and ambulation.  

## 2022-03-17 NOTE — Progress Notes (Signed)
PROVIDER NOTE: Information contained herein reflects review and annotations entered in association with encounter. Interpretation of such information and data should be left to medically-trained personnel. Information provided to patient can be located elsewhere in the medical record under "Patient Instructions". Document created using STT-dictation technology, any transcriptional errors that may result from process are unintentional.    Patient: Eileen Maldonado  Service Category: E/M  Provider: Gillis Santa, MD  DOB: 06-28-1945  DOS: 03/17/2022  Referring Provider: Maryland Pink, MD  MRN: [id number]  Specialty: Interventional Pain Management  PCP: Maryland Pink, MD  Type: Established Patient  Setting: Ambulatory outpatient    Location: Office  Delivery: Face-to-face     HPI  Eileen Maldonado, a 76 y.o. 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 Eileen Maldonado's primary complain today is Pain (Buttock bilat) Last encounter: My last encounter with her was on 01/12/2022. Pertinent problems: Eileen Maldonado has Rheumatoid arthritis ([place]); S/P total knee arthroplasty; Chronic bilateral low back pain with bilateral sciatica; Spinal stenosis, lumbar region, with neurogenic claudication; Chronic radicular lumbar pain; and Lumbar spondylosis on their pertinent problem list. Pain Assessment: Severity of Chronic pain is reported as a 4 /10. Location: Buttocks (LEFT side worse) Right, Left/to knees bilat. Onset: More than a month ago. Quality: Aching. Timing: Constant. Modifying factor(s): Lyrica. Vitals:  height is _0  (1.6 m) and weight is 154 lb (69.9 kg). Her temporal temperature is 97.3 F (36.3 C) (abnormal). Her blood pressure is 139/87 and her pulse is 75. Her respiration is 16 and oxygen saturation is 100%.   Reason for encounter: medication management.   Is finding benefit with Lyrica at 50 mg nightly although she states  that she has gained 5 pounds.  She feels that Lyrica has resulted in increased appetite.  She would like to remain at current dose because she does find it helpful. She continues to endorse benefit in regards to her bilateral leg pain after her previous lumbar epidural steroid injection.  We will continue to monitor and repeat as needed.   ROS  Constitutional: Denies any fever or chills Gastrointestinal: No reported hemesis, hematochezia, vomiting, or acute GI distress Musculoskeletal:  Low back, bilateral leg pain, lower extremity weakness, improved after epidural steroid injection.  Improved walking distance as well. Neurological: No reported episodes of acute onset apraxia, aphasia, dysarthria, agnosia, amnesia, paralysis, loss of coordination, or loss of consciousness  Medication Review  Abatacept, Butenafine HCl, Dextran 70-Hypromellose, Vitamin D3, albuterol, citalopram, diclofenac, fluticasone, fluticasone furoate-vilanterol, hydrochlorothiazide, leflunomide, loperamide, and pregabalin  History Review  Allergy: Eileen Maldonado is allergic to amlodipine, lisinopril, celebrex [celecoxib], methotrexate derivatives, bupropion, doxycycline, etanercept, and latex. Drug: Eileen Maldonado  reports no history of drug use. Alcohol:  reports no history of alcohol use. Tobacco:  reports that she has never smoked. She has never used smokeless tobacco. Social: Eileen Maldonado  reports that she has never smoked. She has never used smokeless tobacco. She reports that she does not drink alcohol and does not use drugs. Medical:  has a past medical history of Breast cancer ([place]), Collagen vascular disease ([place]), Hives, ILD (interstitial lung disease) ([place]), Pneumonia, PVD (peripheral vascular disease) ([place]), RA (rheumatoid arthritis) ([place]), Raynaud's phenomenon, and Right thyroid nodule. Surgical: Eileen Maldonado  has a past surgical history that includes appy; breast biopsy; hernia repair; Hernia repair; Knee Arthroplasty  (Left, 07/06/2016); Breast surgery (Left); Appendectomy; and Hysteroscopy with D & C (N/A, 12/22/2017). Family: family history  includes Breast cancer in her mother; Hypertension in her father and mother; Stroke in her father.  Laboratory Chemistry Profile   Renal Lab Results  Component Value Date   BUN 19 12/19/2017   CREATININE 1.06 (H) 12/19/2017   GFRAA 59 (L) 12/19/2017   GFRNONAA 51 (L) 12/19/2017    Hepatic Lab Results  Component Value Date   AST 25 06/22/2016   ALT 21 06/22/2016   ALBUMIN 4.1 06/22/2016   ALKPHOS 76 06/22/2016   LIPASE 22 01/31/2016    Electrolytes Lab Results  Component Value Date   NA 138 12/22/2017   K 3.3 (L) 12/22/2017   CL 104 12/19/2017   CALCIUM 9.1 12/19/2017    Bone No results found for: "VD25OH", "VD125OH2TOT", "[id number]", "AO1308MV7", "25OHVITD1", "25OHVITD2", "84ONGEXB2", "TESTOFREE", "TESTOSTERONE"  Inflammation (CRP: Acute Phase) (ESR: Chronic Phase) Lab Results  Component Value Date   CRP <0.8 06/22/2016   ESRSEDRATE 11 06/22/2016   LATICACIDVEN 2.3 ([place]) 01/31/2016         Note: Above Lab results reviewed.   Physical Exam  General appearance: Well nourished, well developed, and well hydrated. In no apparent acute distress Mental status: Alert, oriented x 3 (person, place, & time)       Respiratory: No evidence of acute respiratory distress Eyes: PERLA Vitals: BP 139/87   Pulse 75   Temp (!) 97.3 F (36.3 C) (Temporal)   Resp 16   Ht _0  (1.6 m)   Wt 154 lb (69.9 kg)   SpO2 100%   BMI 27.28 kg/m  BMI: Estimated body mass index is 27.28 kg/m as calculated from the following:   Height as of this encounter: _1  (1.6 m).   Weight as of this encounter: 154 lb (69.9 kg). Ideal: Ideal body weight: 52.4 kg (115 lb 8.3 oz) Adjusted ideal body weight: 59.4 kg (130 lb 14.6 oz)    Lumbar Spine Area Exam  Skin & Axial Inspection: No masses, redness, or swelling Alignment: Symmetrical Functional ROM: Pain restricted ROM        Stability: No instability detected Muscle Tone/Strength: Functionally intact. No obvious neuro-muscular anomalies detected. Sensory (Neurological): Neurogenic pain pattern Palpation: No palpable anomalies           Gait & Posture Assessment  Ambulation: Limited Gait: Antalgic gait (limping) Posture: Difficulty standing up straight, due to pain   Lower Extremity Exam      Side: Right lower extremity   Side: Left lower extremity  Stability: No instability observed           Stability: No instability observed          Skin & Extremity Inspection: Skin color, temperature, and hair growth are WNL. No peripheral edema or cyanosis. No masses, redness, swelling, asymmetry, or associated skin lesions. No contractures.   Skin & Extremity Inspection: Skin color, temperature, and hair growth are WNL. No peripheral edema or cyanosis. No masses, redness, swelling, asymmetry, or associated skin lesions. No contractures.  Functional ROM: Pain restricted ROM for hip and knee joints           Functional ROM: Pain restricted ROM for hip and knee joints          Muscle Tone/Strength: Functionally intact. No obvious neuro-muscular anomalies detected.   Muscle Tone/Strength: Functionally intact. No obvious neuro-muscular anomalies detected.  Sensory (Neurological): Neurogenic pain, improved after LESI        Sensory (Neurological): Neurogenic pain pattern, improved after LESI        DTR:  Patellar: deferred today Achilles: deferred today Plantar: deferred today   DTR: Patellar: deferred today Achilles: deferred today Plantar: deferred today  Palpation: No palpable anomalies   Palpation: No palpable anomalies       Assessment   Diagnosis Status  1. Spinal stenosis, lumbar region, with neurogenic claudication   2. Chronic radicular lumbar pain   3. Chronic pain syndrome    Controlled Controlled Controlled     Plan of Care    Ms. Ellary R Tofte has a current medication list which  includes the following long-term medication(s): abatacept, albuterol, citalopram, fluticasone, hydrochlorothiazide, leflunomide, and pregabalin.  Pharmacotherapy (Medications Ordered):  Patient states that she would like to trial Lyrica instead of gabapentin.  Prescription for Lyrica as below. Meds ordered this encounter  Medications   DISCONTD: pregabalin (LYRICA) 25 MG capsule    Sig: Take 2 capsules (50 mg total) by mouth at bedtime.    Dispense:  60 capsule    Refill:  5   pregabalin (LYRICA) 25 MG capsule    Sig: Take 2 capsules (50 mg total) by mouth at bedtime.    Dispense:  180 capsule    Refill:  1   Good results with previous lumbar epidural steroid injection, consider repeating in future.  Overall pain is 50% better and she has improved walking ability as well.   Orders:  No orders of the defined types were placed in this encounter.  Follow-up plan:   Return in about 6 months (around 09/16/2022) for Medication Management, in person.     L4-L5 ESI 12/13/2021    Recent Visits Date Type Provider Dept  01/12/22 Office Visit Gillis Santa, MD [facility]  Showing recent visits within past 90 days and meeting all other requirements Today's Visits Date Type Provider Dept  03/17/22 Office Visit Gillis Santa, MD [facility]  Showing today's visits and meeting all other requirements Future Appointments No visits were found meeting these conditions. Showing future appointments within next 90 days and meeting all other requirements  I discussed the assessment and treatment plan with the patient. The patient was provided an opportunity to ask questions and all were answered. The patient agreed with the plan and demonstrated an understanding of the instructions.  Patient advised to call back or seek an in-person evaluation if the symptoms or condition worsens.  Duration of encounter: 55mnutes.  Total time on encounter, as per AMA guidelines included both  the face-to-face and non-face-to-face time personally spent by the physician and/or other qualified health care professional(s) on the day of the encounter (includes time in activities that require the physician or other qualified health care professional and does not include time in activities normally performed by clinical staff). Physician's time may include the following activities when performed: preparing to see the patient (eg, review of tests, pre-charting review of records) obtaining and/or reviewing separately obtained history performing a medically appropriate examination and/or evaluation counseling and educating the patient/family/caregiver ordering medications, tests, or procedures referring and communicating with other health care professionals (when not separately reported) documenting clinical information in the electronic or other health record independently interpreting results (not separately reported) and communicating results to the patient/ family/caregiver care coordination (not separately reported)  Note by: BGillis Santa MD Date: 03/17/2022; Time: 12:21 PM

## 2022-04-13 ENCOUNTER — Encounter
Admission: RE | Disposition: A | Discharge status: Home / Self Care | Payer: Self-pay | Source: Home / Self Care | Attending: Internal Medicine

## 2022-04-13 ENCOUNTER — Ambulatory Visit: Payer: Medicare Other | Admitting: Registered Nurse

## 2022-04-13 ENCOUNTER — Ambulatory Visit
Admission: RE | Admit: 2022-04-13 | Discharge: 2022-04-13 | Disposition: A | Discharge status: Home / Self Care | Payer: Medicare Other | Attending: Internal Medicine | Admitting: Internal Medicine

## 2022-04-13 ENCOUNTER — Encounter: Payer: Self-pay | Admitting: Internal Medicine

## 2022-04-13 DIAGNOSIS — Z79899 Other long term (current) drug therapy: Secondary | ICD-10-CM | POA: Diagnosis not present

## 2022-04-13 DIAGNOSIS — I10 Essential (primary) hypertension: Secondary | ICD-10-CM | POA: Diagnosis not present

## 2022-04-13 DIAGNOSIS — G709 Myoneural disorder, unspecified: Secondary | ICD-10-CM | POA: Insufficient documentation

## 2022-04-13 DIAGNOSIS — K5730 Diverticulosis of large intestine without perforation or abscess without bleeding: Secondary | ICD-10-CM | POA: Insufficient documentation

## 2022-04-13 DIAGNOSIS — Z1211 Encounter for screening for malignant neoplasm of colon: Secondary | ICD-10-CM | POA: Diagnosis not present

## 2022-04-13 HISTORY — PX: COLONOSCOPY: SHX5424

## 2022-04-13 HISTORY — DX: Essential (primary) hypertension: I10

## 2022-04-13 SURGERY — COLONOSCOPY
Anesthesia: General

## 2022-04-13 MED ORDER — PROPOFOL 500 MG/50ML IV EMUL
INTRAVENOUS | Status: DC | PRN
  Administered 2022-04-13: 100 ug/kg/min via INTRAVENOUS

## 2022-04-13 MED ORDER — LIDOCAINE HCL (PF) 2 % IJ SOLN
INTRAMUSCULAR | Status: AC
  Filled 2022-04-13: qty 5

## 2022-04-13 MED ORDER — PROPOFOL 10 MG/ML IV BOLUS
INTRAVENOUS | Status: DC | PRN
  Administered 2022-04-13: 20 mg via INTRAVENOUS
  Administered 2022-04-13: 40 mg via INTRAVENOUS

## 2022-04-13 MED ORDER — LIDOCAINE HCL (CARDIAC) PF 100 MG/5ML IV SOSY
PREFILLED_SYRINGE | INTRAVENOUS | Status: DC | PRN
  Administered 2022-04-13: 20 mg via INTRAVENOUS

## 2022-04-13 MED ORDER — SODIUM CHLORIDE 0.9 % IV SOLN: INTRAVENOUS | Status: DC

## 2022-04-13 NOTE — Op Note (Signed)
[facility] Gastroenterology Patient Name: Eileen Maldonado Procedure Date: 04/13/2022 9:43 AM MRN: [id number] Account #: [id number] Date of Birth: 03/31/1945 Admit Type: Outpatient Age: 77 Room: [facility] ENDO ROOM 2 Gender: Female Note Status: Finalized Instrument Name: Jasper Riling [id number] Procedure:             Colonoscopy Indications:           Screening for colorectal malignant neoplasm Providers:             Lorie Apley K. Alice Reichert MD, MD Referring MD:          Irven Easterly. Kary Kos, MD (Referring MD) Medicines:             Propofol per Anesthesia Complications:         No immediate complications. Procedure:             Pre-Anesthesia Assessment:                        - The risks and benefits of the procedure and the                         sedation options and risks were discussed with the                         patient. All questions were answered and informed                         consent was obtained.                        - Patient identification and proposed procedure were                         verified prior to the procedure by the nurse. The                         procedure was verified in the procedure room.                        - ASA Grade Assessment: III - A patient with severe                         systemic disease.                        - After reviewing the risks and benefits, the patient                         was deemed in satisfactory condition to undergo the                         procedure.                        After obtaining informed consent, the colonoscope was                         passed under direct vision. Throughout the procedure,  the patient's blood pressure, pulse, and oxygen                         saturations were monitored continuously. The                         Colonoscope was introduced through the anus and                         advanced to the the cecum, identified by appendiceal                          orifice and ileocecal valve. The colonoscopy was                         performed without difficulty. The patient tolerated                         the procedure well. The quality of the bowel                         preparation was adequate. The ileocecal valve,                         appendiceal orifice, and rectum were photographed. Findings:      The perianal and digital rectal examinations were normal. Pertinent       negatives include normal sphincter tone and no palpable rectal lesions.      Many medium-mouthed and small-mouthed diverticula were found in the left       colon.      The exam was otherwise without abnormality on direct and retroflexion       views. Impression:            - Diverticulosis in the left colon.                        - The examination was otherwise normal on direct and                         retroflexion views.                        - No specimens collected. Recommendation:        - Patient has a contact number available for                         emergencies. The signs and symptoms of potential                         delayed complications were discussed with the patient.                         Return to normal activities tomorrow. Written                         discharge instructions were provided to the patient.                        - Resume previous diet.                        -  Continue present medications.                        - No repeat colonoscopy due to current age (59 years                         or older) and the absence of colonic polyps.                        - You do NOT require further colon cancer screening                         measures (Annual stool testing (i.e. hemoccult, FIT,                         cologuard), sigmoidoscopy, colonoscopy or CT                         colonography). You should share this recommendation                         with your Primary Care provider.                        -  Return to GI office PRN.                        - The findings and recommendations were discussed with                         the patient. Procedure Code(s):     --- Professional ---                        V2536, Colorectal cancer screening; colonoscopy on                         individual not meeting criteria for high risk Diagnosis Code(s):     --- Professional ---                        K57.30, Diverticulosis of large intestine without                         perforation or abscess without bleeding                        Z12.11, Encounter for screening for malignant neoplasm                         of colon CPT copyright 2022 [employer]. All rights reserved. The codes documented in this report are preliminary and upon coder review may  be revised to meet current compliance requirements. Efrain Sella MD, MD 04/13/2022 10:03:45 AM This report has been signed electronically. Number of Addenda: 0 Note Initiated On: 04/13/2022 9:43 AM Scope Withdrawal Time: 0 hours 5 minutes 59 seconds  Total Procedure Duration: 0 hours 10 minutes 43 seconds  Estimated Blood Loss:  Estimated blood loss: none.      [facility]

## 2022-04-13 NOTE — Anesthesia Postprocedure Evaluation (Signed)
Anesthesia Post Note  Patient: Eileen Maldonado  Procedure(s) Performed: COLONOSCOPY  Patient location during evaluation: PACU Anesthesia Type: General Level of consciousness: awake and alert Pain management: pain level controlled Vital Signs Assessment: post-procedure vital signs reviewed and stable Respiratory status: spontaneous breathing, nonlabored ventilation, respiratory function stable and patient connected to nasal cannula oxygen Cardiovascular status: blood pressure returned to baseline and stable Postop Assessment: no apparent nausea or vomiting Anesthetic complications: no   No notable events documented.   Last Vitals:  Vitals:   04/13/22 1015 04/13/22 1025  BP: 117/75 134/79  Pulse: (!) 58 64  Resp: 13 15  Temp:    SpO2: 97% 100%    Last Pain:  Vitals:   04/13/22 1025  TempSrc:   PainSc: 0-No pain                 Molli Barrows

## 2022-04-13 NOTE — Transfer of Care (Signed)
Immediate Anesthesia Transfer of Care Note  Patient: Eileen Maldonado  Procedure(s) Performed: COLONOSCOPY  Patient Location: PACU  Anesthesia Type:General  Level of Consciousness: drowsy  Airway & Oxygen Therapy: Patient Spontanous Breathing   Post-op Assessment: Report given to RN and Post -op Vital signs reviewed and stable  Post vital signs: Reviewed and stable  Last Vitals:  Vitals Value Taken Time  BP 101/66 04/13/22 1005  Temp 97   Pulse 66 04/13/22 1004  Resp 13 04/13/22 1004  SpO2 96 % 04/13/22 1004  Vitals shown include unvalidated device data.  Last Pain:  Vitals:   04/13/22 0812  TempSrc: Temporal         Complications: No notable events documented.

## 2022-04-13 NOTE — H&P (Signed)
Outpatient short stay form Pre-procedure 04/13/2022 9:35 AM Eileen Maldonado [name], M.D.  Primary Physician: Maryland Pink, M.D.  Reason for visit:  Colon cancer screening  History of present illness:  Patient presents for colonoscopy for colon cancer screening. The patient denies complaints of abdominal pain, significant change in bowel habits, or rectal bleeding.      Current Facility-Administered Medications:    0.9 %  sodium chloride infusion, , Intravenous, Continuous, [place], Benay Pike, MD, Last Rate: 20 mL/hr at 04/13/22 0827, New Bag at 04/13/22 0827  Medications Prior to Admission  Medication Sig Dispense Refill Last Dose   albuterol (PROVENTIL HFA;VENTOLIN HFA) 108 (90 Base) MCG/ACT inhaler Inhale 2 puffs into the lungs every 6 (six) hours as needed for wheezing or shortness of breath.   04/13/2022   diclofenac (VOLTAREN) 50 MG EC tablet Take 50 mg by mouth 2 (two) times daily.   04/12/2022   hydrochlorothiazide (HYDRODIURIL) 25 MG tablet Take 25 mg by mouth daily.   04/13/2022   leflunomide (ARAVA) 10 MG tablet Take 10 mg by mouth daily.    04/12/2022   pregabalin (LYRICA) 25 MG capsule Take 2 capsules (50 mg total) by mouth at bedtime. 180 capsule 1 04/12/2022   Abatacept (ORENCIA [place]) Inject 1 Dose into the skin once a week.   04/10/2022   Butenafine HCl 1 % cream Apply 1 Application topically 2 (two) times daily.      Cholecalciferol (VITAMIN D3) 1000 units CAPS Take 1,000 Units by mouth daily.      citalopram (CELEXA) 10 MG tablet Take 1 tablet by mouth daily.      Dextran 70-Hypromellose 0.1-0.3 % SOLN Place 1 drop into both eyes daily as needed for dry eyes.      fluticasone (FLONASE) 50 MCG/ACT nasal spray Place 1 spray into both nostrils daily as needed for allergies or rhinitis.       fluticasone furoate-vilanterol (BREO ELLIPTA) 100-25 MCG/ACT AEPB Inhale into the lungs.      loperamide (IMODIUM A-D) 2 MG tablet Take 2 mg by mouth 2 (two) times daily.        Allergies   Allergen Reactions   Amlodipine Swelling   Lisinopril Other (See Comments)   Celebrex [Celecoxib] Other (See Comments)    Hair loss and cough   Methotrexate Derivatives Other (See Comments)    Hair loss and cough   Bupropion Other (See Comments)    Insomnia Insomnia  Other reaction(s): Other (See Comments) Insomnia   Doxycycline Other (See Comments)    nightmares Nightmares  Other reaction(s): Other (See Comments) Nightmares   Etanercept Rash   Latex Rash    From a knee brace     Past Medical History:  Diagnosis Date   Breast cancer ([place])    Left Breast   Collagen vascular disease ([place])    Hives    Hypertension    ILD (interstitial lung disease) ([place])    Pneumonia    PVD (peripheral vascular disease) ([facility])    RA (rheumatoid arthritis) ([place])    Raynaud's phenomenon    Right thyroid nodule     Review of systems:  Otherwise negative.    Physical Exam  Gen: Alert, oriented. Appears stated age.  HEENT: [place]/AT. PERRLA. Lungs: CTA, no wheezes. CV: RR nl S1, S2. Abd: soft, benign, no masses. BS+ Ext: No edema. Pulses 2+    Planned procedures: Proceed with colonoscopy. The patient understands the nature of the planned procedure, indications, risks, alternatives and potential complications including  but not limited to bleeding, infection, perforation, damage to internal organs and possible oversedation/side effects from anesthesia. The patient agrees and gives consent to proceed.  Please refer to procedure notes for findings, recommendations and patient disposition/instructions.     [name], M.D. Gastroenterology 04/13/2022  9:35 AM

## 2022-04-13 NOTE — Anesthesia Preprocedure Evaluation (Signed)
Anesthesia Evaluation  Patient identified by MRN, date of birth, ID band Patient awake    Reviewed: Allergy & Precautions, H&P , NPO status , Patient's Chart, lab work & pertinent test results, reviewed documented beta blocker date and time   Airway Mallampati: II   Neck ROM: full    Dental  (+) Poor Dentition   Pulmonary pneumonia, resolved   Pulmonary exam normal        Cardiovascular Exercise Tolerance: Good hypertension, On Medications negative cardio ROS Normal cardiovascular exam Rhythm:regular Rate:Normal     Neuro/Psych  Neuromuscular disease  negative psych ROS   GI/Hepatic negative GI ROS, Neg liver ROS,,,  Endo/Other  negative endocrine ROS    Renal/GU negative Renal ROS  negative genitourinary   Musculoskeletal   Abdominal   Peds  Hematology negative hematology ROS (+)   Anesthesia Other Findings Past Medical History: No date: Breast cancer ([place])     Comment:  Left Breast No date: Collagen vascular disease ([facility]) No date: Hives No date: Hypertension No date: ILD (interstitial lung disease) ([facility]) No date: Pneumonia No date: PVD (peripheral vascular disease) ([facility]) No date: RA (rheumatoid arthritis) ([place]) No date: Raynaud's phenomenon No date: Right thyroid nodule Past Surgical History: No date: APPENDECTOMY No date: appy No date: breast biopsy No date: BREAST SURGERY; Left     Comment:  biopsy of milk duct No date: hernia repair No date: HERNIA REPAIR     Comment:  Umbilical Hernia 08/03/3265: HYSTEROSCOPY WITH D & C; N/A     Comment:  Procedure: DILATATION AND CURETTAGE /HYSTEROSCOPY;                Surgeon: Benjaman Kindler, MD;  Location: [facility] ORS;                Service: Gynecology;  Laterality: N/A; 07/06/2016: KNEE ARTHROPLASTY; Left     Comment:  Procedure: COMPUTER ASSISTED TOTAL KNEE ARTHROPLASTY;                Surgeon: Dereck Leep, MD;  Location: [facility] ORS;                 Service: Orthopedics;  Laterality: Left; BMI    Body Mass Index: 26.75 kg/m     Reproductive/Obstetrics negative OB ROS                             Anesthesia Physical Anesthesia Plan  ASA: 3  Anesthesia Plan: General   Post-op Pain Management:    Induction:   PONV Risk Score and Plan:   Airway Management Planned:   Additional Equipment:   Intra-op Plan:   Post-operative Plan:   Informed Consent: I have reviewed the patients History and Physical, chart, labs and discussed the procedure including the risks, benefits and alternatives for the proposed anesthesia with the patient or authorized representative who has indicated his/her understanding and acceptance.     Dental Advisory Given  Plan Discussed with: CRNA  Anesthesia Plan Comments:        Anesthesia Quick Evaluation

## 2022-04-13 NOTE — Interval H&P Note (Signed)
History and Physical Interval Note:  04/13/2022 9:37 AM  Eileen Maldonado  has presented today for surgery, with the diagnosis of Colon cancer screening (Z12.11).  The various methods of treatment have been discussed with the patient and family. After consideration of risks, benefits and other options for treatment, the patient has consented to  Procedure(s): COLONOSCOPY (N/A) as a surgical intervention.  The patient's history has been reviewed, patient examined, no change in status, stable for surgery.  I have reviewed the patient's chart and labs.  Questions were answered to the patient's satisfaction.     [place], [place]

## 2022-04-14 ENCOUNTER — Encounter: Payer: Self-pay | Admitting: Internal Medicine

## 2022-07-26 ENCOUNTER — Ambulatory Visit
Payer: Medicare Other | Attending: Student in an Organized Health Care Education/Training Program | Admitting: Student in an Organized Health Care Education/Training Program

## 2022-07-26 ENCOUNTER — Encounter: Payer: Self-pay | Admitting: Student in an Organized Health Care Education/Training Program

## 2022-07-26 VITALS — BP 149/83 | HR 90 | Temp 97.4°F | Ht 62.0 in | Wt 153.0 lb

## 2022-07-26 DIAGNOSIS — G894 Chronic pain syndrome: Secondary | ICD-10-CM | POA: Diagnosis present

## 2022-07-26 DIAGNOSIS — G8929 Other chronic pain: Secondary | ICD-10-CM | POA: Insufficient documentation

## 2022-07-26 DIAGNOSIS — M48062 Spinal stenosis, lumbar region with neurogenic claudication: Secondary | ICD-10-CM | POA: Insufficient documentation

## 2022-07-26 DIAGNOSIS — M5416 Radiculopathy, lumbar region: Secondary | ICD-10-CM | POA: Insufficient documentation

## 2022-07-26 NOTE — Progress Notes (Signed)
PROVIDER NOTE: Information contained herein reflects review and annotations entered in association with encounter. Interpretation of such information and data should be left to medically-trained personnel. Information provided to patient can be located elsewhere in the medical record under "Patient Instructions". Document created using STT-dictation technology, any transcriptional errors that may result from process are unintentional.    Patient: Eileen Maldonado  Service Category: E/M  Provider: Edward Jolly, MD  DOB: 07-21-1945  DOS: 07/26/2022  Referring Provider: Jerl Mina, MD  MRN: [id number]  Specialty: Interventional Pain Management  PCP: Jerl Mina, MD  Type: Established Patient  Setting: Ambulatory outpatient    Location: Office  Delivery: Face-to-face     HPI  Ms. Eileen Maldonado, a 77 y.o. 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 Ms. Eileen Maldonado's primary complain today is Back Pain (lower)  Pertinent problems: Ms. Eileen Maldonado has Rheumatoid arthritis (HCC); S/P total knee arthroplasty; Chronic bilateral low back pain with bilateral sciatica; Spinal stenosis, lumbar region, with neurogenic claudication; Chronic radicular lumbar pain; and Lumbar spondylosis on their pertinent problem list. Pain Assessment: Severity of Chronic pain is reported as a 7 /10. Location: Back Right, Left, Lower/radiates through buttocks and thighs. Onset: More than a month ago. Quality: Aching, Constant. Timing: Constant. Modifying factor(s): meds. Vitals:  height is 5\' 2"  (1.575 m) and weight is 153 lb (69.4 kg). Her temporal temperature is 97.4 F (36.3 C) (abnormal). Her blood pressure is 149/83 (abnormal) and her pulse is 90. Her oxygen saturation is 97%.  BMI: Estimated body mass index is 27.98 kg/m as calculated from the following:   Height as of this encounter: 5\' 2"  (1.575 m).   Weight as of this encounter: 153 lb (69.4  kg). Last encounter: 03/17/2022. Last procedure: 12/13/2021.  Reason for encounter: evaluation of worsening, or previously known (established) problem.   Increased lumbar spine pain with radiation into bilateral legs in dermatomal fashion, s/p L-ESI 12-13-21 which provided 70% pain relief for approx 6 months. Given return of pain patient would like to repeat L-ESI She was previously on Lyrica but had to d/c given increase in weight and LE edema Has also tried Gabapentin without benefit and side effects of sedation Engages in home stretching and strengthening exercises for lumbar spine   ROS  Constitutional: Denies any fever or chills Gastrointestinal: No reported hemesis, hematochezia, vomiting, or acute GI distress Musculoskeletal:  low back pain with radiation into bilateral legs Neurological: No reported episodes of acute onset apraxia, aphasia, dysarthria, agnosia, amnesia, paralysis, loss of coordination, or loss of consciousness  Medication Review  Abatacept, Butenafine HCl, Dextran 70-Hypromellose, Vitamin D3, albuterol, citalopram, conjugated estrogens, diclofenac, fluticasone, fluticasone furoate-vilanterol, hydrochlorothiazide, leflunomide, loperamide, and pregabalin  History Review  Allergy: Ms. Eileen Maldonado is allergic to amlodipine, lisinopril, celebrex [celecoxib], methotrexate derivatives, bupropion, doxycycline, etanercept, and latex. Drug: Ms. Eileen Maldonado  reports no history of drug use. Alcohol:  reports no history of alcohol use. Tobacco:  reports that she has never smoked. She has never used smokeless tobacco. Social: Ms. Eileen Maldonado  reports that she has never smoked. She has never used smokeless tobacco. She reports that she does not drink alcohol and does not use drugs. Medical:  has a past medical history of Breast cancer (HCC), Collagen vascular disease (HCC), Hives, Hypertension, ILD (interstitial lung disease) (HCC), Pneumonia, PVD (peripheral vascular disease) (HCC), RA  (rheumatoid arthritis) (HCC), Raynaud's phenomenon, and Right thyroid nodule. Surgical: Ms. Eileen Maldonado  has a past surgical history that  includes appy; breast biopsy; hernia repair; Hernia repair; Knee Arthroplasty (Left, 07/06/2016); Breast surgery (Left); Appendectomy; Hysteroscopy with D & C (N/A, 12/22/2017); and Colonoscopy (N/A, 04/13/2022). Family: family history includes Breast cancer in her mother; Hypertension in her father and mother; Stroke in her father.  Laboratory Chemistry Profile   Renal Lab Results  Component Value Date   BUN 19 12/19/2017   CREATININE 1.06 (H) 12/19/2017   GFRAA 59 (L) 12/19/2017   GFRNONAA 51 (L) 12/19/2017    Hepatic Lab Results  Component Value Date   AST 25 06/22/2016   ALT 21 06/22/2016   ALBUMIN 4.1 06/22/2016   ALKPHOS 76 06/22/2016   LIPASE 22 01/31/2016    Electrolytes Lab Results  Component Value Date   NA 138 12/22/2017   K 3.3 (L) 12/22/2017   CL 104 12/19/2017   CALCIUM 9.1 12/19/2017    Bone No results found for: "VD25OH", "VD125OH2TOT", "HQ4696EX5", "MW4132GM0", "25OHVITD1", "25OHVITD2", "25OHVITD3", "TESTOFREE", "TESTOSTERONE"  Inflammation (CRP: Acute Phase) (ESR: Chronic Phase) Lab Results  Component Value Date   CRP <0.8 06/22/2016   ESRSEDRATE 11 06/22/2016   LATICACIDVEN 2.3 (HH) 01/31/2016         Note: Above Lab results reviewed.  MR LUMBAR SPINE WO CONTRAST   Narrative CLINICAL DATA:  Bilateral low back, buttock and leg pain.   EXAM: MRI LUMBAR SPINE WITHOUT CONTRAST   TECHNIQUE: Multiplanar, multisequence MR imaging of the lumbar spine was performed. No intravenous contrast was administered.   COMPARISON:  None.   FINDINGS: Segmentation:  5 lumbar type vertebral bodies.   Alignment:  Normal   Vertebrae:  No fracture or primary lesion.   Conus medullaris: Extends to the L1-2 level and appears normal.   Paraspinal and other soft tissues: Negative   Disc levels:   Minimal non-compressive disc  bulges at T11-12 and T12-L1.   L1-2 and L2-3 are normal.   L3-4: Mild bulging of the disc. Mild facet and ligamentous hypertrophy. No compressive stenosis.   L4-5: Disc degeneration with broad-based herniation of slightly more prominent towards the right. Bilateral facet and ligamentous hypertrophy. Fluid in the facet joints. Severe spinal stenosis. This could worsen with standing or flexion.   L5-S1: Shallow broad-based disc herniation. Facet and ligamentous hypertrophy with fluid in the joints. Stenosis of the lateral recesses that could worsen with standing or flexion. Neural compression could occur.   IMPRESSION: Severe multifactorial spinal stenosis at L4-5. This could worsen with standing or flexion.   Bilateral lateral recess stenosis at L5-S1. This could worsen with standing or flexion.   Non-compressive disc bulge and mild facet hypertrophy at L3-4.     Electronically Signed By: Paulina Fusi M.D. On: 05/04/2016 10:50    Physical Exam  General appearance: Well nourished, well developed, and well hydrated. In no apparent acute distress Mental status: Alert, oriented x 3 (person, place, & time)       Respiratory: No evidence of acute respiratory distress Eyes: PERLA Vitals: BP (!) 149/83   Pulse 90   Temp (!) 97.4 F (36.3 C) (Temporal)   Ht 5\' 2"  (1.575 m)   Wt 153 lb (69.4 kg)   SpO2 97%   BMI 27.98 kg/m  BMI: Estimated body mass index is 27.98 kg/m as calculated from the following:   Height as of this encounter: 5\' 2"  (1.575 m).   Weight as of this encounter: 153 lb (69.4 kg). Ideal: Ideal body weight: 50.1 kg (110 lb 7.2 oz) Adjusted ideal body weight: 57.8 kg (  127 lb 7.5 oz)  Lumbar Spine Area Exam  Skin & Axial Inspection: No masses, redness, or swelling Alignment: Symmetrical Functional ROM: Pain restricted ROM       Stability: No instability detected Muscle Tone/Strength: Functionally intact. No obvious neuro-muscular anomalies  detected. Sensory (Neurological): Neurogenic pain pattern Palpation: No palpable anomalies           Gait & Posture Assessment  Ambulation: Limited Gait: Antalgic gait (limping) Posture: Difficulty standing up straight, due to pain   Lower Extremity Exam      Side: Right lower extremity   Side: Left lower extremity  Stability: No instability observed           Stability: No instability observed          Skin & Extremity Inspection: Skin color, temperature, and hair growth are WNL. No peripheral edema or cyanosis. No masses, redness, swelling, asymmetry, or associated skin lesions. No contractures.   Skin & Extremity Inspection: Skin color, temperature, and hair growth are WNL. No peripheral edema or cyanosis. No masses, redness, swelling, asymmetry, or associated skin lesions. No contractures.  Functional ROM: Pain restricted ROM for hip and knee joints           Functional ROM: Pain restricted ROM for hip and knee joints          Muscle Tone/Strength: Functionally intact. No obvious neuro-muscular anomalies detected.   Muscle Tone/Strength: Functionally intact. No obvious neuro-muscular anomalies detected.  Sensory (Neurological): Neurogenic pain pattern         Sensory (Neurological): Neurogenic pain pattern        DTR: Patellar: deferred today Achilles: deferred today Plantar: deferred today   DTR: Patellar: deferred today Achilles: deferred today Plantar: deferred today  Palpation: No palpable anomalies   Palpation: No palpable anomalies     Assessment   Diagnosis Status  1. Spinal stenosis, lumbar region, with neurogenic claudication   2. Chronic radicular lumbar pain   3. Chronic pain syndrome    Having a Flare-up Having a Flare-up Having a Flare-up   Updated Problems: No problems updated.  Plan of Care  Problem-specific:  No problem-specific Assessment & Plan notes found for this encounter.  Ms. Patrese R Goren has a current medication list which includes the  following long-term medication(s): abatacept, albuterol, citalopram, fluticasone, hydrochlorothiazide, leflunomide, and pregabalin.  Pharmacotherapy (Medications Ordered): No orders of the defined types were placed in this encounter.  Orders:  Orders Placed This Encounter  Procedures   Lumbar Epidural Injection    Standing Status:   Future    Standing Expiration Date:   10/25/2022    Scheduling Instructions:     Procedure: Interlaminar Lumbar Epidural Steroid injection (LESI)            Laterality: Midline L4/5     Sedation: po valium     Timeframe: ASAA    Order Specific Question:   Where will this procedure be performed?    Answer:   [facility]   Follow-up plan:   Return in about 20 days (around 08/15/2022) for L4/5 ESI , in clinic (PO Valium).      L4-L5 ESI 12/13/2021      Recent Visits No visits were found meeting these conditions. Showing recent visits within past 90 days and meeting all other requirements Today's Visits Date Type Provider Dept  07/26/22 Office Visit Edward Jolly, MD [facility]  Showing today's visits and meeting all other requirements Future Appointments Date Type Provider Dept  08/15/22 Appointment Edward Jolly, MD [facility]  09/13/22 Appointment Edward Jolly, MD [facility]  Showing future appointments within next 90 days and meeting all other requirements  I discussed the assessment and treatment plan with the patient. The patient was provided an opportunity to ask questions and all were answered. The patient agreed with the plan and demonstrated an understanding of the instructions.  Patient advised to call back or seek an in-person evaluation if the symptoms or condition worsens.  Duration of encounter: .  Total time on encounter, as per AMA guidelines included both the face-to-face and non-face-to-face time personally spent by the physician and/or other qualified health care professional(s)  on the day of the encounter (includes time in activities that require the physician or other qualified health care professional and does not include time in activities normally performed by clinical staff). Physician's time may include the following activities when performed: Preparing to see the patient (e.g., pre-charting review of records, searching for previously ordered imaging, lab work, and nerve conduction tests) Review of prior analgesic pharmacotherapies. Reviewing PMP Interpreting ordered tests (e.g., lab work, imaging, nerve conduction tests) Performing post-procedure evaluations, including interpretation of diagnostic procedures Obtaining and/or reviewing separately obtained history Performing a medically appropriate examination and/or evaluation Counseling and educating the patient/family/caregiver Ordering medications, tests, or procedures Referring and communicating with other health care professionals (when not separately reported) Documenting clinical information in the electronic or other health record Independently interpreting results (not separately reported) and communicating results to the patient/ family/caregiver Care coordination (not separately reported)  Note by: Edward Jolly, MD Date: 07/26/2022; Time: 1:25 PM

## 2022-07-26 NOTE — Patient Instructions (Signed)
____________________________________________________________________________________________  General Risks and Possible Complications  Patient Responsibilities: It is important that you read this as it is part of your informed consent. It is our duty to inform you of the risks and possible complications associated with treatments offered to you. It is your responsibility as a patient to read this and to ask questions about anything that is not clear or that you believe was not covered in this document.  Patient's Rights: You have the right to refuse treatment. You also have the right to change your mind, even after initially having agreed to have the treatment done. However, under this last option, if you wait until the last second to change your mind, you may be charged for the materials used up to that point.  Introduction: Medicine is not an exact science. Everything in Medicine, including the lack of treatment(s), carries the potential for danger, harm, or loss (which is by definition: Risk). In Medicine, a complication is a secondary problem, condition, or disease that can aggravate an already existing one. All treatments carry the risk of possible complications. The fact that a side effects or complications occurs, does not imply that the treatment was conducted incorrectly. It must be clearly understood that these can happen even when everything is done following the highest safety standards.  No treatment: You can choose not to proceed with the proposed treatment alternative. The "PRO(s)" would include: avoiding the risk of complications associated with the therapy. The "CON(s)" would include: not getting any of the treatment benefits. These benefits fall under one of three categories: diagnostic; therapeutic; and/or palliative. Diagnostic benefits include: getting information which can ultimately lead to improvement of the disease or symptom(s). Therapeutic benefits are those associated with  the successful treatment of the disease. Finally, palliative benefits are those related to the decrease of the primary symptoms, without necessarily curing the condition (example: decreasing the pain from a flare-up of a chronic condition, such as incurable terminal cancer).  General Risks and Complications: These are associated to most interventional treatments. They can occur alone, or in combination. They fall under one of the following six (6) categories: no benefit or worsening of symptoms; bleeding; infection; nerve damage; allergic reactions; and/or death. No benefits or worsening of symptoms: In Medicine there are no guarantees, only probabilities. No healthcare provider can ever guarantee that a medical treatment will work, they can only state the probability that it may. Furthermore, there is always the possibility that the condition may worsen, either directly, or indirectly, as a consequence of the treatment. Bleeding: This is more common if the patient is taking a blood thinner, either prescription or over the counter (example: Goody Powders, Fish oil, Aspirin, Garlic, etc.), or if suffering a condition associated with impaired coagulation (example: Hemophilia, cirrhosis of the liver, low platelet counts, etc.). However, even if you do not have one on these, it can still happen. If you have any of these conditions, or take one of these drugs, make sure to notify your treating physician. Infection: This is more common in patients with a compromised immune system, either due to disease (example: diabetes, cancer, human immunodeficiency virus [HIV], etc.), or due to medications or treatments (example: therapies used to treat cancer and rheumatological diseases). However, even if you do not have one on these, it can still happen. If you have any of these conditions, or take one of these drugs, make sure to notify your treating physician. Nerve Damage: This is more common when the treatment is an    invasive one, but it can also happen with the use of medications, such as those used in the treatment of cancer. The damage can occur to small secondary nerves, or to large primary ones, such as those in the spinal cord and brain. This damage may be temporary or permanent and it may lead to impairments that can range from temporary numbness to permanent paralysis and/or brain death. Allergic Reactions: Any time a substance or material comes in contact with our body, there is the possibility of an allergic reaction. These can range from a mild skin rash (contact dermatitis) to a severe systemic reaction (anaphylactic reaction), which can result in death. Death: In general, any medical intervention can result in death, most of the time due to an unforeseen complication. ____________________________________________________________________________________________    ______________________________________________________________________  Preparing for your procedure  Appointments: If you think you may not be able to keep your appointment, call 24-48 hours in advance to cancel. We need time to make it available to others.  During your procedure appointment there will be: No Prescription Refills. No disability issues to discussed. No medication changes or discussions.  Instructions: Food intake: Avoid eating anything solid for at least 8 hours prior to your procedure. Clear liquid intake: You may take clear liquids such as water up to 2 hours prior to your procedure. (No carbonated drinks. No soda.) Transportation: Unless otherwise stated by your physician, bring a driver. Morning Medicines: Except for blood thinners, take all of your other morning medications with a sip of water. Make sure to take your heart and blood pressure medicines. If your blood pressure's lower number is above 100, the case will be rescheduled. Blood thinners: Make sure to stop your blood thinners as instructed.  If you take  a blood thinner, but were not instructed to stop it, call our office [phone] and ask to talk to a nurse. Not stopping a blood thinner prior to certain procedures could lead to serious complications. Diabetics on insulin: Notify the staff so that you can be scheduled 1st case in the morning. If your diabetes requires high dose insulin, take only  of your normal insulin dose the morning of the procedure and notify the staff that you have done so. Preventing infections: Shower with an antibacterial soap the morning of your procedure.  Build-up your immune system: Take 1000 mg of Vitamin C with every meal (3 times a day) the day prior to your procedure. Antibiotics: Inform the nursing staff if you are taking any antibiotics or if you have any conditions that may require antibiotics prior to procedures. (Example: recent joint implants)   Pregnancy: If you are pregnant make sure to notify the nursing staff. Not doing so may result in injury to the fetus, including death.  Sickness: If you have a cold, fever, or any active infections, call and cancel or reschedule your procedure. Receiving steroids while having an infection may result in complications. Arrival: You must be in the facility at least 30 minutes prior to your scheduled procedure. Tardiness: Your scheduled time is also the cutoff time. If you do not arrive at least 15 minutes prior to your procedure, you will be rescheduled.  Children: Do not bring any children with you. Make arrangements to keep them home. Dress appropriately: There is always a possibility that your clothing may get soiled. Avoid long dresses. Valuables: Do not bring any jewelry or valuables.  Reasons to call and reschedule or cancel your procedure: (Following these recommendations will minimize the risk   of a serious complication.) Surgeries: Avoid having procedures within 2 weeks of any surgery. (Avoid for 2 weeks before or after any surgery). Flu Shots: Avoid having  procedures within 2 weeks of a flu shots or . (Avoid for 2 weeks before or after immunizations). Barium: Avoid having a procedure within 7-10 days after having had a radiological study involving the use of radiological contrast. (Myelograms, Barium swallow or enema study). Heart attacks: Avoid any elective procedures or surgeries for the initial 6 months after a "Myocardial Infarction" (Heart Attack). Blood thinners: It is imperative that you stop these medications before procedures. Let us know if you if you take any blood thinner.  Infection: Avoid procedures during or within two weeks of an infection (including chest colds or gastrointestinal problems). Symptoms associated with infections include: Localized redness, fever, chills, night sweats or profuse sweating, burning sensation when voiding, cough, congestion, stuffiness, runny nose, sore throat, diarrhea, nausea, vomiting, cold or Flu symptoms, recent or current infections. It is specially important if the infection is over the area that we intend to treat. Heart and lung problems: Symptoms that may suggest an active cardiopulmonary problem include: cough, chest pain, breathing difficulties or shortness of breath, dizziness, ankle swelling, uncontrolled high or unusually low blood pressure, and/or palpitations. If you are experiencing any of these symptoms, cancel your procedure and contact your primary care physician for an evaluation.  Remember:  Regular Business hours are:  Monday to Thursday 8:00 AM to 4:00 PM  Provider's Schedule: Francisco Naveira, MD:  Procedure days: Tuesday and Thursday 7:30 AM to 4:00 PM  Bilal Lateef, MD:  Procedure days: Monday and Wednesday 7:30 AM to 4:00 PM  ______________________________________________________________________   Epidural Steroid Injection Patient Information  Description: The epidural space surrounds the nerves as they exit the spinal cord.  In some patients, the nerves can be  compressed and inflamed by a bulging disc or a tight spinal canal (spinal stenosis).  By injecting steroids into the epidural space, we can bring irritated nerves into direct contact with a potentially helpful medication.  These steroids act directly on the irritated nerves and can reduce swelling and inflammation which often leads to decreased pain.  Epidural steroids may be injected anywhere along the spine and from the neck to the low back depending upon the location of your pain.   After numbing the skin with local anesthetic (like Novocaine), a small needle is passed into the epidural space slowly.  You may experience a sensation of pressure while this is being done.  The entire block usually last less than 10 minutes.  Conditions which may be treated by epidural steroids:  Low back and leg pain Neck and arm pain Spinal stenosis Post-laminectomy syndrome Herpes zoster (shingles) pain Pain from compression fractures  Preparation for the injection:  Do not eat any solid food or dairy products within 8 hours of your appointment.  You may drink clear liquids up to 3 hours before appointment.  Clear liquids include water, black coffee, juice or soda.  No milk or cream please. You may take your regular medication, including pain medications, with a sip of water before your appointment  Diabetics should hold regular insulin (if taken separately) and take 1/2 normal NPH dos the morning of the procedure.  Carry some sugar containing items with you to your appointment. A driver must accompany you and be prepared to drive you home after your procedure.  Bring all your current medications with your. An IV may be inserted and   sedation may be given at the discretion of the physician.   A blood pressure cuff, EKG and other monitors will often be applied during the procedure.  Some patients may need to have extra oxygen administered for a short period. You will be asked to provide medical information,  including your allergies, prior to the procedure.  We must know immediately if you are taking blood thinners (like Coumadin/Warfarin)  Or if you are allergic to IV iodine contrast (dye). We must know if you could possible be pregnant.  Possible side-effects: Bleeding from needle site Infection (rare, may require surgery) Nerve injury (rare) Numbness & tingling (temporary) Difficulty urinating (rare, temporary) Spinal headache ( a headache worse with upright posture) Light -headedness (temporary) Pain at injection site (several days) Decreased blood pressure (temporary) Weakness in arm/leg (temporary) Pressure sensation in back/neck (temporary)  Call if you experience: Fever/chills associated with headache or increased back/neck pain. Headache worsened by an upright position. New onset weakness or numbness of an extremity below the injection site Hives or difficulty breathing (go to the emergency room) Inflammation or drainage at the infection site Severe back/neck pain Any new symptoms which are concerning to you  Please note:  Although the local anesthetic injected can often make your back or neck feel good for several hours after the injection, the pain will likely return.  It takes 3-7 days for steroids to work in the epidural space.  You may not notice any pain relief for at least that one week.  If effective, we will often do a series of three injections spaced 3-6 weeks apart to maximally decrease your pain.  After the initial series, we generally will wait several months before considering a repeat injection of the same type.  If you have any questions, please call [phone]  [facility] 

## 2022-08-15 ENCOUNTER — Encounter: Payer: Self-pay | Admitting: Student in an Organized Health Care Education/Training Program

## 2022-08-15 ENCOUNTER — Ambulatory Visit
Admission: RE | Admit: 2022-08-15 | Discharge: 2022-08-15 | Disposition: A | Discharge status: Home / Self Care | Payer: Medicare Other | Source: Ambulatory Visit | Attending: Student in an Organized Health Care Education/Training Program | Admitting: Student in an Organized Health Care Education/Training Program

## 2022-08-15 ENCOUNTER — Ambulatory Visit
Payer: Medicare Other | Attending: Student in an Organized Health Care Education/Training Program | Admitting: Student in an Organized Health Care Education/Training Program

## 2022-08-15 VITALS — BP 161/92 | HR 70 | Temp 97.3°F | Resp 18 | Ht 63.0 in | Wt 151.0 lb

## 2022-08-15 DIAGNOSIS — G894 Chronic pain syndrome: Secondary | ICD-10-CM | POA: Diagnosis present

## 2022-08-15 DIAGNOSIS — G8929 Other chronic pain: Secondary | ICD-10-CM | POA: Insufficient documentation

## 2022-08-15 DIAGNOSIS — M5416 Radiculopathy, lumbar region: Secondary | ICD-10-CM | POA: Diagnosis present

## 2022-08-15 DIAGNOSIS — M48062 Spinal stenosis, lumbar region with neurogenic claudication: Secondary | ICD-10-CM | POA: Diagnosis present

## 2022-08-15 MED ORDER — DIAZEPAM 5 MG PO TABS
5.0000 mg | ORAL_TABLET | ORAL | Status: AC
  Administered 2022-08-15: 5 mg via ORAL

## 2022-08-15 MED ORDER — ROPIVACAINE HCL 2 MG/ML IJ SOLN
INTRAMUSCULAR | Status: AC
  Filled 2022-08-15: qty 20

## 2022-08-15 MED ORDER — SODIUM CHLORIDE 0.9% FLUSH
2.0000 mL | Freq: Once | INTRAVENOUS | Status: AC
  Administered 2022-08-15: 2 mL

## 2022-08-15 MED ORDER — DIAZEPAM 5 MG PO TABS
ORAL_TABLET | ORAL | Status: AC
  Filled 2022-08-15: qty 1

## 2022-08-15 MED ORDER — ROPIVACAINE HCL 2 MG/ML IJ SOLN
2.0000 mL | Freq: Once | INTRAMUSCULAR | Status: AC
  Administered 2022-08-15: 2 mL via EPIDURAL

## 2022-08-15 MED ORDER — IOHEXOL 180 MG/ML  SOLN
10.0000 mL | Freq: Once | INTRAMUSCULAR | Status: AC
  Administered 2022-08-15: 10 mL via EPIDURAL
  Filled 2022-08-15: qty 20

## 2022-08-15 MED ORDER — LIDOCAINE HCL 2 % IJ SOLN
20.0000 mL | Freq: Once | INTRAMUSCULAR | Status: AC
  Administered 2022-08-15: 200 mg

## 2022-08-15 MED ORDER — DEXAMETHASONE SODIUM PHOSPHATE 10 MG/ML IJ SOLN
INTRAMUSCULAR | Status: AC
  Filled 2022-08-15: qty 1

## 2022-08-15 MED ORDER — LIDOCAINE HCL (PF) 2 % IJ SOLN
INTRAMUSCULAR | Status: AC
  Filled 2022-08-15: qty 10

## 2022-08-15 MED ORDER — SODIUM CHLORIDE (PF) 0.9 % IJ SOLN
INTRAMUSCULAR | Status: AC
  Filled 2022-08-15: qty 10

## 2022-08-15 MED ORDER — DEXAMETHASONE SODIUM PHOSPHATE 10 MG/ML IJ SOLN
10.0000 mg | Freq: Once | INTRAMUSCULAR | Status: AC
  Administered 2022-08-15: 10 mg

## 2022-08-15 NOTE — Patient Instructions (Signed)

## 2022-08-15 NOTE — Progress Notes (Signed)
Safety precautions to be maintained throughout the outpatient stay will include: orient to surroundings, keep bed in low position, maintain call bell within reach at all times, provide assistance with transfer out of bed and ambulation.  

## 2022-08-15 NOTE — Progress Notes (Signed)
PROVIDER NOTE: Interpretation of information contained herein should be left to medically-trained personnel. Specific patient instructions are provided elsewhere under "Patient Instructions" section of medical record. This document was created in part using STT-dictation technology, any transcriptional errors that may result from this process are unintentional.  Patient: Eileen Maldonado Type: Established DOB: [birth year] MRN: [id number] PCP: Jerl Mina, MD  Service: Procedure DOS: 08/15/2022 Setting: Ambulatory Location: Ambulatory outpatient facility Delivery: Face-to-face Provider: Edward Jolly, MD Specialty: Interventional Pain Management Specialty designation: 09 Location: Outpatient facility Ref. Prov.: Jerl Mina, MD    Primary Reason for Visit: Interventional Pain Management Treatment. CC: Back Pain (lower)   Procedure:           Type: Lumbar epidural steroid injection (LESI) (interlaminar) #2    Laterality: Midline   Level:  L4-5 Level.  Imaging: Fluoroscopic guidance         Anesthesia: Local anesthesia (1-2% Lidocaine) Anxiolysis: Oral Valium 5 mg DOS: 08/15/2022  Performed by: Edward Jolly, MD  Purpose: Diagnostic/Therapeutic Indications: Lumbar radicular pain of intraspinal etiology of more than 4 weeks that has failed to respond to conservative therapy and is severe enough to impact quality of life or function. 1. Spinal stenosis, lumbar region, with neurogenic claudication   2. Chronic radicular lumbar pain   3. Chronic pain syndrome     NAS-11 Pain score:   Pre-procedure: 8 /10   Post-procedure: 6 /10      Position / Prep / Materials:  Position: Prone w/ head of the table raised (slight reverse trendelenburg) to facilitate breathing.  Prep solution: DuraPrep (Iodine Povacrylex [0.7% available iodine] and Isopropyl Alcohol, 74% w/w) Prep Area: Entire Posterior Lumbar Region from lower scapular tip down to mid buttocks area and from flank to  flank. Materials:  Tray: Epidural tray Needle(s):  Type: Epidural needle (Tuohy) Gauge (G):  22 Length: Regular (3.5-in) Qty: 1  Pre-op H&P Assessment:  Eileen Maldonado is a 77 y.o. (year old), female patient, seen today for interventional treatment. She  has a past surgical history that includes appy; breast biopsy; hernia repair; Hernia repair; Knee Arthroplasty (Left, 07/06/2016); Breast surgery (Left); Appendectomy; Hysteroscopy with D & C (N/A, 12/22/2017); and Colonoscopy (N/A, 04/13/2022). Eileen Maldonado has a current medication list which includes the following prescription(s): abatacept, albuterol, butenafine hcl, vitamin d3, citalopram, conjugated estrogens, dextran 70-hypromellose, diclofenac, fluticasone, fluticasone furoate-vilanterol, hydrochlorothiazide, leflunomide, loperamide, and pregabalin. Her primarily concern today is the Back Pain (lower)  Initial Vital Signs:  Pulse/HCG Rate: 70ECG Heart Rate: 66 Temp:  (!) 97.3 F (36.3 C) Resp: 13 BP:  (!) 143/85 SpO2: 95 %  BMI: Estimated body mass index is 26.75 kg/m as calculated from the following:   Height as of this encounter: 5\' 3"  (1.6 m).   Weight as of this encounter: 151 lb (68.5 kg).  Risk Assessment: Allergies: Reviewed. She is allergic to amlodipine, lisinopril, celebrex [celecoxib], methotrexate derivatives, bupropion, doxycycline, etanercept, and latex.  Allergy Precautions: None required Coagulopathies: Reviewed. None identified.  Blood-thinner therapy: None at this time Active Infection(s): Reviewed. None identified. Eileen Maldonado is afebrile  Site Confirmation: Eileen Maldonado was asked to confirm the procedure and laterality before marking the site Procedure checklist: Completed Consent: Before the procedure and under the influence of no sedative(s), amnesic(s), or anxiolytics, the patient was informed of the treatment options, risks and possible complications. To fulfill our ethical and legal obligations, as recommended  by the American Medical Association's Code of Ethics, I have informed the patient of my clinical impression; the  nature and purpose of the treatment or procedure; the risks, benefits, and possible complications of the intervention; the alternatives, including doing nothing; the risk(s) and benefit(s) of the alternative treatment(s) or procedure(s); and the risk(s) and benefit(s) of doing nothing. The patient was provided information about the general risks and possible complications associated with the procedure. These may include, but are not limited to: failure to achieve desired goals, infection, bleeding, organ or nerve damage, allergic reactions, paralysis, and death. In addition, the patient was informed of those risks and complications associated to Spine-related procedures, such as failure to decrease pain; infection (i.e.: Meningitis, epidural or intraspinal abscess); bleeding (i.e.: epidural hematoma, subarachnoid hemorrhage, or any other type of intraspinal or peri-dural bleeding); organ or nerve damage (i.e.: Any type of peripheral nerve, nerve root, or spinal cord injury) with subsequent damage to sensory, motor, and/or autonomic systems, resulting in permanent pain, numbness, and/or weakness of one or several areas of the body; allergic reactions; (i.e.: anaphylactic reaction); and/or death. Furthermore, the patient was informed of those risks and complications associated with the medications. These include, but are not limited to: allergic reactions (i.e.: anaphylactic or anaphylactoid reaction(s)); adrenal axis suppression; blood sugar elevation that in diabetics may result in ketoacidosis or comma; water retention that in patients with history of congestive heart failure may result in shortness of breath, pulmonary edema, and decompensation with resultant heart failure; weight gain; swelling or edema; medication-induced neural toxicity; particulate matter embolism and blood vessel occlusion with  resultant organ, and/or nervous system infarction; and/or aseptic necrosis of one or more joints. Finally, the patient was informed that Medicine is not an exact science; therefore, there is also the possibility of unforeseen or unpredictable risks and/or possible complications that may result in a catastrophic outcome. The patient indicated having understood very clearly. We have given the patient no guarantees and we have made no promises. Enough time was given to the patient to ask questions, all of which were answered to the patient's satisfaction. Ms. Reidinger has indicated that she wanted to continue with the procedure. Attestation: I, the ordering provider, attest that I have discussed with the patient the benefits, risks, side-effects, alternatives, likelihood of achieving goals, and potential problems during recovery for the procedure that I have provided informed consent. Date  Time: 08/15/2022 10:14 AM  Pre-Procedure Preparation:  Monitoring: As per clinic protocol. Respiration, ETCO2, SpO2, BP, heart rate and rhythm monitor placed and checked for adequate function Safety Precautions: Patient was assessed for positional comfort and pressure points before starting the procedure. Time-out: I initiated and conducted the "Time-out" before starting the procedure, as per protocol. The patient was asked to participate by confirming the accuracy of the "Time Out" information. Verification of the correct person, site, and procedure were performed and confirmed by me, the nursing staff, and the patient. "Time-out" conducted as per Joint Commission's Universal Protocol (UP.01.01.01). Time: 1055  Description/Narrative of Procedure:          Target: Epidural space via interlaminar opening, initially targeting the lower laminar border of the superior vertebral body. Region: Lumbar Approach: Percutaneous paravertebral  Rationale (medical necessity): procedure needed and proper for the diagnosis and/or  treatment of the patient's medical symptoms and needs. Procedural Technique Safety Precautions: Aspiration looking for blood return was conducted prior to all injections. At no point did we inject any substances, as a needle was being advanced. No attempts were made at seeking any paresthesias. Safe injection practices and needle disposal techniques used. Medications properly checked for expiration  dates. SDV (single dose vial) medications used. Description of the Procedure: Protocol guidelines were followed. The procedure needle was introduced through the skin, ipsilateral to the reported pain, and advanced to the target area. Bone was contacted and the needle walked caudad, until the lamina was cleared. The epidural space was identified using "loss-of-resistance technique" with 2-3 ml of PF-NaCl (0.9% NSS), in a 5cc LOR glass syringe.  6 cc solution made of 3 cc of preservative-free saline, 2 cc of 0.2% ropivacaine, 1 cc of Decadron 10 mg/cc.   Vitals:   08/15/22 1015 08/15/22 1054 08/15/22 1058 08/15/22 1059  BP: (!) 143/85 (!) 155/92 (!) 168/97 (!) 161/92  Pulse: 70     Resp:  13 16 18   Temp: (!) 97.3 F (36.3 C)     TempSrc: Temporal     SpO2: 95% 97% 99% 97%  Weight: 151 lb (68.5 kg)     Height: 5\' 3"  (1.6 m)       Start Time: 1055 hrs. End Time: 1059 hrs.  Imaging Guidance (Spinal):          Type of Imaging Technique: Fluoroscopy Guidance (Spinal) Indication(s): Assistance in needle guidance and placement for procedures requiring needle placement in or near specific anatomical locations not easily accessible without such assistance. Exposure Time: Please see nurses notes. Contrast: Before injecting any contrast, we confirmed that the patient did not have an allergy to iodine, shellfish, or radiological contrast. Once satisfactory needle placement was completed at the desired level, radiological contrast was injected. Contrast injected under live fluoroscopy. No contrast  complications. See chart for type and volume of contrast used. Fluoroscopic Guidance: I was personally present during the use of fluoroscopy. "Tunnel Vision Technique" used to obtain the best possible view of the target area. Parallax error corrected before commencing the procedure. "Direction-depth-direction" technique used to introduce the needle under continuous pulsed fluoroscopy. Once target was reached, antero-posterior, oblique, and lateral fluoroscopic projection used confirm needle placement in all planes. Images permanently stored in EMR. Interpretation: I personally interpreted the imaging intraoperatively. Adequate needle placement confirmed in multiple planes. Appropriate spread of contrast into desired area was observed. No evidence of afferent or efferent intravascular uptake. No intrathecal or subarachnoid spread observed. Permanent images saved into the patient's record.   Post-operative Assessment:  Post-procedure Vital Signs:  Pulse/HCG Rate: 7067 Temp:  (!) 97.3 F (36.3 C) Resp: 18 BP:  (!) 161/92 SpO2: 97 %  EBL: None  Complications: No immediate post-treatment complications observed by team, or reported by patient.  Note: The patient tolerated the entire procedure well. A repeat set of vitals were taken after the procedure and the patient was kept under observation following institutional policy, for this type of procedure. Post-procedural neurological assessment was performed, showing return to baseline, prior to discharge. The patient was provided with post-procedure discharge instructions, including a section on how to identify potential problems. Should any problems arise concerning this procedure, the patient was given instructions to immediately contact us, at any time, without hesitation. In any case, we plan to contact the patient by telephone for a follow-up status report regarding this interventional procedure.  Comments:  No additional relevant  information.  5 out of 5 strength bilateral lower extremity: Plantar flexion, dorsiflexion, knee flexion, knee extension.   Plan of Care  Orders:  Orders Placed This Encounter  Procedures   DG PAIN CLINIC C-ARM 1-60 MIN NO REPORT    Intraoperative interpretation by procedural physician at [facility].    Standing  Status:   Standing    Number of Occurrences:   1    Order Specific Question:   Reason for exam:    Answer:   Assistance in needle guidance and placement for procedures requiring needle placement in or near specific anatomical locations not easily accessible without such assistance.    Medications ordered for procedure: Meds ordered this encounter  Medications   iohexol (OMNIPAQUE) 180 MG/ML injection 10 mL    Must be Myelogram-compatible. If not available, you may substitute with a water-soluble, non-ionic, hypoallergenic, myelogram-compatible radiological contrast medium.   lidocaine (XYLOCAINE) 2 % (with pres) injection 400 mg   diazepam (VALIUM) tablet 5 mg    Make sure Flumazenil is available in the pyxis when using this medication. If oversedation occurs, administer 0.2 mg IV over 15 sec. If after 45 sec no response, administer 0.2 mg again over 1 min; may repeat at 1 min intervals; not to exceed 4 doses (1 mg)   sodium chloride flush (NS) 0.9 % injection 2 mL   ropivacaine (PF) 2 mg/mL (0.2%) (NAROPIN) injection 2 mL   dexamethasone (DECADRON) injection 10 mg   Medications administered: We administered iohexol, lidocaine, diazepam, sodium chloride flush, ropivacaine (PF) 2 mg/mL (0.2%), and dexamethasone.  See the medical record for exact dosing, route, and time of administration.  Follow-up plan:   Return in about 5 weeks (around 09/19/2022) for Post Procedure Evaluation, in person.      L4-L5 ESI 12/13/2021, 08/15/22   Recent Visits Date Type Provider Dept  07/26/22 Office Visit Edward Jolly, MD [facility]  Showing recent visits within past  90 days and meeting all other requirements Today's Visits Date Type Provider Dept  08/15/22 Procedure visit Edward Jolly, MD [facility]  Showing today's visits and meeting all other requirements Future Appointments Date Type Provider Dept  09/13/22 Appointment Edward Jolly, MD [facility]  Showing future appointments within next 90 days and meeting all other requirements  Disposition: Discharge home  Discharge (Date  Time): 08/15/2022; 1108 hrs.   Primary Care Physician: Jerl Mina, MD Location: [facility] Note by: Edward Jolly, MD Date: 08/15/2022; Time: 11:11 AM  Disclaimer:  Medicine is not an exact science. The only guarantee in medicine is that nothing is guaranteed. It is important to note that the decision to proceed with this intervention was based on the information collected from the patient. The Data and conclusions were drawn from the patient's questionnaire, the interview, and the physical examination. Because the information was provided in large part by the patient, it cannot be guaranteed that it has not been purposely or unconsciously manipulated. Every effort has been made to obtain as much relevant data as possible for this evaluation. It is important to note that the conclusions that lead to this procedure are derived in large part from the available data. Always take into account that the treatment will also be dependent on availability of resources and existing treatment guidelines, considered by other Pain Management Practitioners as being common knowledge and practice, at the time of the intervention. For Medico-Legal purposes, it is also important to point out that variation in procedural techniques and pharmacological choices are the acceptable norm. The indications, contraindications, technique, and results of the above procedure should only be interpreted and judged by a Board-Certified Interventional Pain  Specialist with extensive familiarity and expertise in the same exact procedure and technique.

## 2022-08-16 ENCOUNTER — Telehealth: Payer: Self-pay

## 2022-08-16 NOTE — Telephone Encounter (Signed)
Post procedure follow up.  Patient states she is doing good.  

## 2022-09-13 ENCOUNTER — Encounter: Payer: Self-pay | Admitting: Student in an Organized Health Care Education/Training Program

## 2022-09-13 ENCOUNTER — Ambulatory Visit
Payer: Medicare Other | Attending: Student in an Organized Health Care Education/Training Program | Admitting: Student in an Organized Health Care Education/Training Program

## 2022-09-13 ENCOUNTER — Telehealth: Payer: Self-pay

## 2022-09-13 VITALS — BP 150/96 | HR 77 | Temp 97.4°F | Ht 63.0 in | Wt 151.0 lb

## 2022-09-13 DIAGNOSIS — G8929 Other chronic pain: Secondary | ICD-10-CM | POA: Diagnosis present

## 2022-09-13 DIAGNOSIS — M5416 Radiculopathy, lumbar region: Secondary | ICD-10-CM | POA: Insufficient documentation

## 2022-09-13 DIAGNOSIS — M48062 Spinal stenosis, lumbar region with neurogenic claudication: Secondary | ICD-10-CM | POA: Diagnosis present

## 2022-09-13 DIAGNOSIS — G894 Chronic pain syndrome: Secondary | ICD-10-CM | POA: Diagnosis present

## 2022-09-13 DIAGNOSIS — M47816 Spondylosis without myelopathy or radiculopathy, lumbar region: Secondary | ICD-10-CM

## 2022-09-13 MED ORDER — TIZANIDINE HCL 2 MG PO TABS: 2.0000 mg | ORAL_TABLET | Freq: Every evening | ORAL | 0 refills | Status: AC | PRN

## 2022-09-13 NOTE — Patient Instructions (Addendum)
You will have your MRI before next visit with the pain clinic.  GENERAL RISKS AND COMPLICATIONS  What are the risk, side effects and possible complications? Generally speaking, most procedures are safe.  However, with any procedure there are risks, side effects, and the possibility of complications.  The risks and complications are dependent upon the sites that are lesioned, or the type of nerve block to be performed.  The closer the procedure is to the spine, the more serious the risks are.  Great care is taken when placing the radio frequency needles, block needles or lesioning probes, but sometimes complications can occur. Infection: Any time there is an injection through the skin, there is a risk of infection.  This is why sterile conditions are used for these blocks.  There are four possible types of infection. Localized skin infection. Central Nervous System Infection-This can be in the form of Meningitis, which can be deadly. Epidural Infections-This can be in the form of an epidural abscess, which can cause pressure inside of the spine, causing compression of the spinal cord with subsequent paralysis. This would require an emergency surgery to decompress, and there are no guarantees that the patient would recover from the paralysis. Discitis-This is an infection of the intervertebral discs.  It occurs in about 1% of discography procedures.  It is difficult to treat and it may lead to surgery.        2. Pain: the needles have to go through skin and soft tissues, will cause soreness.       3. Damage to internal structures:  The nerves to be lesioned may be near blood vessels or    other nerves which can be potentially damaged.       4. Bleeding: Bleeding is more common if the patient is taking blood thinners such as  aspirin, Coumadin, Ticiid, Plavix, etc., or if he/she have some genetic predisposition  such as hemophilia. Bleeding into the spinal canal can cause compression of the spinal  cord  with subsequent paralysis.  This would require an emergency surgery to  decompress and there are no guarantees that the patient would recover from the  paralysis.       5. Pneumothorax:  Puncturing of a lung is a possibility, every time a needle is introduced in  the area of the chest or upper back.  Pneumothorax refers to free air around the  collapsed lung(s), inside of the thoracic cavity (chest cavity).  Another two possible  complications related to a similar event would include: Hemothorax and Chylothorax.   These are variations of the Pneumothorax, where instead of air around the collapsed  lung(s), you may have blood or chyle, respectively.       6. Spinal headaches: They may occur with any procedures in the area of the spine.       7. Persistent CSF (Cerebro-Spinal Fluid) leakage: This is a rare problem, but may occur  with prolonged intrathecal or epidural catheters either due to the formation of a fistulous  track or a dural tear.       8. Nerve damage: By working so close to the spinal cord, there is always a possibility of  nerve damage, which could be as serious as a permanent spinal cord injury with  paralysis.       9. Death:  Although rare, severe deadly allergic reactions known as "Anaphylactic  reaction" can occur to any of the medications used.      10. Worsening of the  symptoms:  We can always make thing worse.  What are the chances of something like this happening? Chances of any of this occuring are extremely low.  By statistics, you have more of a chance of getting killed in a motor vehicle accident: while driving to the hospital than any of the above occurring .  Nevertheless, you should be aware that they are possibilities.  In general, it is similar to taking a shower.  Everybody knows that you can slip, hit your head and get killed.  Does that mean that you should not shower again?  Nevertheless always keep in mind that statistics do not mean anything if you happen to be on the  wrong side of them.  Even if a procedure has a 1 (one) in a 1,000,000 (million) chance of going wrong, it you happen to be that one..Also, keep in mind that by statistics, you have more of a chance of having something go wrong when taking medications.  Who should not have this procedure? If you are on a blood thinning medication (e.g. Coumadin, Plavix, see list of "Blood Thinners"), or if you have an active infection going on, you should not have the procedure.  If you are taking any blood thinners, please inform your physician.  How should I prepare for this procedure? Do not eat or drink anything at least six hours prior to the procedure. Bring a driver with you .  It cannot be a taxi. Come accompanied by an adult that can drive you back, and that is strong enough to help you if your legs get weak or numb from the local anesthetic. Take all of your medicines the morning of the procedure with just enough water to swallow them. If you have diabetes, make sure that you are scheduled to have your procedure done first thing in the morning, whenever possible. If you have diabetes, take only half of your insulin dose and notify our nurse that you have done so as soon as you arrive at the clinic. If you are diabetic, but only take blood sugar pills (oral hypoglycemic), then do not take them on the morning of your procedure.  You may take them after you have had the procedure. Do not take aspirin or any aspirin-containing medications, at least eleven (11) days prior to the procedure.  They may prolong bleeding. Wear loose fitting clothing that may be easy to take off and that you would not mind if it got stained with Betadine or blood. Do not wear any jewelry or perfume Remove any nail coloring.  It will interfere with some of our monitoring equipment.  NOTE: Remember that this is not meant to be interpreted as a complete list of all possible complications.  Unforeseen problems may occur.  BLOOD  THINNERS The following drugs contain aspirin or other products, which can cause increased bleeding during surgery and should not be taken for 2 weeks prior to and 1 week after surgery.  If you should need take something for relief of minor pain, you may take acetaminophen which is found in Tylenol,m Datril, Anacin-3 and Panadol. It is not blood thinner. The products listed below are.  Do not take any of the products listed below in addition to any listed on your instruction sheet.  A.P.C or A.P.C with Codeine Codeine Phosphate Capsules #3 Ibuprofen Ridaura  ABC compound Congesprin Imuran rimadil  Advil Cope Indocin Robaxisal  Alka-Seltzer Effervescent Pain Reliever and Antacid Coricidin or Coricidin-D  Indomethacin Rufen  Alka-Seltzer plus  Cold Medicine Cosprin Ketoprofen S-A-C Tablets  Anacin Analgesic Tablets or Capsules Coumadin Korlgesic Salflex  Anacin Extra Strength Analgesic tablets or capsules CP-2 Tablets Lanoril Salicylate  Anaprox Cuprimine Capsules Levenox Salocol  Anexsia-D Dalteparin Magan Salsalate  Anodynos Darvon compound Magnesium Salicylate Sine-off  Ansaid Dasin Capsules Magsal Sodium Salicylate  Anturane Depen Capsules Marnal Soma  APF Arthritis pain formula Dewitt's Pills Measurin Stanback  Argesic Dia-Gesic Meclofenamic Sulfinpyrazone  Arthritis Bayer Timed Release Aspirin Diclofenac Meclomen Sulindac  Arthritis pain formula Anacin Dicumarol Medipren Supac  Analgesic (Safety coated) Arthralgen Diffunasal Mefanamic Suprofen  Arthritis Strength Bufferin Dihydrocodeine Mepro Compound Suprol  Arthropan liquid Dopirydamole Methcarbomol with Aspirin Synalgos  ASA tablets/Enseals Disalcid Micrainin Tagament  Ascriptin Doan's Midol Talwin  Ascriptin A/D Dolene Mobidin Tanderil  Ascriptin Extra Strength Dolobid Moblgesic Ticlid  Ascriptin with Codeine Doloprin or Doloprin with Codeine Momentum Tolectin  Asperbuf Duoprin Mono-gesic Trendar  Aspergum Duradyne Motrin or Motrin  IB Triminicin  Aspirin plain, buffered or enteric coated Durasal Myochrisine Trigesic  Aspirin Suppositories Easprin Nalfon Trillsate  Aspirin with Codeine Ecotrin Regular or Extra Strength Naprosyn Uracel  Atromid-S Efficin Naproxen Ursinus  Auranofin Capsules Elmiron Neocylate Vanquish  Axotal Emagrin Norgesic Verin  Azathioprine Empirin or Empirin with Codeine Normiflo Vitamin E  Azolid Emprazil Nuprin Voltaren  Bayer Aspirin plain, buffered or children's or timed BC Tablets or powders Encaprin Orgaran Warfarin Sodium  Buff-a-Comp Enoxaparin Orudis Zorpin  Buff-a-Comp with Codeine Equegesic Os-Cal-Gesic   Buffaprin Excedrin plain, buffered or Extra Strength Oxalid   Bufferin Arthritis Strength Feldene Oxphenbutazone   Bufferin plain or Extra Strength Feldene Capsules Oxycodone with Aspirin   Bufferin with Codeine Fenoprofen Fenoprofen Pabalate or Pabalate-SF   Buffets II Flogesic Panagesic   Buffinol plain or Extra Strength Florinal or Florinal with Codeine Panwarfarin   Buf-Tabs Flurbiprofen Penicillamine   Butalbital Compound Four-way cold tablets Penicillin   Butazolidin Fragmin Pepto-Bismol   Carbenicillin Geminisyn Percodan   Carna Arthritis Reliever Geopen Persantine   Carprofen Gold's salt Persistin   Chloramphenicol Goody's Phenylbutazone   Chloromycetin Haltrain Piroxlcam   Clmetidine heparin Plaquenil   Cllnoril Hyco-pap Ponstel   Clofibrate Hydroxy chloroquine Propoxyphen         Before stopping any of these medications, be sure to consult the physician who ordered them.  Some, such as Coumadin (Warfarin) are ordered to prevent or treat serious conditions such as "deep thrombosis", "pumonary embolisms", and other heart problems.  The amount of time that you may need off of the medication may also vary with the medication and the reason for which you were taking it.  If you are taking any of these medications, please make sure you notify your pain physician before you  undergo any procedures.         Facet Blocks Patient Information  Description: The facets are joints in the spine between the vertebrae.  Like any joints in the body, facets can become irritated and painful.  Arthritis can also effect the facets.  By injecting steroids and local anesthetic in and around these joints, we can temporarily block the nerve supply to them.  Steroids act directly on irritated nerves and tissues to reduce selling and inflammation which often leads to decreased pain.  Facet blocks may be done anywhere along the spine from the neck to the low back depending upon the location of your pain.   After numbing the skin with local anesthetic (like Novocaine), a small needle is passed onto the facet joints under x-ray guidance.  You  may experience a sensation of pressure while this is being done.  The entire block usually lasts about 15-25 minutes.   Conditions which may be treated by facet blocks:  Low back/buttock pain Neck/shoulder pain Certain types of headaches  Preparation for the injection:  Do not eat any solid food or dairy products within 8 hours of your appointment. You may drink clear liquid up to 3 hours before appointment.  Clear liquids include water, black coffee, juice or soda.  No milk or cream please. You may take your regular medication, including pain medications, with a sip of water before your appointment.  Diabetics should hold regular insulin (if taken separately) and take 1/2 normal NPH dose the morning of the procedure.  Carry some sugar containing items with you to your appointment. A driver must accompany you and be prepared to drive you home after your procedure. Bring all your current medications with you. An IV may be inserted and sedation may be given at the discretion of the physician. A blood pressure cuff, EKG and other monitors will often be applied during the procedure.  Some patients may need to have extra oxygen administered for a  short period. You will be asked to provide medical information, including your allergies and medications, prior to the procedure.  We must know immediately if you are taking blood thinners (like Coumadin/Warfarin) or if you are allergic to IV iodine contrast (dye).  We must know if you could possible be pregnant.  Possible side-effects:  Bleeding from needle site Infection (rare, may require surgery) Nerve injury (rare) Numbness & tingling (temporary) Difficulty urinating (rare, temporary) Spinal headache (a headache worse with upright posture) Light-headedness (temporary) Pain at injection site (serveral days) Decreased blood pressure (rare, temporary) Weakness in arm/leg (temporary) Pressure sensation in back/neck (temporary)   Call if you experience:  Fever/chills associated with headache or increased back/neck pain Headache worsened by an upright position New onset, weakness or numbness of an extremity below the injection site Hives or difficulty breathing (go to the emergency room) Inflammation or drainage at the injection site(s) Severe back/neck pain greater than usual New symptoms which are concerning to you  Please note:  Although the local anesthetic injected can often make your back or neck feel good for several hours after the injection, the pain will likely return. It takes 3-7 days for steroids to work.  You may not notice any pain relief for at least one week.  If effective, we will often do a series of 2-3 injections spaced 3-6 weeks apart to maximally decrease your pain.  After the initial series, you may be a candidate for a more permanent nerve block of the facets.  If you have any questions, please call #[phone] [facility]

## 2022-09-13 NOTE — Progress Notes (Signed)
PROVIDER NOTE: Information contained herein reflects review and annotations entered in association with encounter. Interpretation of such information and data should be left to medically-trained personnel. Information provided to patient can be located elsewhere in the medical record under "Patient Instructions". Document created using STT-dictation technology, any transcriptional errors that may result from process are unintentional.    Patient: Eileen Maldonado  Service Category: E/M  Provider: Edward Jolly, MD  DOB: 04-06-1945  DOS: 09/13/2022  Referring Provider: Jerl Mina, MD  MRN: [id number]  Specialty: Interventional Pain Management  PCP: Jerl Mina, MD  Type: Established Patient  Setting: Ambulatory outpatient    Location: Office  Delivery: Face-to-face     HPI  Eileen Maldonado, a 77 y.o. 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 Eileen Maldonado's primary complain today is Back Pain (lower)  Pertinent problems: Eileen Maldonado has Rheumatoid arthritis (HCC); S/P total knee arthroplasty; Chronic bilateral low back pain with bilateral sciatica; Spinal stenosis, lumbar region, with neurogenic claudication; Chronic radicular lumbar pain; and Lumbar spondylosis on their pertinent problem list. Pain Assessment: Severity of Chronic pain is reported as a 5 /10. Location: Back Lower/radiates through buttocks. Onset: More than a month ago. Quality: Constant, Aching. Timing: Constant. Modifying factor(s): meds some. Vitals:  height is 5\' 3"  (1.6 m) and weight is 151 lb (68.5 kg). Her temporal temperature is 97.4 F (36.3 C) (abnormal). Her blood pressure is 150/96 (abnormal) and her pulse is 77. Her oxygen saturation is 97%.  BMI: Estimated body mass index is 26.75 kg/m as calculated from the following:   Height as of this encounter: 5\' 3"  (1.6 m).   Weight as of this encounter: 151 lb (68.5 kg). Last encounter:  07/26/2022. Last procedure: 08/15/2022.  Reason for encounter: post-procedure evaluation and assessment.   Persistent and severe low back pain that radiates into right greater than left buttock.Marland Kitchen  Unfortunately previous lumbar epidural steroid injection was not very helpful.  She does have physical exam findings consistent with lumbar facet joint syndrome.   ROS  Constitutional: Denies any fever or chills Gastrointestinal: No reported hemesis, hematochezia, vomiting, or acute GI distress Musculoskeletal:  + low back pain Neurological: No reported episodes of acute onset apraxia, aphasia, dysarthria, agnosia, amnesia, paralysis, loss of coordination, or loss of consciousness  Medication Review  Abatacept, Butenafine HCl, Dextran 70-Hypromellose, Vitamin D3, albuterol, citalopram, conjugated estrogens, diclofenac, fluticasone, fluticasone furoate-vilanterol, hydrochlorothiazide, leflunomide, loperamide, pregabalin, and tiZANidine  History Review  Allergy: Eileen Maldonado is allergic to amlodipine, lisinopril, celebrex [celecoxib], methotrexate derivatives, bupropion, doxycycline, etanercept, and latex. Drug: Eileen Maldonado  reports no history of drug use. Alcohol:  reports no history of alcohol use. Tobacco:  reports that she has never smoked. She has never used smokeless tobacco. Social: Eileen Maldonado  reports that she has never smoked. She has never used smokeless tobacco. She reports that she does not drink alcohol and does not use drugs. Medical:  has a past medical history of Breast cancer (HCC), Collagen vascular disease (HCC), Hives, Hypertension, ILD (interstitial lung disease) (HCC), Pneumonia, PVD (peripheral vascular disease) (HCC), RA (rheumatoid arthritis) (HCC), Raynaud's phenomenon, and Right thyroid nodule. Surgical: Eileen Maldonado  has a past surgical history that includes appy; breast biopsy; hernia repair; Hernia repair; Knee Arthroplasty (Left, 07/06/2016); Breast surgery (Left); Appendectomy;  Hysteroscopy with D & C (N/A, 12/22/2017); and Colonoscopy (N/A, 04/13/2022). Family: family history includes Breast cancer in her mother; Hypertension in her father and mother; Stroke  in her father.  Laboratory Chemistry Profile   Renal Lab Results  Component Value Date   BUN 19 12/19/2017   CREATININE 1.06 (H) 12/19/2017   GFRAA 59 (L) 12/19/2017   GFRNONAA 51 (L) 12/19/2017    Hepatic Lab Results  Component Value Date   AST 25 06/22/2016   ALT 21 06/22/2016   ALBUMIN 4.1 06/22/2016   ALKPHOS 76 06/22/2016   LIPASE 22 01/31/2016    Electrolytes Lab Results  Component Value Date   NA 138 12/22/2017   K 3.3 (L) 12/22/2017   CL 104 12/19/2017   CALCIUM 9.1 12/19/2017    Bone No results found for: "VD25OH", "VD125OH2TOT", "WU9811BJ4", "NW2956OZ3", "25OHVITD1", "25OHVITD2", "25OHVITD3", "TESTOFREE", "TESTOSTERONE"  Inflammation (CRP: Acute Phase) (ESR: Chronic Phase) Lab Results  Component Value Date   CRP <0.8 06/22/2016   ESRSEDRATE 11 06/22/2016   LATICACIDVEN 2.3 (HH) 01/31/2016         Note: Above Lab results reviewed.  Recent Imaging Review   Narrative & Impression  CLINICAL DATA:  Bilateral low back, buttock and leg pain.   EXAM: MRI LUMBAR SPINE WITHOUT CONTRAST   TECHNIQUE: Multiplanar, multisequence MR imaging of the lumbar spine was performed. No intravenous contrast was administered.   COMPARISON:  None.   FINDINGS: Segmentation:  5 lumbar type vertebral bodies.   Alignment:  Normal   Vertebrae:  No fracture or primary lesion.   Conus medullaris: Extends to the L1-2 level and appears normal.   Paraspinal and other soft tissues: Negative   Disc levels:   Minimal non-compressive disc bulges at T11-12 and T12-L1.   L1-2 and L2-3 are normal.   L3-4: Mild bulging of the disc. Mild facet and ligamentous hypertrophy. No compressive stenosis.   L4-5: Disc degeneration with broad-based herniation of slightly more prominent towards the right.  Bilateral facet and ligamentous hypertrophy. Fluid in the facet joints. Severe spinal stenosis. This could worsen with standing or flexion.   L5-S1: Shallow broad-based disc herniation. Facet and ligamentous hypertrophy with fluid in the joints. Stenosis of the lateral recesses that could worsen with standing or flexion. Neural compression could occur.   IMPRESSION: Severe multifactorial spinal stenosis at L4-5. This could worsen with standing or flexion.   Bilateral lateral recess stenosis at L5-S1. This could worsen with standing or flexion.   Non-compressive disc bulge and mild facet hypertrophy at L3-4.     Electronically Signed   By: Paulina Fusi M.D.   On: 05/04/2016 10:50      Physical Exam  General appearance: Well nourished, well developed, and well hydrated. In no apparent acute distress Mental status: Alert, oriented x 3 (person, place, & time)       Respiratory: No evidence of acute respiratory distress Eyes: PERLA Vitals: BP (!) 150/96   Pulse 77   Temp (!) 97.4 F (36.3 C) (Temporal)   Ht 5\' 3"  (1.6 m)   Wt 151 lb (68.5 kg)   SpO2 97%   BMI 26.75 kg/m  BMI: Estimated body mass index is 26.75 kg/m as calculated from the following:   Height as of this encounter: 5\' 3"  (1.6 m).   Weight as of this encounter: 151 lb (68.5 kg). Ideal: Ideal body weight: 52.4 kg (115 lb 8.3 oz) Adjusted ideal body weight: 58.8 kg (129 lb 11.4 oz)  Lumbar Spine Area Exam  Skin & Axial Inspection: No masses, redness, or swelling Alignment: Symmetrical Functional ROM: Pain restricted ROM       Stability: No instability detected  Muscle Tone/Strength: Functionally intact. No obvious neuro-muscular anomalies detected. Sensory (Neurological): Neurogenic pain pattern and facet mediated Palpation: No palpable anomalies       Positive pain with lumbar extension and facet loading   Gait & Posture Assessment  Ambulation: Limited Gait: Antalgic gait (limping) Posture: Difficulty  standing up straight, due to pain   Lower Extremity Exam      Side: Right lower extremity   Side: Left lower extremity  Stability: No instability observed           Stability: No instability observed          Skin & Extremity Inspection: Skin color, temperature, and hair growth are WNL. No peripheral edema or cyanosis. No masses, redness, swelling, asymmetry, or associated skin lesions. No contractures.   Skin & Extremity Inspection: Skin color, temperature, and hair growth are WNL. No peripheral edema or cyanosis. No masses, redness, swelling, asymmetry, or associated skin lesions. No contractures.  Functional ROM: Pain restricted ROM for hip and knee joints           Functional ROM: Pain restricted ROM for hip and knee joints          Muscle Tone/Strength: Functionally intact. No obvious neuro-muscular anomalies detected.   Muscle Tone/Strength: Functionally intact. No obvious neuro-muscular anomalies detected.  Sensory (Neurological): Neurogenic pain pattern         Sensory (Neurological): Neurogenic pain pattern        DTR: Patellar: deferred today Achilles: deferred today Plantar: deferred today   DTR: Patellar: deferred today Achilles: deferred today Plantar: deferred today  Palpation: No palpable anomalies   Palpation: No palpable anomalies     Assessment   Diagnosis Status  1. Spinal stenosis, lumbar region, with neurogenic claudication   2. Chronic radicular lumbar pain   3. Lumbar facet arthropathy   4. Lumbar spondylosis   5. Chronic pain syndrome    Having a Flare-up Having a Flare-up Having a Flare-up     Plan of Care  Buffie R Masaki has a history of greater than 3 months of moderate to severe pain which is resulted in functional impairment.  The patient has tried various conservative therapeutic options such as NSAIDs, Tylenol, muscle relaxants, physical therapy which was inadequately effective.  Patient's pain is predominantly axial with physical exam and  L-MRI findings suggestive of facet arthropathy. Lumbar facet medial branch nerve blocks were discussed with the patient.  Risks and benefits were reviewed.  Patient would like to proceed with bilateral L3, L4, L5 medial branch nerve block.   Pharmacotherapy (Medications Ordered): Meds ordered this encounter  Medications   tiZANidine (ZANAFLEX) 2 MG tablet    Sig: Take 1 tablet (2 mg total) by mouth at bedtime as needed for muscle spasms.    Dispense:  90 tablet    Refill:  0    Do not place this medication, or any other prescription from our practice, on "Automatic Refill". Patient may have prescription filled one day early if pharmacy is closed on scheduled refill date.   Orders:  Orders Placed This Encounter  Procedures   LUMBAR FACET(MEDIAL BRANCH NERVE BLOCK) MBNB    Standing Status:   Future    Standing Expiration Date:   12/14/2022    Scheduling Instructions:     Procedure: Lumbar facet block (AKA.: Lumbosacral medial branch nerve block)     Side: Bilateral     Level: L3-4, L4-5,Facets (L3, L4, L5, Medial Branch)     Sedation: without  Timeframe: ASAA    Order Specific Question:   Where will this procedure be performed?    Answer:   [facility]   MR LUMBAR SPINE WO CONTRAST    Patient presents with axial pain with possible radicular component. Please assist [place] in identifying specific level(s) and laterality of any additional findings such as: 1. Facet (Zygapophyseal) joint DJD (Hypertrophy, space narrowing, subchondral sclerosis, and/or osteophyte formation) 2. DDD and/or IVDD (Loss of disc height, desiccation, gas patterns, osteophytes, endplate sclerosis, or "Black disc disease") 3. Pars defects 4. Spondylolisthesis, spondylosis, and/or spondyloarthropathies (include Degree/Grade of displacement in mm) (stability) 5. Vertebral body Fractures (acute/chronic) (state percentage of collapse) 6. Demineralization (osteopenia/osteoporotic) 7. Bone pathology 8. Foraminal  narrowing  9. Surgical changes 10. Central, Lateral Recess, and/or Foraminal Stenosis (include AP diameter of stenosis in mm) 11. Surgical changes (hardware type, status, and presence of fibrosis) 12. Modic Type Changes (MRI only) 13. IVDD (Disc bulge, protrusion, herniation, extrusion) (Level, laterality, extent)    Standing Status:   Future    Standing Expiration Date:   10/13/2022    Scheduling Instructions:     Please make sure that the patient understands that this needs to be done as soon as possible. Never have the patient do the imaging "just before the next appointment". Inform patient that having the imaging done within the [facility] will expedite the availability of the results and will provide      imaging availability to the requesting physician. In addition inform the patient that the imaging order has an expiration date and will not be renewed if not done within the active period.    Order Specific Question:   What is the patient's sedation requirement?    Answer:   No Sedation    Order Specific Question:   Does the patient have a pacemaker or implanted devices?    Answer:   No    Order Specific Question:   Preferred imaging location?    Answer:   [facility] (table limit-350lbs)    Order Specific Question:   Call Results- Best Contact Number?    Answer:   [phone] [facility])    Order Specific Question:   Radiology Contrast Protocol - do NOT remove file path    Answer:   [email or web address]   Follow-up plan:   Return in about 22 days (around 10/05/2022) for B/L L3, 4, 5 MBNB , in clinic (PO Valium).      L4-L5 ESI 12/13/2021, 08/15/22     Recent Visits Date Type Provider Dept  08/15/22 Procedure visit Edward Jolly, MD [facility]  07/26/22 Office Visit Edward Jolly, MD [facility]  Showing recent visits within past 90 days and meeting all other requirements Today's Visits Date Type Provider Dept   09/13/22 Office Visit Edward Jolly, MD [facility]  Showing today's visits and meeting all other requirements Future Appointments No visits were found meeting these conditions. Showing future appointments within next 90 days and meeting all other requirements  I discussed the assessment and treatment plan with the patient. The patient was provided an opportunity to ask questions and all were answered. The patient agreed with the plan and demonstrated an understanding of the instructions.  Patient advised to call back or seek an in-person evaluation if the symptoms or condition worsens.  Duration of encounter: .  Total time on encounter, as per AMA guidelines included both the face-to-face and non-face-to-face time personally spent by the physician and/or other qualified  health care professional(s) on the day of the encounter (includes time in activities that require the physician or other qualified health care professional and does not include time in activities normally performed by clinical staff). Physician's time may include the following activities when performed: Preparing to see the patient (e.g., pre-charting review of records, searching for previously ordered imaging, lab work, and nerve conduction tests) Review of prior analgesic pharmacotherapies. Reviewing PMP Interpreting ordered tests (e.g., lab work, imaging, nerve conduction tests) Performing post-procedure evaluations, including interpretation of diagnostic procedures Obtaining and/or reviewing separately obtained history Performing a medically appropriate examination and/or evaluation Counseling and educating the patient/family/caregiver Ordering medications, tests, or procedures Referring and communicating with other health care professionals (when not separately reported) Documenting clinical information in the electronic or other health record Independently interpreting results (not separately  reported) and communicating results to the patient/ family/caregiver Care coordination (not separately reported)  Note by: Edward Jolly, MD Date: 09/13/2022; Time: 2:41 PM

## 2022-09-13 NOTE — Progress Notes (Signed)
Safety precautions to be maintained throughout the outpatient stay will include: orient to surroundings, keep bed in low position, maintain call bell within reach at all times, provide assistance with transfer out of bed and ambulation.  

## 2022-09-15 ENCOUNTER — Encounter: Payer: Medicare Other | Admitting: Student in an Organized Health Care Education/Training Program

## 2022-09-21 ENCOUNTER — Ambulatory Visit
Admission: RE | Admit: 2022-09-21 | Discharge: 2022-09-21 | Disposition: A | Discharge status: Home / Self Care | Payer: Medicare Other | Source: Ambulatory Visit | Attending: Student in an Organized Health Care Education/Training Program | Admitting: Student in an Organized Health Care Education/Training Program

## 2022-09-21 DIAGNOSIS — G8929 Other chronic pain: Secondary | ICD-10-CM | POA: Diagnosis present

## 2022-09-21 DIAGNOSIS — G894 Chronic pain syndrome: Secondary | ICD-10-CM | POA: Diagnosis present

## 2022-09-21 DIAGNOSIS — M48062 Spinal stenosis, lumbar region with neurogenic claudication: Secondary | ICD-10-CM | POA: Diagnosis present

## 2022-09-21 DIAGNOSIS — M47816 Spondylosis without myelopathy or radiculopathy, lumbar region: Secondary | ICD-10-CM | POA: Diagnosis present

## 2022-09-21 DIAGNOSIS — M5416 Radiculopathy, lumbar region: Secondary | ICD-10-CM | POA: Insufficient documentation

## 2022-09-27 ENCOUNTER — Telehealth: Payer: Self-pay | Admitting: Student in an Organized Health Care Education/Training Program

## 2022-09-27 NOTE — Telephone Encounter (Signed)
Patient is taking antibiotic and steroid taper for asthmatic bronchitis and she will be taking that x 10 days and 5 days respectively.  I told her that if she has finished the antibiotic and is well that we should be able to proceed with procedure.  She did ask for explanation of procedure that she will be having, BLF and education was provided.  She did ask if we could make her a little more pain free for the procedure.  Patient is scheduled to have a valium but she could discuss an IV with BL on that day.  Patient verbalizes u/o information.

## 2022-09-27 NOTE — Telephone Encounter (Signed)
PT called states that she was given two  prescribed on yesterday. PT wanted to may sure that she will be okay to get procedure done on 10-05-22. The two medications that patient is taking are Amoxiclav and Methylprednlsolone. Please give patient a call. TY

## 2022-10-05 ENCOUNTER — Ambulatory Visit
Admission: RE | Admit: 2022-10-05 | Discharge: 2022-10-05 | Disposition: A | Discharge status: Home / Self Care | Payer: Medicare Other | Source: Ambulatory Visit | Attending: Student in an Organized Health Care Education/Training Program | Admitting: Student in an Organized Health Care Education/Training Program

## 2022-10-05 ENCOUNTER — Encounter: Payer: Self-pay | Admitting: Student in an Organized Health Care Education/Training Program

## 2022-10-05 ENCOUNTER — Ambulatory Visit
Payer: Medicare Other | Attending: Student in an Organized Health Care Education/Training Program | Admitting: Student in an Organized Health Care Education/Training Program

## 2022-10-05 DIAGNOSIS — Z79899 Other long term (current) drug therapy: Secondary | ICD-10-CM | POA: Diagnosis not present

## 2022-10-05 DIAGNOSIS — M545 Low back pain, unspecified: Secondary | ICD-10-CM | POA: Diagnosis not present

## 2022-10-05 DIAGNOSIS — M47816 Spondylosis without myelopathy or radiculopathy, lumbar region: Secondary | ICD-10-CM

## 2022-10-05 DIAGNOSIS — G894 Chronic pain syndrome: Secondary | ICD-10-CM | POA: Insufficient documentation

## 2022-10-05 MED ORDER — DEXAMETHASONE SODIUM PHOSPHATE 10 MG/ML IJ SOLN
20.0000 mg | Freq: Once | INTRAMUSCULAR | Status: AC
  Administered 2022-10-05: 20 mg
  Filled 2022-10-05: qty 2

## 2022-10-05 MED ORDER — DIAZEPAM 5 MG PO TABS
5.0000 mg | ORAL_TABLET | ORAL | Status: AC
  Administered 2022-10-05: 5 mg via ORAL

## 2022-10-05 MED ORDER — LIDOCAINE HCL 2 % IJ SOLN
20.0000 mL | Freq: Once | INTRAMUSCULAR | Status: AC
  Administered 2022-10-05: 400 mg
  Filled 2022-10-05: qty 40

## 2022-10-05 MED ORDER — ROPIVACAINE HCL 2 MG/ML IJ SOLN
9.0000 mL | Freq: Once | INTRAMUSCULAR | Status: AC
  Administered 2022-10-05: 9 mL via PERINEURAL
  Filled 2022-10-05: qty 20

## 2022-10-05 MED ORDER — DIAZEPAM 5 MG PO TABS
ORAL_TABLET | ORAL | Status: AC
  Filled 2022-10-05: qty 1

## 2022-10-05 NOTE — Progress Notes (Signed)
Safety precautions to be maintained throughout the outpatient stay will include: orient to surroundings, keep bed in low position, maintain call bell within reach at all times, provide assistance with transfer out of bed and ambulation.  

## 2022-10-05 NOTE — Progress Notes (Signed)
PROVIDER NOTE: Interpretation of information contained herein should be left to medically-trained personnel. Specific patient instructions are provided elsewhere under "Patient Instructions" section of medical record. This document was created in part using STT-dictation technology, any transcriptional errors that may result from this process are unintentional.  Patient: Eileen Maldonado Type: Established DOB: 07-05-1945 MRN: [id number] PCP: Jerl Mina, MD  Service: Procedure DOS: 10/05/2022 Setting: Ambulatory Location: Ambulatory outpatient facility Delivery: Face-to-face Provider: Edward Jolly, MD Specialty: Interventional Pain Management Specialty designation: 09 Location: Outpatient facility Ref. Prov.: Edward Jolly, MD       Interventional Therapy   Procedure: Lumbar Facet, Medial Branch Block(s) #1  Laterality: Bilateral  Level: L3, L4, and L5 Medial Branch Level(s). Injecting these levels blocks the L3-4 and L4-5 lumbar facet joints. Imaging: Fluoroscopic guidance         Anesthesia: Local anesthesia (1-2% Lidocaine) Anxiolysis: Performed by: Edward Jolly, MD  Primary Purpose: Diagnostic/Therapeutic Indications: Low back pain severe enough to impact quality of life or function. 1. Lumbar facet arthropathy   2. Lumbar spondylosis   3. Chronic pain syndrome    NAS-11 Pain score:   Pre-procedure: 7 /10   Post-procedure: 0-No pain/10     Position / Prep / Materials:  Position: Prone  Prep solution: DuraPrep (Iodine Povacrylex [0.7% available iodine] and Isopropyl Alcohol, 74% w/w) Area Prepped: Posterolateral Lumbosacral Spine (Wide prep: From the lower border of the scapula down to the end of the tailbone and from flank to flank.)  Materials:  Tray: Block Needle(s):  Type: Spinal  Gauge (G): 22  Length: 3.5-in Qty: 2      H&P (Pre-op Assessment):  Eileen Maldonado is a 77 y.o. (year old), female patient, seen today for interventional treatment. She  has a past  surgical history that includes appy; breast biopsy; hernia repair; Hernia repair; Knee Arthroplasty (Left, 07/06/2016); Breast surgery (Left); Appendectomy; Hysteroscopy with D & C (N/A, 12/22/2017); and Colonoscopy (N/A, 04/13/2022). Eileen Maldonado has a current medication list which includes the following prescription(s): abatacept, albuterol, amoxicillin-clavulanate, butenafine hcl, vitamin d3, citalopram, conjugated estrogens, dextran 70-hypromellose, diclofenac, fluticasone, fluticasone furoate-vilanterol, hydrochlorothiazide, leflunomide, loperamide, tizanidine, and pregabalin. Her primarily concern today is the Back Pain (Back and buttock pain)  Initial Vital Signs:  Pulse/HCG Rate: 84ECG Heart Rate: 76 Temp: (!) 97 F (36.1 C) Resp: 18 BP: (!) 142/84 SpO2: 94 %  BMI: Estimated body mass index is 26.75 kg/m as calculated from the following:   Height as of this encounter: 5\' 3"  (1.6 m).   Weight as of this encounter: 151 lb (68.5 kg).  Risk Assessment: Allergies: Reviewed. She is allergic to amlodipine, lisinopril, celebrex [celecoxib], methotrexate derivatives, bupropion, doxycycline, etanercept, and latex.  Allergy Precautions: None required Coagulopathies: Reviewed. None identified.  Blood-thinner therapy: None at this time Active Infection(s): Reviewed. None identified. Ms. Nuncio is afebrile  Site Confirmation: Eileen Maldonado was asked to confirm the procedure and laterality before marking the site Procedure checklist: Completed Consent: Before the procedure and under the influence of no sedative(s), amnesic(s), or anxiolytics, the patient was informed of the treatment options, risks and possible complications. To fulfill our ethical and legal obligations, as recommended by the American Medical Association's Code of Ethics, I have informed the patient of my clinical impression; the nature and purpose of the treatment or procedure; the risks, benefits, and possible complications of the  intervention; the alternatives, including doing nothing; the risk(s) and benefit(s) of the alternative treatment(s) or procedure(s); and the risk(s) and benefit(s) of doing nothing. The  patient was provided information about the general risks and possible complications associated with the procedure. These may include, but are not limited to: failure to achieve desired goals, infection, bleeding, organ or nerve damage, allergic reactions, paralysis, and death. In addition, the patient was informed of those risks and complications associated to Spine-related procedures, such as failure to decrease pain; infection (i.e.: Meningitis, epidural or intraspinal abscess); bleeding (i.e.: epidural hematoma, subarachnoid hemorrhage, or any other type of intraspinal or peri-dural bleeding); organ or nerve damage (i.e.: Any type of peripheral nerve, nerve root, or spinal cord injury) with subsequent damage to sensory, motor, and/or autonomic systems, resulting in permanent pain, numbness, and/or weakness of one or several areas of the body; allergic reactions; (i.e.: anaphylactic reaction); and/or death. Furthermore, the patient was informed of those risks and complications associated with the medications. These include, but are not limited to: allergic reactions (i.e.: anaphylactic or anaphylactoid reaction(s)); adrenal axis suppression; blood sugar elevation that in diabetics may result in ketoacidosis or comma; water retention that in patients with history of congestive heart failure may result in shortness of breath, pulmonary edema, and decompensation with resultant heart failure; weight gain; swelling or edema; medication-induced neural toxicity; particulate matter embolism and blood vessel occlusion with resultant organ, and/or nervous system infarction; and/or aseptic necrosis of one or more joints. Finally, the patient was informed that Medicine is not an exact science; therefore, there is also the possibility of  unforeseen or unpredictable risks and/or possible complications that may result in a catastrophic outcome. The patient indicated having understood very clearly. We have given the patient no guarantees and we have made no promises. Enough time was given to the patient to ask questions, all of which were answered to the patient's satisfaction. Ms. Morre has indicated that she wanted to continue with the procedure. Attestation: I, the ordering provider, attest that I have discussed with the patient the benefits, risks, side-effects, alternatives, likelihood of achieving goals, and potential problems during recovery for the procedure that I have provided informed consent. Date  Time: 10/05/2022  8:45 AM   Pre-Procedure Preparation:  Monitoring: As per clinic protocol. Respiration, ETCO2, SpO2, BP, heart rate and rhythm monitor placed and checked for adequate function Safety Precautions: Patient was assessed for positional comfort and pressure points before starting the procedure. Time-out: I initiated and conducted the "Time-out" before starting the procedure, as per protocol. The patient was asked to participate by confirming the accuracy of the "Time Out" information. Verification of the correct person, site, and procedure were performed and confirmed by me, the nursing staff, and the patient. "Time-out" conducted as per Joint Commission's Universal Protocol (UP.01.01.01). Time: 0931 Start Time: 0931 hrs.  Description of Procedure:          Laterality: (see above) Targeted Levels: (see above)  Safety Precautions: Aspiration looking for blood return was conducted prior to all injections. At no point did we inject any substances, as a needle was being advanced. Before injecting, the patient was told to immediately notify me if she was experiencing any new onset of "ringing in the ears, or metallic taste in the mouth". No attempts were made at seeking any paresthesias. Safe injection practices and needle  disposal techniques used. Medications properly checked for expiration dates. SDV (single dose vial) medications used. After the completion of the procedure, all disposable equipment used was discarded in the proper designated medical waste containers. Local Anesthesia: Protocol guidelines were followed. The patient was positioned over the fluoroscopy table. The area was  prepped in the usual manner. The time-out was completed. The target area was identified using fluoroscopy. A 12-in long, straight, sterile hemostat was used with fluoroscopic guidance to locate the targets for each level blocked. Once located, the skin was marked with an approved surgical skin marker. Once all sites were marked, the skin (epidermis, dermis, and hypodermis), as well as deeper tissues (fat, connective tissue and muscle) were infiltrated with a small amount of a short-acting local anesthetic, loaded on a 10cc syringe with a 25G, 1.5-in  Needle. An appropriate amount of time was allowed for local anesthetics to take effect before proceeding to the next step. Local Anesthetic: Lidocaine 2.0% The unused portion of the local anesthetic was discarded in the proper designated containers. Technical description of process:  L3 Medial Branch Nerve Block (MBB): The target area for the L3 medial branch is at the junction of the postero-lateral aspect of the superior articular process and the superior, posterior, and medial edge of the transverse process of L4. Under fluoroscopic guidance, a Quincke needle was inserted until contact was made with os over the superior postero-lateral aspect of the pedicular shadow (target area). After negative aspiration for blood, 2 mL of the nerve block solution was injected without difficulty or complication. The needle was removed intact. L4 Medial Branch Nerve Block (MBB): The target area for the L4 medial branch is at the junction of the postero-lateral aspect of the superior articular process and the  superior, posterior, and medial edge of the transverse process of L5. Under fluoroscopic guidance, a Quincke needle was inserted until contact was made with os over the superior postero-lateral aspect of the pedicular shadow (target area). After negative aspiration for blood, 2mL of the nerve block solution was injected without difficulty or complication. The needle was removed intact. L5 Medial Branch Nerve Block (MBB): The target area for the L5 medial branch is at the junction of the postero-lateral aspect of the superior articular process and the superior, posterior, and medial edge of the sacral ala. Under fluoroscopic guidance, a Quincke needle was inserted until contact was made with os over the superior postero-lateral aspect of the pedicular shadow (target area). After negative aspiration for blood, 2mL of the nerve block solution was injected without difficulty or complication. The needle was removed intact.   Once the entire procedure was completed, the treated area was cleaned, making sure to leave some of the prepping solution back to take advantage of its long term bactericidal properties.         Illustration of the posterior view of the lumbar spine and the posterior neural structures. Laminae of L2 through S1 are labeled. DPRL5, dorsal primary ramus of L5; DPRS1, dorsal primary ramus of S1; DPR3, dorsal primary ramus of L3; FJ, facet (zygapophyseal) joint L3-L4; I, inferior articular process of L4; LB1, lateral branch of dorsal primary ramus of L1; IAB, inferior articular branches from L3 medial branch (supplies L4-L5 facet joint); IBP, intermediate branch plexus; MB3, medial branch of dorsal primary ramus of L3; NR3, third lumbar nerve root; S, superior articular process of L5; SAB, superior articular branches from L4 (supplies L4-5 facet joint also); TP3, transverse process of L3.   Facet Joint Innervation (* possible contribution)  L1-2 T12, L1 (L2*)  Medial Branch  L2-3 L1, L2  (L3*)         "          "  L3-4 L2, L3 (L4*)         "          "  L4-5 L3, L4 (L5*)         "          "  L5-S1 L4, L5, S1          "          "    Vitals:   10/05/22 0848 10/05/22 0931 10/05/22 0936 10/05/22 0941  BP: (!) 142/84 124/85 (!) 128/98 126/74  Pulse: 84     Resp:  18 16 18   Temp: (!) 97 F (36.1 C)     TempSrc: Temporal     SpO2: 94% 96% 99% 99%  Weight: 151 lb (68.5 kg)     Height: 5\' 3"  (1.6 m)        End Time: 0940 hrs.  Imaging Guidance (Spinal):          Type of Imaging Technique: Fluoroscopy Guidance (Spinal) Indication(s): Assistance in needle guidance and placement for procedures requiring needle placement in or near specific anatomical locations not easily accessible without such assistance. Exposure Time: Please see nurses notes. Contrast: None used. Fluoroscopic Guidance: I was personally present during the use of fluoroscopy. "Tunnel Vision Technique" used to obtain the best possible view of the target area. Parallax error corrected before commencing the procedure. "Direction-depth-direction" technique used to introduce the needle under continuous pulsed fluoroscopy. Once target was reached, antero-posterior, oblique, and lateral fluoroscopic projection used confirm needle placement in all planes. Images permanently stored in EMR. Interpretation: No contrast injected. I personally interpreted the imaging intraoperatively. Adequate needle placement confirmed in multiple planes. Permanent images saved into the patient's record.  Post-operative Assessment:  Post-procedure Vital Signs:  Pulse/HCG Rate: 8476 Temp: (!) 97 F (36.1 C) Resp: 18 BP: 126/74 SpO2: 99 %  EBL: None  Complications: No immediate post-treatment complications observed by team, or reported by patient.  Note: The patient tolerated the entire procedure well. A repeat set of vitals were taken after the procedure and the patient was kept under observation following institutional policy,  for this type of procedure. Post-procedural neurological assessment was performed, showing return to baseline, prior to discharge. The patient was provided with post-procedure discharge instructions, including a section on how to identify potential problems. Should any problems arise concerning this procedure, the patient was given instructions to immediately contact us, at any time, without hesitation. In any case, we plan to contact the patient by telephone for a follow-up status report regarding this interventional procedure.  Comments:  No additional relevant information.  Plan of Care (POC)  Orders:  Orders Placed This Encounter  Procedures   DG PAIN CLIN[facility] C-ARM 1-60 MIN NO REPORT    Intraoperative interpretation by procedural physician at [facility].    Standing Status:   Standing    Number of Occurrences:   1    Order Specific Question:   Reason for exam:    Answer:   Assistance in needle guidance and placement for procedures requiring needle placement in or near specific anatomical locations not easily accessible without such assistance.    Medications ordered for procedure: Meds ordered this encounter  Medications   lidocaine (XYLOCAINE) 2 % (with pres) injection 400 mg   diazepam (VALIUM) tablet 5 mg    Make sure Flumazenil is available in the pyxis when using this medication. If oversedation occurs, administer 0.2 mg IV over 15 sec. If after 45 sec no response, administer 0.2 mg again over 1 min; may repeat at 1 min intervals; not to exceed 4 doses (1 mg)  dexamethasone (DECADRON) injection 20 mg   ropivacaine (PF) 2 mg/mL (0.2%) (NAROPIN) injection 9 mL   ropivacaine (PF) 2 mg/mL (0.2%) (NAROPIN) injection 9 mL   Medications administered: We administered lidocaine, diazepam, dexamethasone, ropivacaine (PF) 2 mg/mL (0.2%), and ropivacaine (PF) 2 mg/mL (0.2%).  See the medical record for exact dosing, route, and time of administration.  Follow-up plan:   Return  in about 4 weeks (around 11/02/2022) for Post Procedure Evaluation, in person.       L4-L5 ESI 12/13/2021, 08/15/22; B/L L3,4,5 MBNB 10/05/22     Recent Visits Date Type Provider Dept  09/13/22 Office Visit Edward Jolly, MD [facility]  08/15/22 Procedure visit Edward Jolly, MD [facility]  07/26/22 Office Visit Edward Jolly, MD [facility]  Showing recent visits within past 90 days and meeting all other requirements Today's Visits Date Type Provider Dept  10/05/22 Procedure visit Edward Jolly, MD [facility]  Showing today's visits and meeting all other requirements Future Appointments Date Type Provider Dept  11/02/22 Appointment Edward Jolly, MD [facility]  Showing future appointments within next 90 days and meeting all other requirements  Disposition: Discharge home  Discharge (Date  Time): 10/05/2022; 0951 hrs.   Primary Care Physician: Jerl Mina, MD Location: [facility] Note by: Edward Jolly, MD (TTS technology used. I apologize for any typographical errors that were not detected and corrected.) Date: 10/05/2022; Time: 9:56 AM  Disclaimer:  Medicine is not an Visual merchandiser. The only guarantee in medicine is that nothing is guaranteed. It is important to note that the decision to proceed with this intervention was based on the information collected from the patient. The Data and conclusions were drawn from the patient's questionnaire, the interview, and the physical examination. Because the information was provided in large part by the patient, it cannot be guaranteed that it has not been purposely or unconsciously manipulated. Every effort has been made to obtain as much relevant data as possible for this evaluation. It is important to note that the conclusions that lead to this procedure are derived in large part from the available data. Always take into account that the treatment will also be  dependent on availability of resources and existing treatment guidelines, considered by other Pain Management Practitioners as being common knowledge and practice, at the time of the intervention. For Medico-Legal purposes, it is also important to point out that variation in procedural techniques and pharmacological choices are the acceptable norm. The indications, contraindications, technique, and results of the above procedure should only be interpreted and judged by a Board-Certified Interventional Pain Specialist with extensive familiarity and expertise in the same exact procedure and technique.

## 2022-10-05 NOTE — Patient Instructions (Signed)
Pain Management Discharge Instructions  General Discharge Instructions :  If you need to reach your doctor call: Monday-Friday 8:00 am - 4:00 pm at [phone] or toll free [phone].  After clinic hours [phone] to have operator reach doctor.  Bring all of your medication bottles to all your appointments in the pain clinic.  To cancel or reschedule your appointment with Pain Management please remember to call 24 hours in advance to avoid a fee.  Refer to the educational materials which you have been given on: General Risks, I had my Procedure. Discharge Instructions, Post Sedation.  Post Procedure Instructions:  The drugs you were given will stay in your system until tomorrow, so for the next 24 hours you should not drive, make any legal decisions or drink any alcoholic beverages.  You may eat anything you prefer, but it is better to start with liquids then soups and crackers, and gradually work up to solid foods.  Please notify your doctor immediately if you have any unusual bleeding, trouble breathing or pain that is not related to your normal pain.  Depending on the type of procedure that was done, some parts of your body may feel week and/or numb.  This usually clears up by tonight or the next day.  Walk with the use of an assistive device or accompanied by an adult for the 24 hours.  You may use ice on the affected area for the first 24 hours.  Put ice in a Ziploc bag and cover with a towel and place against area 15 minutes on 15 minutes off.  You may switch to heat after 24 hours.Facet Blocks Patient Information  Description: The facets are joints in the spine between the vertebrae.  Like any joints in the body, facets can become irritated and painful.  Arthritis can also effect the facets.  By injecting steroids and local anesthetic in and around these joints, we can temporarily block the nerve supply to them.  Steroids act directly on irritated nerves and tissues to  reduce selling and inflammation which often leads to decreased pain.  Facet blocks may be done anywhere along the spine from the neck to the low back depending upon the location of your pain.   After numbing the skin with local anesthetic (like Novocaine), a small needle is passed onto the facet joints under x-ray guidance.  You may experience a sensation of pressure while this is being done.  The entire block usually lasts about 15-25 minutes.   Conditions which may be treated by facet blocks:  Low back/buttock pain Neck/shoulder pain Certain types of headaches  Preparation for the injection:  Do not eat any solid food or dairy products within 8 hours of your appointment. You may drink clear liquid up to 3 hours before appointment.  Clear liquids include water, black coffee, juice or soda.  No milk or cream please. You may take your regular medication, including pain medications, with a sip of water before your appointment.  Diabetics should hold regular insulin (if taken separately) and take 1/2 normal NPH dose the morning of the procedure.  Carry some sugar containing items with you to your appointment. A driver must accompany you and be prepared to drive you home after your procedure. Bring all your current medications with you. An IV may be inserted and sedation may be given at the discretion of the physician. A blood pressure cuff, EKG and other monitors will often be applied during the procedure.  Some patients may need to   have extra oxygen administered for a short period. You will be asked to provide medical information, including your allergies and medications, prior to the procedure.  We must know immediately if you are taking blood thinners (like Coumadin/Warfarin) or if you are allergic to IV iodine contrast (dye).  We must know if you could possible be pregnant.  Possible side-effects:  Bleeding from needle site Infection (rare, may require surgery) Nerve injury (rare) Numbness  & tingling (temporary) Difficulty urinating (rare, temporary) Spinal headache (a headache worse with upright posture) Light-headedness (temporary) Pain at injection site (serveral days) Decreased blood pressure (rare, temporary) Weakness in arm/leg (temporary) Pressure sensation in back/neck (temporary)   Call if you experience:  Fever/chills associated with headache or increased back/neck pain Headache worsened by an upright position New onset, weakness or numbness of an extremity below the injection site Hives or difficulty breathing (go to the emergency room) Inflammation or drainage at the injection site(s) Severe back/neck pain greater than usual New symptoms which are concerning to you  Please note:  Although the local anesthetic injected can often make your back or neck feel good for several hours after the injection, the pain will likely return. It takes 3-7 days for steroids to work.  You may not notice any pain relief for at least one week.  If effective, we will often do a series of 2-3 injections spaced 3-6 weeks apart to maximally decrease your pain.  After the initial series, you may be a candidate for a more permanent nerve block of the facets.  If you have any questions, please call [phone]  [facility] 

## 2022-10-05 NOTE — Progress Notes (Deleted)
PROVIDER NOTE: Interpretation of information contained herein should be left to medically-trained personnel. Specific patient instructions are provided elsewhere under "Patient Instructions" section of medical record. This document was created in part using STT-dictation technology, any transcriptional errors that may result from this process are unintentional.  Patient: Eileen Maldonado Type: Established DOB: June 27, 1945 MRN: [id number] PCP: Jerl Mina, MD  Service: Procedure DOS: 10/05/2022 Setting: Ambulatory Location: Ambulatory outpatient facility Delivery: Face-to-face Provider: Edward Jolly, MD Specialty: Interventional Pain Management Specialty designation: 09 Location: Outpatient facility Ref. Prov.: Edward Jolly, MD       Interventional Therapy   Procedure: Lumbar Facet, Medial Branch Block(s)          Laterality:    ***     Level: T12, L1, L2, L3, L4, L5, and S1 Medial Branch Level(s). Injecting these levels blocks the L1-2, L2-3, L3-4, L4-5, and L5-S1 lumbar facet joints. Imaging: Fluoroscopic guidance         Anesthesia: Local anesthesia (1-2% Lidocaine) Anxiolysis:    ***       ***            Sedation:                         DOS: 10/05/2022 Performed by: Edward Jolly, MD  Primary Purpose: Diagnostic/Therapeutic Indications: Low back pain severe enough to impact quality of life or function. 1. Lumbar facet arthropathy   2. Lumbar spondylosis   3. Chronic pain syndrome    NAS-11 Pain score:   Pre-procedure: 7 /10   Post-procedure: 7 /10     Position / Prep / Materials:  Position: Prone  Prep solution: DuraPrep (Iodine Povacrylex [0.7% available iodine] and Isopropyl Alcohol, 74% w/w) Area Prepped: Posterolateral Lumbosacral Spine (Wide prep: From the lower border of the scapula down to the end of the tailbone and from flank to flank.)  Materials:  Tray: Block Needle(s):  Type: Spinal  Gauge (G): 22  Length: 5-in Qty:    ***      H&P (Pre-op  Assessment):  Eileen Maldonado is a 77 y.o. (year old), female patient, seen today for interventional treatment. She  has a past surgical history that includes appy; breast biopsy; hernia repair; Hernia repair; Knee Arthroplasty (Left, 07/06/2016); Breast surgery (Left); Appendectomy; Hysteroscopy with D & C (N/A, 12/22/2017); and Colonoscopy (N/A, 04/13/2022). Eileen Maldonado has a current medication list which includes the following prescription(s): abatacept, albuterol, amoxicillin-clavulanate, butenafine hcl, vitamin d3, citalopram, conjugated estrogens, dextran 70-hypromellose, diclofenac, fluticasone, fluticasone furoate-vilanterol, hydrochlorothiazide, leflunomide, loperamide, tizanidine, and pregabalin, and the following Facility-Administered Medications: dexamethasone, lidocaine, ropivacaine (pf) 2 mg/ml (0.2%), and ropivacaine (pf) 2 mg/ml (0.2%). Her primarily concern today is the Back Pain (Back and buttock pain)  Initial Vital Signs:  Pulse/HCG Rate: 84  Temp: (!) 97 F (36.1 C) Resp:   BP: (!) 142/84 SpO2: 94 %  BMI: Estimated body mass index is 26.75 kg/m as calculated from the following:   Height as of this encounter: 5\' 3"  (1.6 m).   Weight as of this encounter: 151 lb (68.5 kg).  Risk Assessment: Allergies: Reviewed. She is allergic to amlodipine, lisinopril, celebrex [celecoxib], methotrexate derivatives, bupropion, doxycycline, etanercept, and latex.  Allergy Precautions: None required Coagulopathies: Reviewed. None identified.  Blood-thinner therapy: None at this time Active Infection(s): Reviewed. None identified. Eileen Maldonado is afebrile  Site Confirmation: Eileen Maldonado was asked to confirm the procedure and laterality before marking the site Procedure checklist: Completed Consent: Before the procedure and under  the influence of no sedative(s), amnesic(s), or anxiolytics, the patient was informed of the treatment options, risks and possible complications. To fulfill our ethical and  legal obligations, as recommended by the American Medical Association's Code of Ethics, I have informed the patient of my clinical impression; the nature and purpose of the treatment or procedure; the risks, benefits, and possible complications of the intervention; the alternatives, including doing nothing; the risk(s) and benefit(s) of the alternative treatment(s) or procedure(s); and the risk(s) and benefit(s) of doing nothing. The patient was provided information about the general risks and possible complications associated with the procedure. These may include, but are not limited to: failure to achieve desired goals, infection, bleeding, organ or nerve damage, allergic reactions, paralysis, and death. In addition, the patient was informed of those risks and complications associated to Spine-related procedures, such as failure to decrease pain; infection (i.e.: Meningitis, epidural or intraspinal abscess); bleeding (i.e.: epidural hematoma, subarachnoid hemorrhage, or any other type of intraspinal or peri-dural bleeding); organ or nerve damage (i.e.: Any type of peripheral nerve, nerve root, or spinal cord injury) with subsequent damage to sensory, motor, and/or autonomic systems, resulting in permanent pain, numbness, and/or weakness of one or several areas of the body; allergic reactions; (i.e.: anaphylactic reaction); and/or death. Furthermore, the patient was informed of those risks and complications associated with the medications. These include, but are not limited to: allergic reactions (i.e.: anaphylactic or anaphylactoid reaction(s)); adrenal axis suppression; blood sugar elevation that in diabetics may result in ketoacidosis or comma; water retention that in patients with history of congestive heart failure may result in shortness of breath, pulmonary edema, and decompensation with resultant heart failure; weight gain; swelling or edema; medication-induced neural toxicity; particulate matter  embolism and blood vessel occlusion with resultant organ, and/or nervous system infarction; and/or aseptic necrosis of one or more joints. Finally, the patient was informed that Medicine is not an exact science; therefore, there is also the possibility of unforeseen or unpredictable risks and/or possible complications that may result in a catastrophic outcome. The patient indicated having understood very clearly. We have given the patient no guarantees and we have made no promises. Enough time was given to the patient to ask questions, all of which were answered to the patient's satisfaction. Eileen Maldonado has indicated that she wanted to continue with the procedure. Attestation: I, the ordering provider, attest that I have discussed with the patient the benefits, risks, side-effects, alternatives, likelihood of achieving goals, and potential problems during recovery for the procedure that I have provided informed consent. Date  Time: {CHL [facility] TIME CHOICES:[id number]}   Pre-Procedure Preparation:  Monitoring: As per clinic protocol. Respiration, ETCO2, SpO2, BP, heart rate and rhythm monitor placed and checked for adequate function Safety Precautions: Patient was assessed for positional comfort and pressure points before starting the procedure. Time-out: I initiated and conducted the "Time-out" before starting the procedure, as per protocol. The patient was asked to participate by confirming the accuracy of the "Time Out" information. Verification of the correct person, site, and procedure were performed and confirmed by me, the nursing staff, and the patient. "Time-out" conducted as per Joint Commission's Universal Protocol (UP.01.01.01). Time:   Start Time:   hrs.  Description of Procedure:          Laterality: (see above) Targeted Levels: (see above)  Safety Precautions: Aspiration looking for blood return was conducted prior to all injections. At no point did we inject any substances, as a  needle was being advanced. Before  injecting, the patient was told to immediately notify me if she was experiencing any new onset of "ringing in the ears, or metallic taste in the mouth". No attempts were made at seeking any paresthesias. Safe injection practices and needle disposal techniques used. Medications properly checked for expiration dates. SDV (single dose vial) medications used. After the completion of the procedure, all disposable equipment used was discarded in the proper designated medical waste containers. Local Anesthesia: Protocol guidelines were followed. The patient was positioned over the fluoroscopy table. The area was prepped in the usual manner. The time-out was completed. The target area was identified using fluoroscopy. A 12-in long, straight, sterile hemostat was used with fluoroscopic guidance to locate the targets for each level blocked. Once located, the skin was marked with an approved surgical skin marker. Once all sites were marked, the skin (epidermis, dermis, and hypodermis), as well as deeper tissues (fat, connective tissue and muscle) were infiltrated with a small amount of a short-acting local anesthetic, loaded on a 10cc syringe with a 25G, 1.5-in  Needle. An appropriate amount of time was allowed for local anesthetics to take effect before proceeding to the next step. Local Anesthetic: Lidocaine 2.0% The unused portion of the local anesthetic was discarded in the proper designated containers. Technical description of process:  L2 Medial Branch Nerve Block (MBB): The target area for the L2 medial branch is at the junction of the postero-lateral aspect of the superior articular process and the superior, posterior, and medial edge of the transverse process of L3. Under fluoroscopic guidance, a Quincke needle was inserted until contact was made with os over the superior postero-lateral aspect of the pedicular shadow (target area). After negative aspiration for blood, 0.5 mL of  the nerve block solution was injected without difficulty or complication. The needle was removed intact. L3 Medial Branch Nerve Block (MBB): The target area for the L3 medial branch is at the junction of the postero-lateral aspect of the superior articular process and the superior, posterior, and medial edge of the transverse process of L4. Under fluoroscopic guidance, a Quincke needle was inserted until contact was made with os over the superior postero-lateral aspect of the pedicular shadow (target area). After negative aspiration for blood, 0.5 mL of the nerve block solution was injected without difficulty or complication. The needle was removed intact. L4 Medial Branch Nerve Block (MBB): The target area for the L4 medial branch is at the junction of the postero-lateral aspect of the superior articular process and the superior, posterior, and medial edge of the transverse process of L5. Under fluoroscopic guidance, a Quincke needle was inserted until contact was made with os over the superior postero-lateral aspect of the pedicular shadow (target area). After negative aspiration for blood, 0.5 mL of the nerve block solution was injected without difficulty or complication. The needle was removed intact. L5 Medial Branch Nerve Block (MBB): The target area for the L5 medial branch is at the junction of the postero-lateral aspect of the superior articular process and the superior, posterior, and medial edge of the sacral ala. Under fluoroscopic guidance, a Quincke needle was inserted until contact was made with os over the superior postero-lateral aspect of the pedicular shadow (target area). After negative aspiration for blood, 0.5 mL of the nerve block solution was injected without difficulty or complication. The needle was removed intact. S1 Medial Branch Nerve Block (MBB): The target area for the S1 medial branch is at the posterior and inferior 6 o'clock position of the L5-S1  facet joint. Under fluoroscopic  guidance, the Quincke needle inserted for the L5 MBB was redirected until contact was made with os over the inferior and postero aspect of the sacrum, at the 6 o' clock position under the L5-S1 facet joint (Target area). After negative aspiration for blood, 0.5 mL of the nerve block solution was injected without difficulty or complication. The needle was removed intact.  Once the entire procedure was completed, the treated area was cleaned, making sure to leave some of the prepping solution back to take advantage of its long term bactericidal properties.         Illustration of the posterior view of the lumbar spine and the posterior neural structures. Laminae of L2 through S1 are labeled. DPRL5, dorsal primary ramus of L5; DPRS1, dorsal primary ramus of S1; DPR3, dorsal primary ramus of L3; FJ, facet (zygapophyseal) joint L3-L4; I, inferior articular process of L4; LB1, lateral branch of dorsal primary ramus of L1; IAB, inferior articular branches from L3 medial branch (supplies L4-L5 facet joint); IBP, intermediate branch plexus; MB3, medial branch of dorsal primary ramus of L3; NR3, third lumbar nerve root; S, superior articular process of L5; SAB, superior articular branches from L4 (supplies L4-5 facet joint also); TP3, transverse process of L3.   Facet Joint Innervation (* possible contribution)  L1-2 T12, L1 (L2*)  Medial Branch  L2-3 L1, L2 (L3*)         "          "  L3-4 L2, L3 (L4*)         "          "  L4-5 L3, L4 (L5*)         "          "  L5-S1 L4, L5, S1          "          "    Vitals:   10/05/22 0848  BP: (!) 142/84  Pulse: 84  Temp: (!) 97 F (36.1 C)  TempSrc: Temporal  SpO2: 94%  Weight: 151 lb (68.5 kg)  Height: 5\' 3"  (1.6 m)     End Time:   hrs.  Imaging Guidance (Spinal):          Type of Imaging Technique: Fluoroscopy Guidance (Spinal) Indication(s): Assistance in needle guidance and placement for procedures requiring needle placement in or near specific  anatomical locations not easily accessible without such assistance. Exposure Time: Please see nurses notes. Contrast: None used. Fluoroscopic Guidance: I was personally present during the use of fluoroscopy. "Tunnel Vision Technique" used to obtain the best possible view of the target area. Parallax error corrected before commencing the procedure. "Direction-depth-direction" technique used to introduce the needle under continuous pulsed fluoroscopy. Once target was reached, antero-posterior, oblique, and lateral fluoroscopic projection used confirm needle placement in all planes. Images permanently stored in EMR. Interpretation: No contrast injected. I personally interpreted the imaging intraoperatively. Adequate needle placement confirmed in multiple planes. Permanent images saved into the patient's record.  Post-operative Assessment:  Post-procedure Vital Signs:  Pulse/HCG Rate: 84  Temp: (!) 97 F (36.1 C) Resp:   BP: (!) 142/84 SpO2: 94 %  EBL: None  Complications: No immediate post-treatment complications observed by team, or reported by patient.  Note: The patient tolerated the entire procedure well. A repeat set of vitals were taken after the procedure and the patient was kept under observation following institutional policy, for this type of procedure. Post-procedural  neurological assessment was performed, showing return to baseline, prior to discharge. The patient was provided with post-procedure discharge instructions, including a section on how to identify potential problems. Should any problems arise concerning this procedure, the patient was given instructions to immediately contact us, at any time, without hesitation. In any case, we plan to contact the patient by telephone for a follow-up status report regarding this interventional procedure.  Comments:  No additional relevant information.  Plan of Care (POC)  Orders:  Orders Placed This Encounter  Procedures   DG PAIN  CLINIC C-ARM 1-60 MIN NO REPORT    Intraoperative interpretation by procedural physician at [facility].    Standing Status:   Standing    Number of Occurrences:   1    Order Specific Question:   Reason for exam:    Answer:   Assistance in needle guidance and placement for procedures requiring needle placement in or near specific anatomical locations not easily accessible without such assistance.   Chronic Opioid Analgesic:  {There is no content from the last Subjective section.}   Medications ordered for procedure: Meds ordered this encounter  Medications   lidocaine (XYLOCAINE) 2 % (with pres) injection 400 mg   dexamethasone (DECADRON) injection 20 mg   ropivacaine (PF) 2 mg/mL (0.2%) (NAROPIN) injection 9 mL   ropivacaine (PF) 2 mg/mL (0.2%) (NAROPIN) injection 9 mL   Medications administered: Eileen Maldonado had no medications administered during this visit.  See the medical record for exact dosing, route, and time of administration.  Follow-up plan:   Return in about 4 weeks (around 11/02/2022) for Post Procedure Evaluation, in person.       L4-L5 ESI 12/13/2021, 08/15/22      Recent Visits Date Type Provider Dept  09/13/22 Office Visit Edward Jolly, MD [facility]  08/15/22 Procedure visit Edward Jolly, MD [facility]  07/26/22 Office Visit Edward Jolly, MD [facility]  Showing recent visits within past 90 days and meeting all other requirements Today's Visits Date Type Provider Dept  10/05/22 Procedure visit Edward Jolly, MD [facility]  Showing today's visits and meeting all other requirements Future Appointments No visits were found meeting these conditions. Showing future appointments within next 90 days and meeting all other requirements  Disposition: Discharge home  Discharge (Date  Time): 10/05/2022;   hrs.   Primary Care Physician: Jerl Mina, MD Location: [facility]  Facility Note by: Edward Jolly, MD (TTS technology used. I apologize for any typographical errors that were not detected and corrected.) Date: 10/05/2022; Time: 8:53 AM  Disclaimer:  Medicine is not an Visual merchandiser. The only guarantee in medicine is that nothing is guaranteed. It is important to note that the decision to proceed with this intervention was based on the information collected from the patient. The Data and conclusions were drawn from the patient's questionnaire, the interview, and the physical examination. Because the information was provided in large part by the patient, it cannot be guaranteed that it has not been purposely or unconsciously manipulated. Every effort has been made to obtain as much relevant data as possible for this evaluation. It is important to note that the conclusions that lead to this procedure are derived in large part from the available data. Always take into account that the treatment will also be dependent on availability of resources and existing treatment guidelines, considered by other Pain Management Practitioners as being common knowledge and practice, at the time of the  intervention. For Medico-Legal purposes, it is also important to point out that variation in procedural techniques and pharmacological choices are the acceptable norm. The indications, contraindications, technique, and results of the above procedure should only be interpreted and judged by a Board-Certified Interventional Pain Specialist with extensive familiarity and expertise in the same exact procedure and technique.  PROVIDER NOTE: Interpretation of information contained herein should be left to medically-trained personnel. Specific patient instructions are provided elsewhere under "Patient Instructions" section of medical record. This document was created in part using STT-dictation technology, any transcriptional errors that may result from this process are unintentional.  Patient:  Eileen Maldonado Type: Established DOB: May 23, 1945 MRN: [id number] PCP: Jerl Mina, MD  Service: Procedure DOS: 10/05/2022 Setting: Ambulatory Location: Ambulatory outpatient facility Delivery: Face-to-face Provider: Edward Jolly, MD Specialty: Interventional Pain Management Specialty designation: 09 Location: Outpatient facility Ref. Prov.: Edward Jolly, MD       Interventional Therapy   Procedure: Lumbar Facet, Medial Branch Block(s)          Laterality:    ***     Level: T12, L1, L2, L3, L4, L5, and S1 Medial Branch Level(s). Injecting these levels blocks the L1-2, L2-3, L3-4, L4-5, and L5-S1 lumbar facet joints. Imaging: Fluoroscopic guidance         Anesthesia: Local anesthesia (1-2% Lidocaine) Anxiolysis:    ***       ***            Sedation:                         DOS: 10/05/2022 Performed by: Edward Jolly, MD  Primary Purpose: Diagnostic/Therapeutic Indications: Low back pain severe enough to impact quality of life or function. 1. Lumbar facet arthropathy   2. Lumbar spondylosis   3. Chronic pain syndrome    NAS-11 Pain score:   Pre-procedure: 7 /10   Post-procedure: 7 /10     Position / Prep / Materials:  Position: Prone  Prep solution: DuraPrep (Iodine Povacrylex [0.7% available iodine] and Isopropyl Alcohol, 74% w/w) Area Prepped: Posterolateral Lumbosacral Spine (Wide prep: From the lower border of the scapula down to the end of the tailbone and from flank to flank.)  Materials:  Tray: Block Needle(s):  Type: Spinal  Gauge (G): 22  Length: 5-in Qty:    ***      H&P (Pre-op Assessment):  Eileen Maldonado is a 77 y.o. (year old), female patient, seen today for interventional treatment. She  has a past surgical history that includes appy; breast biopsy; hernia repair; Hernia repair; Knee Arthroplasty (Left, 07/06/2016); Breast surgery (Left); Appendectomy; Hysteroscopy with D & C (N/A, 12/22/2017); and Colonoscopy (N/A, 04/13/2022). Eileen Maldonado has a current  medication list which includes the following prescription(s): abatacept, albuterol, amoxicillin-clavulanate, butenafine hcl, vitamin d3, citalopram, conjugated estrogens, dextran 70-hypromellose, diclofenac, fluticasone, fluticasone furoate-vilanterol, hydrochlorothiazide, leflunomide, loperamide, tizanidine, and pregabalin, and the following Facility-Administered Medications: dexamethasone, lidocaine, ropivacaine (pf) 2 mg/ml (0.2%), and ropivacaine (pf) 2 mg/ml (0.2%). Her primarily concern today is the Back Pain (Back and buttock pain)  Initial Vital Signs:  Pulse/HCG Rate: 84  Temp: (!) 97 F (36.1 C) Resp:   BP: (!) 142/84 SpO2: 94 %  BMI: Estimated body mass index is 26.75 kg/m as calculated from the following:   Height as of this encounter: 5\' 3"  (1.6 m).   Weight as of this encounter: 151 lb (68.5 kg).  Risk Assessment: Allergies: Reviewed. She is allergic to amlodipine, lisinopril, celebrex [  celecoxib], methotrexate derivatives, bupropion, doxycycline, etanercept, and latex.  Allergy Precautions: None required Coagulopathies: Reviewed. None identified.  Blood-thinner therapy: None at this time Active Infection(s): Reviewed. None identified. Eileen Maldonado is afebrile  Site Confirmation: Eileen Maldonado was asked to confirm the procedure and laterality before marking the site Procedure checklist: Completed Consent: Before the procedure and under the influence of no sedative(s), amnesic(s), or anxiolytics, the patient was informed of the treatment options, risks and possible complications. To fulfill our ethical and legal obligations, as recommended by the American Medical Association's Code of Ethics, I have informed the patient of my clinical impression; the nature and purpose of the treatment or procedure; the risks, benefits, and possible complications of the intervention; the alternatives, including doing nothing; the risk(s) and benefit(s) of the alternative treatment(s) or procedure(s);  and the risk(s) and benefit(s) of doing nothing. The patient was provided information about the general risks and possible complications associated with the procedure. These may include, but are not limited to: failure to achieve desired goals, infection, bleeding, organ or nerve damage, allergic reactions, paralysis, and death. In addition, the patient was informed of those risks and complications associated to Spine-related procedures, such as failure to decrease pain; infection (i.e.: Meningitis, epidural or intraspinal abscess); bleeding (i.e.: epidural hematoma, subarachnoid hemorrhage, or any other type of intraspinal or peri-dural bleeding); organ or nerve damage (i.e.: Any type of peripheral nerve, nerve root, or spinal cord injury) with subsequent damage to sensory, motor, and/or autonomic systems, resulting in permanent pain, numbness, and/or weakness of one or several areas of the body; allergic reactions; (i.e.: anaphylactic reaction); and/or death. Furthermore, the patient was informed of those risks and complications associated with the medications. These include, but are not limited to: allergic reactions (i.e.: anaphylactic or anaphylactoid reaction(s)); adrenal axis suppression; blood sugar elevation that in diabetics may result in ketoacidosis or comma; water retention that in patients with history of congestive heart failure may result in shortness of breath, pulmonary edema, and decompensation with resultant heart failure; weight gain; swelling or edema; medication-induced neural toxicity; particulate matter embolism and blood vessel occlusion with resultant organ, and/or nervous system infarction; and/or aseptic necrosis of one or more joints. Finally, the patient was informed that Medicine is not an exact science; therefore, there is also the possibility of unforeseen or unpredictable risks and/or possible complications that may result in a catastrophic outcome. The patient indicated having  understood very clearly. We have given the patient no guarantees and we have made no promises. Enough time was given to the patient to ask questions, all of which were answered to the patient's satisfaction. Eileen Maldonado has indicated that she wanted to continue with the procedure. Attestation: I, the ordering provider, attest that I have discussed with the patient the benefits, risks, side-effects, alternatives, likelihood of achieving goals, and potential problems during recovery for the procedure that I have provided informed consent. Date  Time: {CHL [facility]-PAIN TIME CHOICES:[id number]}   Pre-Procedure Preparation:  Monitoring: As per clinic protocol. Respiration, ETCO2, SpO2, BP, heart rate and rhythm monitor placed and checked for adequate function Safety Precautions: Patient was assessed for positional comfort and pressure points before starting the procedure. Time-out: I initiated and conducted the "Time-out" before starting the procedure, as per protocol. The patient was asked to participate by confirming the accuracy of the "Time Out" information. Verification of the correct person, site, and procedure were performed and confirmed by me, the nursing staff, and the patient. "Time-out" conducted as per Joint Commission's Universal Protocol (  [id number].01.01.01). Time:   Start Time:   hrs.  Description of Procedure:          Laterality: (see above) Targeted Levels: (see above)  Safety Precautions: Aspiration looking for blood return was conducted prior to all injections. At no point did we inject any substances, as a needle was being advanced. Before injecting, the patient was told to immediately notify me if she was experiencing any new onset of "ringing in the ears, or metallic taste in the mouth". No attempts were made at seeking any paresthesias. Safe injection practices and needle disposal techniques used. Medications properly checked for expiration dates. SDV (single dose vial) medications used.  After the completion of the procedure, all disposable equipment used was discarded in the proper designated medical waste containers. Local Anesthesia: Protocol guidelines were followed. The patient was positioned over the fluoroscopy table. The area was prepped in the usual manner. The time-out was completed. The target area was identified using fluoroscopy. A 12-in long, straight, sterile hemostat was used with fluoroscopic guidance to locate the targets for each level blocked. Once located, the skin was marked with an approved surgical skin marker. Once all sites were marked, the skin (epidermis, dermis, and hypodermis), as well as deeper tissues (fat, connective tissue and muscle) were infiltrated with a small amount of a short-acting local anesthetic, loaded on a 10cc syringe with a 25G, 1.5-in  Needle. An appropriate amount of time was allowed for local anesthetics to take effect before proceeding to the next step. Local Anesthetic: Lidocaine 2.0% The unused portion of the local anesthetic was discarded in the proper designated containers. Technical description of process:  L2 Medial Branch Nerve Block (MBB): The target area for the L2 medial branch is at the junction of the postero-lateral aspect of the superior articular process and the superior, posterior, and medial edge of the transverse process of L3. Under fluoroscopic guidance, a Quincke needle was inserted until contact was made with os over the superior postero-lateral aspect of the pedicular shadow (target area). After negative aspiration for blood, 0.5 mL of the nerve block solution was injected without difficulty or complication. The needle was removed intact. L3 Medial Branch Nerve Block (MBB): The target area for the L3 medial branch is at the junction of the postero-lateral aspect of the superior articular process and the superior, posterior, and medial edge of the transverse process of L4. Under fluoroscopic guidance, a Quincke needle was  inserted until contact was made with os over the superior postero-lateral aspect of the pedicular shadow (target area). After negative aspiration for blood, 0.5 mL of the nerve block solution was injected without difficulty or complication. The needle was removed intact. L4 Medial Branch Nerve Block (MBB): The target area for the L4 medial branch is at the junction of the postero-lateral aspect of the superior articular process and the superior, posterior, and medial edge of the transverse process of L5. Under fluoroscopic guidance, a Quincke needle was inserted until contact was made with os over the superior postero-lateral aspect of the pedicular shadow (target area). After negative aspiration for blood, 0.5 mL of the nerve block solution was injected without difficulty or complication. The needle was removed intact. L5 Medial Branch Nerve Block (MBB): The target area for the L5 medial branch is at the junction of the postero-lateral aspect of the superior articular process and the superior, posterior, and medial edge of the sacral ala. Under fluoroscopic guidance, a Quincke needle was inserted until contact was made with os over  the superior postero-lateral aspect of the pedicular shadow (target area). After negative aspiration for blood, 0.5 mL of the nerve block solution was injected without difficulty or complication. The needle was removed intact. S1 Medial Branch Nerve Block (MBB): The target area for the S1 medial branch is at the posterior and inferior 6 o'clock position of the L5-S1 facet joint. Under fluoroscopic guidance, the Quincke needle inserted for the L5 MBB was redirected until contact was made with os over the inferior and postero aspect of the sacrum, at the 6 o' clock position under the L5-S1 facet joint (Target area). After negative aspiration for blood, 0.5 mL of the nerve block solution was injected without difficulty or complication. The needle was removed intact.  Once the entire  procedure was completed, the treated area was cleaned, making sure to leave some of the prepping solution back to take advantage of its long term bactericidal properties.         Illustration of the posterior view of the lumbar spine and the posterior neural structures. Laminae of L2 through S1 are labeled. DPRL5, dorsal primary ramus of L5; DPRS1, dorsal primary ramus of S1; DPR3, dorsal primary ramus of L3; FJ, facet (zygapophyseal) joint L3-L4; I, inferior articular process of L4; LB1, lateral branch of dorsal primary ramus of L1; IAB, inferior articular branches from L3 medial branch (supplies L4-L5 facet joint); IBP, intermediate branch plexus; MB3, medial branch of dorsal primary ramus of L3; NR3, third lumbar nerve root; S, superior articular process of L5; SAB, superior articular branches from L4 (supplies L4-5 facet joint also); TP3, transverse process of L3.   Facet Joint Innervation (* possible contribution)  L1-2 T12, L1 (L2*)  Medial Branch  L2-3 L1, L2 (L3*)         "          "  L3-4 L2, L3 (L4*)         "          "  L4-5 L3, L4 (L5*)         "          "  L5-S1 L4, L5, S1          "          "    Vitals:   10/05/22 0848  BP: (!) 142/84  Pulse: 84  Temp: (!) 97 F (36.1 C)  TempSrc: Temporal  SpO2: 94%  Weight: 151 lb (68.5 kg)  Height: 5\' 3"  (1.6 m)     End Time:   hrs.  Imaging Guidance (Spinal):          Type of Imaging Technique: Fluoroscopy Guidance (Spinal) Indication(s): Assistance in needle guidance and placement for procedures requiring needle placement in or near specific anatomical locations not easily accessible without such assistance. Exposure Time: Please see nurses notes. Contrast: None used. Fluoroscopic Guidance: I was personally present during the use of fluoroscopy. "Tunnel Vision Technique" used to obtain the best possible view of the target area. Parallax error corrected before commencing the procedure. "Direction-depth-direction" technique  used to introduce the needle under continuous pulsed fluoroscopy. Once target was reached, antero-posterior, oblique, and lateral fluoroscopic projection used confirm needle placement in all planes. Images permanently stored in EMR. Interpretation: No contrast injected. I personally interpreted the imaging intraoperatively. Adequate needle placement confirmed in multiple planes. Permanent images saved into the patient's record.  Post-operative Assessment:  Post-procedure Vital Signs:  Pulse/HCG Rate: 84  Temp: (!) 97 F (36.1 C) Resp:  BP: (!) 142/84 SpO2: 94 %  EBL: None  Complications: No immediate post-treatment complications observed by team, or reported by patient.  Note: The patient tolerated the entire procedure well. A repeat set of vitals were taken after the procedure and the patient was kept under observation following institutional policy, for this type of procedure. Post-procedural neurological assessment was performed, showing return to baseline, prior to discharge. The patient was provided with post-procedure discharge instructions, including a section on how to identify potential problems. Should any problems arise concerning this procedure, the patient was given instructions to immediately contact us, at any time, without hesitation. In any case, we plan to contact the patient by telephone for a follow-up status report regarding this interventional procedure.  Comments:  No additional relevant information.  Plan of Care (POC)  Orders:  Orders Placed This Encounter  Procedures   DG PAIN CLIN[facility] C-ARM 1-60 MIN NO REPORT    Intraoperative interpretation by procedural physician at [facility].    Standing Status:   Standing    Number of Occurrences:   1    Order Specific Question:   Reason for exam:    Answer:   Assistance in needle guidance and placement for procedures requiring needle placement in or near specific anatomical locations not easily accessible  without such assistance.   Chronic Opioid Analgesic:  {There is no content from the last Subjective section.}   Medications ordered for procedure: Meds ordered this encounter  Medications   lidocaine (XYLOCAINE) 2 % (with pres) injection 400 mg   dexamethasone (DECADRON) injection 20 mg   ropivacaine (PF) 2 mg/mL (0.2%) (NAROPIN) injection 9 mL   ropivacaine (PF) 2 mg/mL (0.2%) (NAROPIN) injection 9 mL   Medications administered: Eileen Maldonado had no medications administered during this visit.  See the medical record for exact dosing, route, and time of administration.  Follow-up plan:   Return in about 4 weeks (around 11/02/2022) for Post Procedure Evaluation, in person.       L4-L5 ESI 12/13/2021, 08/15/22      Recent Visits Date Type Provider Dept  09/13/22 Office Visit Edward Jolly, MD [facility]  08/15/22 Procedure visit Edward Jolly, MD [facility]  07/26/22 Office Visit Edward Jolly, MD [facility]  Showing recent visits within past 90 days and meeting all other requirements Today's Visits Date Type Provider Dept  10/05/22 Procedure visit Edward Jolly, MD [facility]  Showing today's visits and meeting all other requirements Future Appointments No visits were found meeting these conditions. Showing future appointments within next 90 days and meeting all other requirements  Disposition: Discharge home  Discharge (Date  Time): 10/05/2022;   hrs.   Primary Care Physician: Jerl Mina, MD Location: [facility] Note by: Edward Jolly, MD (TTS technology used. I apologize for any typographical errors that were not detected and corrected.) Date: 10/05/2022; Time: 8:53 AM  Disclaimer:  Medicine is not an Visual merchandiser. The only guarantee in medicine is that nothing is guaranteed. It is important to note that the decision to proceed with this intervention was based on the information collected  from the patient. The Data and conclusions were drawn from the patient's questionnaire, the interview, and the physical examination. Because the information was provided in large part by the patient, it cannot be guaranteed that it has not been purposely or unconsciously manipulated. Every effort has been made to obtain as much relevant data as possible for this evaluation. It  is important to note that the conclusions that lead to this procedure are derived in large part from the available data. Always take into account that the treatment will also be dependent on availability of resources and existing treatment guidelines, considered by other Pain Management Practitioners as being common knowledge and practice, at the time of the intervention. For Medico-Legal purposes, it is also important to point out that variation in procedural techniques and pharmacological choices are the acceptable norm. The indications, contraindications, technique, and results of the above procedure should only be interpreted and judged by a Board-Certified Interventional Pain Specialist with extensive familiarity and expertise in the same exact procedure and technique.

## 2022-10-06 ENCOUNTER — Telehealth: Payer: Self-pay

## 2022-10-06 NOTE — Telephone Encounter (Signed)
Post procedure follow up.  Patient states she is doing good.  

## 2022-11-02 ENCOUNTER — Ambulatory Visit: Payer: Medicare Other | Admitting: Student in an Organized Health Care Education/Training Program

## 2022-11-08 ENCOUNTER — Encounter: Payer: Self-pay | Admitting: Student in an Organized Health Care Education/Training Program

## 2022-11-14 ENCOUNTER — Encounter: Payer: Self-pay | Admitting: Student in an Organized Health Care Education/Training Program

## 2022-11-14 ENCOUNTER — Ambulatory Visit
Payer: Medicare Other | Attending: Student in an Organized Health Care Education/Training Program | Admitting: Student in an Organized Health Care Education/Training Program

## 2022-11-14 VITALS — BP 127/87 | HR 93 | Temp 98.1°F | Resp 16 | Ht 62.0 in | Wt 155.0 lb

## 2022-11-14 DIAGNOSIS — G894 Chronic pain syndrome: Secondary | ICD-10-CM | POA: Diagnosis present

## 2022-11-14 DIAGNOSIS — M47816 Spondylosis without myelopathy or radiculopathy, lumbar region: Secondary | ICD-10-CM | POA: Diagnosis present

## 2022-11-14 NOTE — Patient Instructions (Addendum)
____________________________________________________________________________________________  General Risks and Possible Complications  Patient Responsibilities: It is important that you read this as it is part of your informed consent. It is our duty to inform you of the risks and possible complications associated with treatments offered to you. It is your responsibility as a patient to read this and to ask questions about anything that is not clear or that you believe was not covered in this document.  Patient's Rights: You have the right to refuse treatment. You also have the right to change your mind, even after initially having agreed to have the treatment done. However, under this last option, if you wait until the last second to change your mind, you may be charged for the materials used up to that point.  Introduction: Medicine is not an Visual merchandiser. Everything in Medicine, including the lack of treatment(s), carries the potential for danger, harm, or loss (which is by definition: Risk). In Medicine, a complication is a secondary problem, condition, or disease that can aggravate an already existing one. All treatments carry the risk of possible complications. The fact that a side effects or complications occurs, does not imply that the treatment was conducted incorrectly. It must be clearly understood that these can happen even when everything is done following the highest safety standards.  No treatment: You can choose not to proceed with the proposed treatment alternative. The "PRO(s)" would include: avoiding the risk of complications associated with the therapy. The "CON(s)" would include: not getting any of the treatment benefits. These benefits fall under one of three categories: diagnostic; therapeutic; and/or palliative. Diagnostic benefits include: getting information which can ultimately lead to improvement of the disease or symptom(s). Therapeutic benefits are those associated with  the successful treatment of the disease. Finally, palliative benefits are those related to the decrease of the primary symptoms, without necessarily curing the condition (example: decreasing the pain from a flare-up of a chronic condition, such as incurable terminal cancer).  General Risks and Complications: These are associated to most interventional treatments. They can occur alone, or in combination. They fall under one of the following six (6) categories: no benefit or worsening of symptoms; bleeding; infection; nerve damage; allergic reactions; and/or death. No benefits or worsening of symptoms: In Medicine there are no guarantees, only probabilities. No healthcare provider can ever guarantee that a medical treatment will work, they can only state the probability that it may. Furthermore, there is always the possibility that the condition may worsen, either directly, or indirectly, as a consequence of the treatment. Bleeding: This is more common if the patient is taking a blood thinner, either prescription or over the counter (example: Goody Powders, Fish oil, Aspirin, Garlic, etc.), or if suffering a condition associated with impaired coagulation (example: Hemophilia, cirrhosis of the liver, low platelet counts, etc.). However, even if you do not have one on these, it can still happen. If you have any of these conditions, or take one of these drugs, make sure to notify your treating physician. Infection: This is more common in patients with a compromised immune system, either due to disease (example: diabetes, cancer, human immunodeficiency virus [HIV], etc.), or due to medications or treatments (example: therapies used to treat cancer and rheumatological diseases). However, even if you do not have one on these, it can still happen. If you have any of these conditions, or take one of these drugs, make sure to notify your treating physician. Nerve Damage: This is more common when the treatment is an  invasive one, but it can also happen with the use of medications, such as those used in the treatment of cancer. The damage can occur to small secondary nerves, or to large primary ones, such as those in the spinal cord and brain. This damage may be temporary or permanent and it may lead to impairments that can range from temporary numbness to permanent paralysis and/or brain death. Allergic Reactions: Any time a substance or material comes in contact with our body, there is the possibility of an allergic reaction. These can range from a mild skin rash (contact dermatitis) to a severe systemic reaction (anaphylactic reaction), which can result in death. Death: In general, any medical intervention can result in death, most of the time due to an unforeseen complication. ____________________________________________________________________________________________    ______________________________________________________________________  Preparing for your procedure  Appointments: If you think you may not be able to keep your appointment, call 24-48 hours in advance to cancel. We need time to make it available to others.  During your procedure appointment there will be: No Prescription Refills. No disability issues to discussed. No medication changes or discussions.  Instructions: Food intake: Avoid eating anything solid for at least 8 hours prior to your procedure. Clear liquid intake: You may take clear liquids such as water up to 2 hours prior to your procedure. (No carbonated drinks. No soda.) Transportation: Unless otherwise stated by your physician, bring a driver. Morning Medicines: Except for blood thinners, take all of your other morning medications with a sip of water. Make sure to take your heart and blood pressure medicines. If your blood pressure's lower number is above 100, the case will be rescheduled. Blood thinners: Make sure to stop your blood thinners as instructed.  If you take  a blood thinner, but were not instructed to stop it, call our office [phone] and ask to talk to a nurse. Not stopping a blood thinner prior to certain procedures could lead to serious complications. Diabetics on insulin: Notify the staff so that you can be scheduled 1st case in the morning. If your diabetes requires high dose insulin, take only  of your normal insulin dose the morning of the procedure and notify the staff that you have done so. Preventing infections: Shower with an antibacterial soap the morning of your procedure.  Build-up your immune system: Take 1000 mg of Vitamin C with every meal (3 times a day) the day prior to your procedure. Antibiotics: Inform the nursing staff if you are taking any antibiotics or if you have any conditions that may require antibiotics prior to procedures. (Example: recent joint implants)   Pregnancy: If you are pregnant make sure to notify the nursing staff. Not doing so may result in injury to the fetus, including death.  Sickness: If you have a cold, fever, or any active infections, call and cancel or reschedule your procedure. Receiving steroids while having an infection may result in complications. Arrival: You must be in the facility at least 30 minutes prior to your scheduled procedure. Tardiness: Your scheduled time is also the cutoff time. If you do not arrive at least 15 minutes prior to your procedure, you will be rescheduled.  Children: Do not bring any children with you. Make arrangements to keep them home. Dress appropriately: There is always a possibility that your clothing may get soiled. Avoid long dresses. Valuables: Do not bring any jewelry or valuables.  Reasons to call and reschedule or cancel your procedure: (Following these recommendations will minimize the risk  of a serious complication.) Surgeries: Avoid having procedures within 2 weeks of any surgery. (Avoid for 2 weeks before or after any surgery). Flu Shots: Avoid having  procedures within 2 weeks of a flu shots or . (Avoid for 2 weeks before or after immunizations). Barium: Avoid having a procedure within 7-10 days after having had a radiological study involving the use of radiological contrast. (Myelograms, Barium swallow or enema study). Heart attacks: Avoid any elective procedures or surgeries for the initial 6 months after a "Myocardial Infarction" (Heart Attack). Blood thinners: It is imperative that you stop these medications before procedures. Let us know if you if you take any blood thinner.  Infection: Avoid procedures during or within two weeks of an infection (including chest colds or gastrointestinal problems). Symptoms associated with infections include: Localized redness, fever, chills, night sweats or profuse sweating, burning sensation when voiding, cough, congestion, stuffiness, runny nose, sore throat, diarrhea, nausea, vomiting, cold or Flu symptoms, recent or current infections. It is specially important if the infection is over the area that we intend to treat. Heart and lung problems: Symptoms that may suggest an active cardiopulmonary problem include: cough, chest pain, breathing difficulties or shortness of breath, dizziness, ankle swelling, uncontrolled high or unusually low blood pressure, and/or palpitations. If you are experiencing any of these symptoms, cancel your procedure and contact your primary care physician for an evaluation.  Remember:  Regular Business hours are:  Monday to Thursday 8:00 AM to 4:00 PM  Provider's Schedule: Delano Metz, MD:  Procedure days: Tuesday and Thursday 7:30 AM to 4:00 PM  Edward Jolly, MD:  Procedure days: Monday and Wednesday 7:30 AM to 4:00 PM  ______________________________________________________________________   [facility]  Description: The facets are joints in the spine between the vertebrae.  Like any  joints in the body, facets can become irritated and painful.  Arthritis can also effect the facets.  By injecting steroids and local anesthetic in and around these joints, we can temporarily block the nerve supply to them.  Steroids act directly on irritated nerves and tissues to reduce selling and inflammation which often leads to decreased pain.  Facet blocks may be done anywhere along the spine from the neck to the low back depending upon the location of your pain.   After numbing the skin with local anesthetic (like Novocaine), a small needle is passed onto the facet joints under x-ray guidance.  You may experience a sensation of pressure while this is being done.  The entire block usually lasts about 15-25 minutes.   Conditions which may be treated by facet blocks:  Low back/buttock pain Neck/shoulder pain Certain types of headaches  Preparation for the injection:  Do not eat any solid food or dairy products within 8 hours of your appointment. You may drink clear liquid up to 3 hours before appointment.  Clear liquids include water, black coffee, juice or soda.  No milk or cream please. You may take your regular medication, including pain medications, with a sip of water before your appointment.  Diabetics should hold regular insulin (if taken separately) and take 1/2 normal NPH dose the morning of the procedure.  Carry some sugar containing items with you to your appointment. A driver must accompany you and be prepared to drive you home after your procedure. Bring all your current medications with you. An IV may be inserted and sedation may be given at the discretion of the physician.  A blood pressure cuff, EKG and other monitors will often be applied during the procedure.  Some patients may need to have extra oxygen administered for a short period. You will be asked to provide medical information, including your allergies and medications, prior to the procedure.  We must know immediately if  you are taking blood thinners (like Coumadin/Warfarin) or if you are allergic to IV iodine contrast (dye).  We must know if you could possible be pregnant.  Possible side-effects:  Bleeding from needle site Infection (rare, may require surgery) Nerve injury (rare) Numbness & tingling (temporary) Difficulty urinating (rare, temporary) Spinal headache (a headache worse with upright posture) Light-headedness (temporary) Pain at injection site (serveral days) Decreased blood pressure (rare, temporary) Weakness in arm/leg (temporary) Pressure sensation in back/neck (temporary)   Call if you experience:  Fever/chills associated with headache or increased back/neck pain Headache worsened by an upright position New onset, weakness or numbness of an extremity below the injection site Hives or difficulty breathing (go to the emergency room) Inflammation or drainage at the injection site(s) Severe back/neck pain greater than usual New symptoms which are concerning to you  Please note:  Although the local anesthetic injected can often make your back or neck feel good for several hours after the injection, the pain will likely return. It takes 3-7 days for steroids to work.  You may not notice any pain relief for at least one week.  If effective, we will often do a series of 2-3 injections spaced 3-6 weeks apart to maximally decrease your pain.  After the initial series, you may be a candidate for a more permanent nerve block of the facets.  If you have any questions, please call #[phone]) [phone] [facility]

## 2022-11-14 NOTE — Progress Notes (Signed)
PROVIDER NOTE: Information contained herein reflects review and annotations entered in association with encounter. Interpretation of such information and data should be left to medically-trained personnel. Information provided to patient can be located elsewhere in the medical record under "Patient Instructions". Document created using STT-dictation technology, any transcriptional errors that may result from process are unintentional.    Patient: Eileen Maldonado  Service Category: E/M  Provider: Edward Jolly, MD  DOB: 02-20-1946  DOS: 11/14/2022  Referring Provider: Jerl Mina, MD  MRN: [id number]  Specialty: Interventional Pain Management  PCP: Jerl Mina, MD  Type: Established Patient  Setting: Ambulatory outpatient    Location: Office  Delivery: Face-to-face     HPI  Eileen Maldonado, a 77 y.o. year old female, is here today because of her Lumbar facet arthropathy [M47.816]. Eileen Maldonado's primary complain today is Back Pain (buttocks)  Pertinent problems: Eileen Maldonado has Rheumatoid arthritis (HCC); S/P total knee arthroplasty; Chronic bilateral low back pain with bilateral sciatica; Spinal stenosis, lumbar region, with neurogenic claudication; Chronic radicular lumbar pain; and Lumbar spondylosis on their pertinent problem list. Pain Assessment: Severity of Chronic pain is reported as a 5 /10. Location: Back Lower/both buttocks and thighs. Onset: More than a month ago. Quality: Aching. Timing: Constant. Modifying factor(s): Diclofenac tablets. Vitals:  height is 5\' 2"  (1.575 m) and weight is 155 lb (70.3 kg). Her temporal temperature is 98.1 F (36.7 C). Her blood pressure is 127/87 and her pulse is 93. Her respiration is 16 and oxygen saturation is 98%.  BMI: Estimated body mass index is 28.35 kg/m as calculated from the following:   Height as of this encounter: 5\' 2"  (1.575 m).   Weight as of this encounter: 155 lb (70.3 kg). Last encounter: 09/13/2022. Last procedure:  10/05/2022.  Reason for encounter: post-procedure evaluation and assessment.    Post-procedure evaluation   Procedure: Lumbar Facet, Medial Branch Block(s) #1  Laterality: Bilateral  Level: L3, L4, and L5 Medial Branch Level(s). Injecting these levels blocks the L3-4 and L4-5 lumbar facet joints. Imaging: Fluoroscopic guidance         Anesthesia: Local anesthesia (1-2% Lidocaine) Anxiolysis: Performed by: Edward Jolly, MD  Primary Purpose: Diagnostic/Therapeutic Indications: Low back pain severe enough to impact quality of life or function. 1. Lumbar facet arthropathy   2. Lumbar spondylosis   3. Chronic pain syndrome    NAS-11 Pain score:   Pre-procedure: 7 /10   Post-procedure: 0-No pain/10      Effectiveness:  Initial hour after procedure: 80 %  Subsequent 4-6 hours post-procedure: 80 %  Analgesia past initial 6 hours: 50 %  Ongoing improvement:  Analgesic:  50% Function: Back to baseline ROM: Back to baseline   ROS  Constitutional: Denies any fever or chills Gastrointestinal: No reported hemesis, hematochezia, vomiting, or acute GI distress Musculoskeletal:  low back pain Neurological: No reported episodes of acute onset apraxia, aphasia, dysarthria, agnosia, amnesia, paralysis, loss of coordination, or loss of consciousness  Medication Review  Abatacept, Butenafine HCl, Dextran 70-Hypromellose, Vitamin D3, albuterol, citalopram, conjugated estrogens, diclofenac, fluticasone, fluticasone furoate-vilanterol, gabapentin, hydrochlorothiazide, leflunomide, loperamide, pregabalin, and tiZANidine  History Review  Allergy: Eileen Maldonado is allergic to amlodipine, lisinopril, celebrex [celecoxib], methotrexate derivatives, bupropion, doxycycline, etanercept, and latex. Drug: Eileen Maldonado  reports no history of drug use. Alcohol:  reports no history of alcohol use. Tobacco:  reports that she has never smoked. She has never used smokeless tobacco. Social: Eileen Maldonado  reports that  she has never smoked. She has  never used smokeless tobacco. She reports that she does not drink alcohol and does not use drugs. Medical:  has a past medical history of Breast cancer (HCC), Collagen vascular disease (HCC), Hives, Hypertension, ILD (interstitial lung disease) (HCC), Pneumonia, PVD (peripheral vascular disease) (HCC), RA (rheumatoid arthritis) (HCC), Raynaud's phenomenon, and Right thyroid nodule. Surgical: Eileen Maldonado  has a past surgical history that includes appy; breast biopsy; hernia repair; Hernia repair; Knee Arthroplasty (Left, 07/06/2016); Breast surgery (Left); Appendectomy; Hysteroscopy with D & C (N/A, 12/22/2017); and Colonoscopy (N/A, 04/13/2022). Family: family history includes Breast cancer in her mother; Hypertension in her father and mother; Stroke in her father.  Laboratory Chemistry Profile   Renal Lab Results  Component Value Date   BUN 19 12/19/2017   CREATININE 1.06 (H) 12/19/2017   GFRAA 59 (L) 12/19/2017   GFRNONAA 51 (L) 12/19/2017    Hepatic Lab Results  Component Value Date   AST 25 06/22/2016   ALT 21 06/22/2016   ALBUMIN 4.1 06/22/2016   ALKPHOS 76 06/22/2016   LIPASE 22 01/31/2016    Electrolytes Lab Results  Component Value Date   NA 138 12/22/2017   K 3.3 (L) 12/22/2017   CL 104 12/19/2017   CALCIUM 9.1 12/19/2017    Bone No results found for: "VD25OH", "VD125OH2TOT", "IH4742VZ5", "GL8756EP3", "25OHVITD1", "25OHVITD2", "25OHVITD3", "TESTOFREE", "TESTOSTERONE"  Inflammation (CRP: Acute Phase) (ESR: Chronic Phase) Lab Results  Component Value Date   CRP <0.8 06/22/2016   ESRSEDRATE 11 06/22/2016   LATICACIDVEN 2.3 (HH) 01/31/2016         Note: Above Lab results reviewed.    Physical Exam  General appearance: Well nourished, well developed, and well hydrated. In no apparent acute distress Mental status: Alert, oriented x 3 (person, place, & time)       Respiratory: No evidence of acute respiratory distress Eyes: PERLA Vitals:  BP 127/87   Pulse 93   Temp 98.1 F (36.7 C) (Temporal)   Resp 16   Ht 5\' 2"  (1.575 m)   Wt 155 lb (70.3 kg)   SpO2 98%   BMI 28.35 kg/m  BMI: Estimated body mass index is 28.35 kg/m as calculated from the following:   Height as of this encounter: 5\' 2"  (1.575 m).   Weight as of this encounter: 155 lb (70.3 kg). Ideal: Ideal body weight: 50.1 kg (110 lb 7.2 oz) Adjusted ideal body weight: 58.2 kg (128 lb 4.3 oz)  Lumbar Spine Area Exam  Skin & Axial Inspection: No masses, redness, or swelling Alignment: Symmetrical Functional ROM: Pain restricted ROM       Stability: No instability detected Muscle Tone/Strength: Functionally intact. No obvious neuro-muscular anomalies detected. Sensory (Neurological): Neurogenic pain pattern and facet mediated Palpation: No palpable anomalies       Positive pain with lumbar extension and facet loading   Gait & Posture Assessment  Ambulation: Limited Gait: Antalgic gait (limping) Posture: Difficulty standing up straight, due to pain   Lower Extremity Exam      Side: Right lower extremity   Side: Left lower extremity  Stability: No instability observed           Stability: No instability observed          Skin & Extremity Inspection: Skin color, temperature, and hair growth are WNL. No peripheral edema or cyanosis. No masses, redness, swelling, asymmetry, or associated skin lesions. No contractures.   Skin & Extremity Inspection: Skin color, temperature, and hair growth are WNL. No peripheral edema or cyanosis.  No masses, redness, swelling, asymmetry, or associated skin lesions. No contractures.  Functional ROM: Pain restricted ROM for hip and knee joints           Functional ROM: Pain restricted ROM for hip and knee joints          Muscle Tone/Strength: Functionally intact. No obvious neuro-muscular anomalies detected.   Muscle Tone/Strength: Functionally intact. No obvious neuro-muscular anomalies detected.  Sensory (Neurological): Neurogenic  pain pattern         Sensory (Neurological): Neurogenic pain pattern        DTR: Patellar: deferred today Achilles: deferred today Plantar: deferred today   DTR: Patellar: deferred today Achilles: deferred today Plantar: deferred today  Palpation: No palpable anomalies   Palpation: No palpable anomalies        Assessment   Diagnosis Status  1. Lumbar facet arthropathy   2. Lumbar spondylosis   3. Chronic pain syndrome    Responding Responding Controlled    Plan of Care  Positive response to lumbar diagnostic facet medial branch nerve block #1.  We discussed repeating block #2 and then considering lumbar radiofrequency ablation.  Orders:  Orders Placed This Encounter  Procedures   LUMBAR FACET(MEDIAL BRANCH NERVE BLOCK) MBNB    Standing Status:   Future    Standing Expiration Date:   02/14/2023    Scheduling Instructions:     Procedure: Lumbar facet block (AKA.: Lumbosacral medial branch nerve block)     Side: Bilateral     Level: L3-4, L4-5, Facets (, L3, L4, L5, Medial Branch)     Sedation: PO Valium     Timeframe: ASAA    Order Specific Question:   Where will this procedure be performed?    Answer:   [facility]   Follow-up plan:   Return in about 3 weeks (around 12/05/2022) for B/L L3, 4, 5 MBNB #2, in clinic (PO Valium).      L4-L5 ESI 12/13/2021, 08/15/22; B/L L3,4,5 MBNB 10/05/22      Recent Visits Date Type Provider Dept  10/05/22 Procedure visit Edward Jolly, MD [facility]  09/13/22 Office Visit Edward Jolly, MD [facility]  Showing recent visits within past 90 days and meeting all other requirements Today's Visits Date Type Provider Dept  11/14/22 Office Visit Edward Jolly, MD [facility]  Showing today's visits and meeting all other requirements Future Appointments No visits were found meeting these conditions. Showing future appointments within next 90 days and meeting all other requirements  I discussed  the assessment and treatment plan with the patient. The patient was provided an opportunity to ask questions and all were answered. The patient agreed with the plan and demonstrated an understanding of the instructions.  Patient advised to call back or seek an in-person evaluation if the symptoms or condition worsens.  Duration of encounter: .  Total time on encounter, as per AMA guidelines included both the face-to-face and non-face-to-face time personally spent by the physician and/or other qualified health care professional(s) on the day of the encounter (includes time in activities that require the physician or other qualified health care professional and does not include time in activities normally performed by clinical staff). Physician's time may include the following activities when performed: Preparing to see the patient (e.g., pre-charting review of records, searching for previously ordered imaging, lab work, and nerve conduction tests) Review of prior analgesic pharmacotherapies. Reviewing PMP Interpreting ordered tests (e.g., lab work, imaging, nerve conduction tests) Performing post-procedure evaluations,  including interpretation of diagnostic procedures Obtaining and/or reviewing separately obtained history Performing a medically appropriate examination and/or evaluation Counseling and educating the patient/family/caregiver Ordering medications, tests, or procedures Referring and communicating with other health care professionals (when not separately reported) Documenting clinical information in the electronic or other health record Independently interpreting results (not separately reported) and communicating results to the patient/ family/caregiver Care coordination (not separately reported)  Note by: Edward Jolly, MD Date: 11/14/2022; Time: 2:20 PM

## 2022-11-17 ENCOUNTER — Other Ambulatory Visit: Payer: Self-pay | Admitting: Specialist

## 2022-11-17 DIAGNOSIS — J849 Interstitial pulmonary disease, unspecified: Secondary | ICD-10-CM

## 2022-11-25 ENCOUNTER — Ambulatory Visit
Admission: RE | Admit: 2022-11-25 | Discharge: 2022-11-25 | Disposition: A | Discharge status: Home / Self Care | Payer: Medicare Other | Source: Ambulatory Visit | Attending: Specialist | Admitting: Specialist

## 2022-11-25 DIAGNOSIS — J849 Interstitial pulmonary disease, unspecified: Secondary | ICD-10-CM | POA: Diagnosis not present

## 2023-02-14 ENCOUNTER — Other Ambulatory Visit: Payer: Self-pay | Admitting: Student in an Organized Health Care Education/Training Program

## 2023-02-14 ENCOUNTER — Encounter: Payer: Self-pay | Admitting: *Deleted

## 2023-02-14 DIAGNOSIS — M47816 Spondylosis without myelopathy or radiculopathy, lumbar region: Secondary | ICD-10-CM

## 2023-04-17 ENCOUNTER — Ambulatory Visit
Admission: RE | Admit: 2023-04-17 | Discharge: 2023-04-17 | Disposition: A | Discharge status: Home / Self Care | Payer: Medicare Other | Source: Ambulatory Visit | Attending: Student in an Organized Health Care Education/Training Program | Admitting: Student in an Organized Health Care Education/Training Program

## 2023-04-17 ENCOUNTER — Ambulatory Visit
Payer: Medicare Other | Attending: Student in an Organized Health Care Education/Training Program | Admitting: Student in an Organized Health Care Education/Training Program

## 2023-04-17 ENCOUNTER — Encounter: Payer: Self-pay | Admitting: Student in an Organized Health Care Education/Training Program

## 2023-04-17 DIAGNOSIS — M47816 Spondylosis without myelopathy or radiculopathy, lumbar region: Secondary | ICD-10-CM | POA: Diagnosis not present

## 2023-04-17 MED ORDER — ROPIVACAINE HCL 2 MG/ML IJ SOLN
INTRAMUSCULAR | Status: AC
  Filled 2023-04-17: qty 20

## 2023-04-17 MED ORDER — LIDOCAINE HCL 2 % IJ SOLN
INTRAMUSCULAR | Status: AC
Start: 2023-04-17 — End: ?
  Filled 2023-04-17: qty 20

## 2023-04-17 MED ORDER — ROPIVACAINE HCL 2 MG/ML IJ SOLN
18.0000 mL | Freq: Once | INTRAMUSCULAR | Status: AC
  Administered 2023-04-17: 18 mL via PERINEURAL

## 2023-04-17 MED ORDER — DEXAMETHASONE SODIUM PHOSPHATE 10 MG/ML IJ SOLN
20.0000 mg | Freq: Once | INTRAMUSCULAR | Status: AC
  Administered 2023-04-17: 20 mg

## 2023-04-17 MED ORDER — LIDOCAINE HCL 2 % IJ SOLN
20.0000 mL | Freq: Once | INTRAMUSCULAR | Status: AC
  Administered 2023-04-17: 400 mg

## 2023-04-17 MED ORDER — DEXAMETHASONE SODIUM PHOSPHATE 10 MG/ML IJ SOLN
INTRAMUSCULAR | Status: AC
  Filled 2023-04-17: qty 2

## 2023-04-17 MED ORDER — DIAZEPAM 5 MG PO TABS
5.0000 mg | ORAL_TABLET | ORAL | Status: AC
  Administered 2023-04-17: 5 mg via ORAL

## 2023-04-17 MED ORDER — DIAZEPAM 5 MG PO TABS
ORAL_TABLET | ORAL | Status: AC
  Filled 2023-04-17: qty 1

## 2023-04-17 NOTE — Progress Notes (Signed)
Safety precautions to be maintained throughout the outpatient stay will include: orient to surroundings, keep bed in low position, maintain call bell within reach at all times, provide assistance with transfer out of bed and ambulation.  

## 2023-04-17 NOTE — Patient Instructions (Signed)
Pain Management Discharge Instructions  General Discharge Instructions :  If you need to reach your doctor call: Monday-Friday 8:00 am - 4:00 pm at [phone] or toll free [phone].  After clinic hours [phone] to have operator reach doctor.  Bring all of your medication bottles to all your appointments in the pain clinic.  To cancel or reschedule your appointment with Pain Management please remember to call 24 hours in advance to avoid a fee.  Refer to the educational materials which you have been given on: General Risks, I had my Procedure. Discharge Instructions, Post Sedation.  Post Procedure Instructions:  The drugs you were given will stay in your system until tomorrow, so for the next 24 hours you should not drive, make any legal decisions or drink any alcoholic beverages.  You may eat anything you prefer, but it is better to start with liquids then soups and crackers, and gradually work up to solid foods.  Please notify your doctor immediately if you have any unusual bleeding, trouble breathing or pain that is not related to your normal pain.  Depending on the type of procedure that was done, some parts of your body may feel week and/or numb.  This usually clears up by tonight or the next day.  Walk with the use of an assistive device or accompanied by an adult for the 24 hours.  You may use ice on the affected area for the first 24 hours.  Put ice in a Ziploc bag and cover with a towel and place against area 15 minutes on 15 minutes off.  You may switch to heat after 24 hours.Facet Blocks Patient Information  Description: The facets are joints in the spine between the vertebrae.  Like any joints in the body, facets can become irritated and painful.  Arthritis can also effect the facets.  By injecting steroids and local anesthetic in and around these joints, we can temporarily block the nerve supply to them.  Steroids act directly on irritated nerves and tissues to  reduce selling and inflammation which often leads to decreased pain.  Facet blocks may be done anywhere along the spine from the neck to the low back depending upon the location of your pain.   After numbing the skin with local anesthetic (like Novocaine), a small needle is passed onto the facet joints under x-ray guidance.  You may experience a sensation of pressure while this is being done.  The entire block usually lasts about 15-25 minutes.   Conditions which may be treated by facet blocks:  Low back/buttock pain Neck/shoulder pain Certain types of headaches  Preparation for the injection:  Do not eat any solid food or dairy products within 8 hours of your appointment. You may drink clear liquid up to 3 hours before appointment.  Clear liquids include water, black coffee, juice or soda.  No milk or cream please. You may take your regular medication, including pain medications, with a sip of water before your appointment.  Diabetics should hold regular insulin (if taken separately) and take 1/2 normal NPH dose the morning of the procedure.  Carry some sugar containing items with you to your appointment. A driver must accompany you and be prepared to drive you home after your procedure. Bring all your current medications with you. An IV may be inserted and sedation may be given at the discretion of the physician. A blood pressure cuff, EKG and other monitors will often be applied during the procedure.  Some patients may need to   have extra oxygen administered for a short period. You will be asked to provide medical information, including your allergies and medications, prior to the procedure.  We must know immediately if you are taking blood thinners (like Coumadin/Warfarin) or if you are allergic to IV iodine contrast (dye).  We must know if you could possible be pregnant.  Possible side-effects:  Bleeding from needle site Infection (rare, may require surgery) Nerve injury (rare) Numbness  & tingling (temporary) Difficulty urinating (rare, temporary) Spinal headache (a headache worse with upright posture) Light-headedness (temporary) Pain at injection site (serveral days) Decreased blood pressure (rare, temporary) Weakness in arm/leg (temporary) Pressure sensation in back/neck (temporary)   Call if you experience:  Fever/chills associated with headache or increased back/neck pain Headache worsened by an upright position New onset, weakness or numbness of an extremity below the injection site Hives or difficulty breathing (go to the emergency room) Inflammation or drainage at the injection site(s) Severe back/neck pain greater than usual New symptoms which are concerning to you  Please note:  Although the local anesthetic injected can often make your back or neck feel good for several hours after the injection, the pain will likely return. It takes 3-7 days for steroids to work.  You may not notice any pain relief for at least one week.  If effective, we will often do a series of 2-3 injections spaced 3-6 weeks apart to maximally decrease your pain.  After the initial series, you may be a candidate for a more permanent nerve block of the facets.  If you have any questions, please call [phone] [facility] 

## 2023-04-17 NOTE — Progress Notes (Signed)
PROVIDER NOTE: Interpretation of information contained herein should be left to medically-trained personnel. Specific patient instructions are provided elsewhere under "Patient Instructions" section of medical record. This document was created in part using STT-dictation technology, any transcriptional errors that may result from this process are unintentional.  Patient: Eileen Maldonado Type: Established DOB: 09/01/1945 MRN: [id number] PCP: Jerl Mina, MD  Service: Procedure DOS: 04/17/2023 Setting: Ambulatory Location: Ambulatory outpatient facility Delivery: Face-to-face Provider: Edward Jolly, MD Specialty: Interventional Pain Management Specialty designation: 09 Location: Outpatient facility Ref. Prov.: Edward Jolly, MD       Interventional Therapy   Procedure: Lumbar Facet, Medial Branch Block(s) #2  Laterality: Bilateral  Level: L3, L4, and L5 Medial Branch Level(s). Injecting these levels blocks the L3-4 and L4-5 lumbar facet joints. Imaging: Fluoroscopic guidance         Anesthesia: Local anesthesia (1-2% Lidocaine) Anxiolysis: PO Valium Performed by: Edward Jolly, MD  Primary Purpose: Diagnostic/Therapeutic Indications: Low back pain severe enough to impact quality of life or function. 1. Lumbar facet arthropathy    NAS-11 Pain score:   Pre-procedure: 7 /10   Post-procedure: 7 /10     Position / Prep / Materials:  Position: Prone  Prep solution: DuraPrep (Iodine Povacrylex [0.7% available iodine] and Isopropyl Alcohol, 74% w/w) Area Prepped: Posterolateral Lumbosacral Spine (Wide prep: From the lower border of the scapula down to the end of the tailbone and from flank to flank.)  Materials:  Tray: Block Needle(s):  Type: Spinal  Gauge (G): 22  Length: 3.5-in Qty: 2      H&P (Pre-op Assessment):  Eileen Maldonado is a 78 y.o. (year old), female patient, seen today for interventional treatment. She  has a past surgical history that includes appy; breast biopsy;  hernia repair; Hernia repair; Knee Arthroplasty (Left, 07/06/2016); Breast surgery (Left); Appendectomy; Hysteroscopy with D & C (N/A, 12/22/2017); and Colonoscopy (N/A, 04/13/2022). Eileen Maldonado has a current medication list which includes the following prescription(s): abatacept, albuterol, butenafine hcl, vitamin d3, citalopram, conjugated estrogens, dextran 70-hypromellose, diclofenac, fluticasone, fluticasone furoate-vilanterol, trelegy ellipta, hydrochlorothiazide, leflunomide, loperamide, metoprolol succinate, and pregabalin. Her primarily concern today is the Back Pain (Lower back worse on left)  Initial Vital Signs:  Pulse/HCG Rate: 84ECG Heart Rate: 75 Temp: 97.6 F (36.4 C) Resp: (!) 98 BP: (!) 151/93 SpO2: 99 %  BMI: Estimated body mass index is 27.28 kg/m as calculated from the following:   Height as of this encounter: 5\' 3"  (1.6 m).   Weight as of this encounter: 154 lb (69.9 kg).  Risk Assessment: Allergies: Reviewed. She is allergic to amlodipine, lisinopril, celebrex [celecoxib], methotrexate derivatives, bupropion, doxycycline, etanercept, and latex.  Allergy Precautions: None required Coagulopathies: Reviewed. None identified.  Blood-thinner therapy: None at this time Active Infection(s): Reviewed. None identified. Eileen Maldonado is afebrile  Site Confirmation: Eileen Maldonado was asked to confirm the procedure and laterality before marking the site Procedure checklist: Completed Consent: Before the procedure and under the influence of no sedative(s), amnesic(s), or anxiolytics, the patient was informed of the treatment options, risks and possible complications. To fulfill our ethical and legal obligations, as recommended by the American Medical Association's Code of Ethics, I have informed the patient of my clinical impression; the nature and purpose of the treatment or procedure; the risks, benefits, and possible complications of the intervention; the alternatives, including doing  nothing; the risk(s) and benefit(s) of the alternative treatment(s) or procedure(s); and the risk(s) and benefit(s) of doing nothing. The patient was provided information about the  general risks and possible complications associated with the procedure. These may include, but are not limited to: failure to achieve desired goals, infection, bleeding, organ or nerve damage, allergic reactions, paralysis, and death. In addition, the patient was informed of those risks and complications associated to Spine-related procedures, such as failure to decrease pain; infection (i.e.: Meningitis, epidural or intraspinal abscess); bleeding (i.e.: epidural hematoma, subarachnoid hemorrhage, or any other type of intraspinal or peri-dural bleeding); organ or nerve damage (i.e.: Any type of peripheral nerve, nerve root, or spinal cord injury) with subsequent damage to sensory, motor, and/or autonomic systems, resulting in permanent pain, numbness, and/or weakness of one or several areas of the body; allergic reactions; (i.e.: anaphylactic reaction); and/or death. Furthermore, the patient was informed of those risks and complications associated with the medications. These include, but are not limited to: allergic reactions (i.e.: anaphylactic or anaphylactoid reaction(s)); adrenal axis suppression; blood sugar elevation that in diabetics may result in ketoacidosis or comma; water retention that in patients with history of congestive heart failure may result in shortness of breath, pulmonary edema, and decompensation with resultant heart failure; weight gain; swelling or edema; medication-induced neural toxicity; particulate matter embolism and blood vessel occlusion with resultant organ, and/or nervous system infarction; and/or aseptic necrosis of one or more joints. Finally, the patient was informed that Medicine is not an exact science; therefore, there is also the possibility of unforeseen or unpredictable risks and/or possible  complications that may result in a catastrophic outcome. The patient indicated having understood very clearly. We have given the patient no guarantees and we have made no promises. Enough time was given to the patient to ask questions, all of which were answered to the patient's satisfaction. Ms. Dimanche has indicated that she wanted to continue with the procedure. Attestation: I, the ordering provider, attest that I have discussed with the patient the benefits, risks, side-effects, alternatives, likelihood of achieving goals, and potential problems during recovery for the procedure that I have provided informed consent. Date  Time: 04/17/2023 10:34 AM   Pre-Procedure Preparation:  Monitoring: As per clinic protocol. Respiration, ETCO2, SpO2, BP, heart rate and rhythm monitor placed and checked for adequate function Safety Precautions: Patient was assessed for positional comfort and pressure points before starting the procedure. Time-out: I initiated and conducted the "Time-out" before starting the procedure, as per protocol. The patient was asked to participate by confirming the accuracy of the "Time Out" information. Verification of the correct person, site, and procedure were performed and confirmed by me, the nursing staff, and the patient. "Time-out" conducted as per Joint Commission's Universal Protocol (UP.01.01.01). Time: 1130 Start Time: 1130 hrs.  Description of Procedure:          Laterality: (see above) Targeted Levels: (see above)  Safety Precautions: Aspiration looking for blood return was conducted prior to all injections. At no point did we inject any substances, as a needle was being advanced. Before injecting, the patient was told to immediately notify me if she was experiencing any new onset of "ringing in the ears, or metallic taste in the mouth". No attempts were made at seeking any paresthesias. Safe injection practices and needle disposal techniques used. Medications properly  checked for expiration dates. SDV (single dose vial) medications used. After the completion of the procedure, all disposable equipment used was discarded in the proper designated medical waste containers. Local Anesthesia: Protocol guidelines were followed. The patient was positioned over the fluoroscopy table. The area was prepped in the usual manner. The time-out  was completed. The target area was identified using fluoroscopy. A 12-in long, straight, sterile hemostat was used with fluoroscopic guidance to locate the targets for each level blocked. Once located, the skin was marked with an approved surgical skin marker. Once all sites were marked, the skin (epidermis, dermis, and hypodermis), as well as deeper tissues (fat, connective tissue and muscle) were infiltrated with a small amount of a short-acting local anesthetic, loaded on a 10cc syringe with a 25G, 1.5-in  Needle. An appropriate amount of time was allowed for local anesthetics to take effect before proceeding to the next step. Local Anesthetic: Lidocaine 2.0% The unused portion of the local anesthetic was discarded in the proper designated containers. Technical description of process:  L3 Medial Branch Nerve Block (MBB): The target area for the L3 medial branch is at the junction of the postero-lateral aspect of the superior articular process and the superior, posterior, and medial edge of the transverse process of L4. Under fluoroscopic guidance, a Quincke needle was inserted until contact was made with os over the superior postero-lateral aspect of the pedicular shadow (target area). After negative aspiration for blood, 2 mL of the nerve block solution was injected without difficulty or complication. The needle was removed intact. L4 Medial Branch Nerve Block (MBB): The target area for the L4 medial branch is at the junction of the postero-lateral aspect of the superior articular process and the superior, posterior, and medial edge of the  transverse process of L5. Under fluoroscopic guidance, a Quincke needle was inserted until contact was made with os over the superior postero-lateral aspect of the pedicular shadow (target area). After negative aspiration for blood, 2mL of the nerve block solution was injected without difficulty or complication. The needle was removed intact. L5 Medial Branch Nerve Block (MBB): The target area for the L5 medial branch is at the junction of the postero-lateral aspect of the superior articular process and the superior, posterior, and medial edge of the sacral ala. Under fluoroscopic guidance, a Quincke needle was inserted until contact was made with os over the superior postero-lateral aspect of the pedicular shadow (target area). After negative aspiration for blood, 2mL of the nerve block solution was injected without difficulty or complication. The needle was removed intact.   Once the entire procedure was completed, the treated area was cleaned, making sure to leave some of the prepping solution back to take advantage of its long term bactericidal properties.         Illustration of the posterior view of the lumbar spine and the posterior neural structures. Laminae of L2 through S1 are labeled. DPRL5, dorsal primary ramus of L5; DPRS1, dorsal primary ramus of S1; DPR3, dorsal primary ramus of L3; FJ, facet (zygapophyseal) joint L3-L4; I, inferior articular process of L4; LB1, lateral branch of dorsal primary ramus of L1; IAB, inferior articular branches from L3 medial branch (supplies L4-L5 facet joint); IBP, intermediate branch plexus; MB3, medial branch of dorsal primary ramus of L3; NR3, third lumbar nerve root; S, superior articular process of L5; SAB, superior articular branches from L4 (supplies L4-5 facet joint also); TP3, transverse process of L3.   Facet Joint Innervation (* possible contribution)  L1-2 T12, L1 (L2*)  Medial Branch  L2-3 L1, L2 (L3*)         "          "  L3-4 L2, L3 (L4*)          "          "  L4-5 L3, L4 (L5*)         "          "  L5-S1 L4, L5, S1          "          "    Vitals:   04/17/23 1041 04/17/23 1129 04/17/23 1134 04/17/23 1141  BP: (!) 151/93 (!) 160/109 (!) 169/97 (!) 159/105  Pulse: 84     Resp: (!) 98 18 17 18   Temp: 97.6 F (36.4 C)     SpO2:  99% 100% 99%  Weight:      Height:         End Time: 1140 hrs.  Imaging Guidance (Spinal):          Type of Imaging Technique: Fluoroscopy Guidance (Spinal) Indication(s): Assistance in needle guidance and placement for procedures requiring needle placement in or near specific anatomical locations not easily accessible without such assistance. Exposure Time: Please see nurses notes. Contrast: None used. Fluoroscopic Guidance: I was personally present during the use of fluoroscopy. "Tunnel Vision Technique" used to obtain the best possible view of the target area. Parallax error corrected before commencing the procedure. "Direction-depth-direction" technique used to introduce the needle under continuous pulsed fluoroscopy. Once target was reached, antero-posterior, oblique, and lateral fluoroscopic projection used confirm needle placement in all planes. Images permanently stored in EMR. Interpretation: No contrast injected. I personally interpreted the imaging intraoperatively. Adequate needle placement confirmed in multiple planes. Permanent images saved into the patient's record.  Post-operative Assessment:  Post-procedure Vital Signs:  Pulse/HCG Rate: 8470 Temp: 97.6 F (36.4 C) Resp: 18 BP: (!) 159/105 SpO2: 99 %  EBL: None  Complications: No immediate post-treatment complications observed by team, or reported by patient.  Note: The patient tolerated the entire procedure well. A repeat set of vitals were taken after the procedure and the patient was kept under observation following institutional policy, for this type of procedure. Post-procedural neurological assessment was performed,  showing return to baseline, prior to discharge. The patient was provided with post-procedure discharge instructions, including a section on how to identify potential problems. Should any problems arise concerning this procedure, the patient was given instructions to immediately contact us, at any time, without hesitation. In any case, we plan to contact the patient by telephone for a follow-up status report regarding this interventional procedure.  Comments:  No additional relevant information.  Plan of Care (POC)  Orders:  Orders Placed This Encounter  Procedures   DG PAIN [facility] C-ARM 1-60 MIN NO REPORT    Intraoperative interpretation by procedural physician at [facility].    Standing Status:   Standing    Number of Occurrences:   1    Reason for exam::   Assistance in needle guidance and placement for procedures requiring needle placement in or near specific anatomical locations not easily accessible without such assistance.    Medications ordered for procedure: Meds ordered this encounter  Medications   lidocaine (XYLOCAINE) 2 % (with pres) injection 400 mg   diazepam (VALIUM) tablet 5 mg    Make sure Flumazenil is available in the pyxis when using this medication. If oversedation occurs, administer 0.2 mg IV over 15 sec. If after 45 sec no response, administer 0.2 mg again over 1 min; may repeat at 1 min intervals; not to exceed 4 doses (1 mg)   dexamethasone (DECADRON) injection 20 mg    This is for a two (2) level block. Use two (  2) syringes and divide content in half.   ropivacaine (PF) 2 mg/mL (0.2%) (NAROPIN) injection 18 mL   Medications administered: We administered lidocaine, diazepam, dexamethasone, and ropivacaine (PF) 2 mg/mL (0.2%).  See the medical record for exact dosing, route, and time of administration.  Follow-up plan:   Return in about 4 weeks (around 05/15/2023) for PPE, VV.       L4-L5 ESI 12/13/2021, 08/15/22; B/L L3,4,5 MBNB 10/05/22, 04/17/23      Recent Visits No visits were found meeting these conditions. Showing recent visits within past 90 days and meeting all other requirements Today's Visits Date Type Provider Dept  04/17/23 Procedure visit Edward Jolly, MD [facility]  Showing today's visits and meeting all other requirements Future Appointments Date Type Provider Dept  05/15/23 Appointment Edward Jolly, MD [facility]  Showing future appointments within next 90 days and meeting all other requirements  Disposition: Discharge home  Discharge (Date  Time): 04/17/2023; 1150 hrs.   Primary Care Physician: Jerl Mina, MD Location: [facility] Note by: Edward Jolly, MD (TTS technology used. I apologize for any typographical errors that were not detected and corrected.) Date: 04/17/2023; Time: 1:12 PM  Disclaimer:  Medicine is not an Visual merchandiser. The only guarantee in medicine is that nothing is guaranteed. It is important to note that the decision to proceed with this intervention was based on the information collected from the patient. The Data and conclusions were drawn from the patient's questionnaire, the interview, and the physical examination. Because the information was provided in large part by the patient, it cannot be guaranteed that it has not been purposely or unconsciously manipulated. Every effort has been made to obtain as much relevant data as possible for this evaluation. It is important to note that the conclusions that lead to this procedure are derived in large part from the available data. Always take into account that the treatment will also be dependent on availability of resources and existing treatment guidelines, considered by other Pain Management Practitioners as being common knowledge and practice, at the time of the intervention. For Medico-Legal purposes, it is also important to point out that variation in procedural techniques and pharmacological  choices are the acceptable norm. The indications, contraindications, technique, and results of the above procedure should only be interpreted and judged by a Board-Certified Interventional Pain Specialist with extensive familiarity and expertise in the same exact procedure and technique.

## 2023-04-18 ENCOUNTER — Telehealth: Payer: Self-pay | Admitting: *Deleted

## 2023-04-18 NOTE — Telephone Encounter (Signed)
No problems post procedure. 

## 2023-05-15 ENCOUNTER — Encounter: Payer: Self-pay | Admitting: Student in an Organized Health Care Education/Training Program

## 2023-05-15 ENCOUNTER — Ambulatory Visit
Payer: Medicare Other | Attending: Student in an Organized Health Care Education/Training Program | Admitting: Student in an Organized Health Care Education/Training Program

## 2023-05-15 DIAGNOSIS — M47816 Spondylosis without myelopathy or radiculopathy, lumbar region: Secondary | ICD-10-CM

## 2023-05-15 DIAGNOSIS — G894 Chronic pain syndrome: Secondary | ICD-10-CM | POA: Diagnosis not present

## 2023-05-15 NOTE — Progress Notes (Signed)
Patient: Eileen Maldonado  Service Category: E/M  Provider: Edward Jolly, MD  DOB: 01-08-1946  DOS: 05/15/2023  Location: Office  MRN: [id number]  Setting: Ambulatory outpatient  Referring Provider: Jerl Mina, MD  Type: Established Patient  Specialty: Interventional Pain Management  PCP: Jerl Mina, MD  Location: Remote location  Delivery: TeleHealth     Virtual Encounter - Pain Management PROVIDER NOTE: Information contained herein reflects review and annotations entered in association with encounter. Interpretation of such information and data should be left to medically-trained personnel. Information provided to patient can be located elsewhere in the medical record under "Patient Instructions". Document created using STT-dictation technology, any transcriptional errors that may result from process are unintentional.    Contact & Pharmacy Preferred: [phone] Home: [phone] (home) Mobile: [phone] (mobile) E-mail: [email or web address]  [facility] [id number] E[place] Maldonado, [place] - [address] [place] [address] Phone: [phone] Fax: [phone]  [facility] - [place], [place] - [address] [place] [phone] Phone: [phone] Fax: [phone]   Pre-screening  [name] offered "in-person" vs "virtual" encounter. She indicated preferring virtual for this encounter.   Reason COVID-19*  Social distancing based on CDC and AMA recommendations.   I contacted Eileen Maldonado on 05/15/2023 via telephone.      I clearly identified myself as Edward Jolly, MD. I verified that I was speaking with the correct person using two identifiers (Name: Eileen Maldonado, and date of birth: [birth year]).  Consent I sought verbal advanced consent from Eileen Maldonado for virtual visit interactions. I informed Eileen Maldonado of possible security and privacy concerns, risks, and limitations associated with  providing "not-in-person" medical evaluation and management services. I also informed Eileen Maldonado of the availability of "in-person" appointments. Finally, I informed her that there would be a charge for the virtual visit and that she could be  personally, fully or partially, financially responsible for it. Eileen Maldonado expressed understanding and agreed to proceed.   Historic Elements   Eileen Maldonado is a 78 y.o. year old, female patient evaluated today after our last contact on 04/17/2023. Eileen Maldonado  has a past medical history of Breast cancer (HCC), Collagen vascular disease (HCC), Hives, Hypertension, ILD (interstitial lung disease) (HCC), Pneumonia, PVD (peripheral vascular disease) (HCC), RA (rheumatoid arthritis) (HCC), Raynaud's phenomenon, and Right thyroid nodule. She also  has a past surgical history that includes appy; breast biopsy; hernia repair; Hernia repair; Knee Arthroplasty (Left, 07/06/2016); Breast surgery (Left); Appendectomy; Hysteroscopy with D & C (N/A, 12/22/2017); and Colonoscopy (N/A, 04/13/2022). Eileen Maldonado has a current medication list which includes the following prescription(s): abatacept, albuterol, butenafine hcl, vitamin d3, conjugated estrogens, dextran 70-hypromellose, diclofenac, fluticasone, fluticasone furoate-vilanterol, trelegy ellipta, hydrochlorothiazide, leflunomide, loperamide, metoprolol succinate, citalopram, and pregabalin. She  reports that she has never smoked. She has never used smokeless tobacco. She reports that she does not drink alcohol and does not use drugs. Eileen Maldonado is allergic to amlodipine, lisinopril, celebrex [celecoxib], methotrexate derivatives, bupropion, doxycycline, etanercept, and latex.  BMI: Estimated body mass index is 27.28 kg/m as calculated from the following:   Height as of 04/17/23: 5\' 3"  (1.6 m).   Weight as of 04/17/23: 154 lb (69.9 kg). Last encounter: 11/14/2022. Last procedure: 04/17/2023.  HPI  Today, she is being  contacted for a post-procedure assessment.   Post-procedure evaluation    Procedure: Lumbar Facet, Medial Branch Block(s) #2  Laterality: Bilateral  Level: L3, L4, and  L5 Medial Branch Level(s). Injecting these levels blocks the L3-4 and L4-5 lumbar facet joints. Imaging: Fluoroscopic guidance         Anesthesia: Local anesthesia (1-2% Lidocaine) Anxiolysis: PO Valium Performed by: Edward Jolly, MD  Primary Purpose: Diagnostic/Therapeutic Indications: Low back pain severe enough to impact quality of life or function. 1. Lumbar facet arthropathy    NAS-11 Pain score:   Pre-procedure: 7 /10   Post-procedure: 7 /10     Effectiveness:  Initial hour after procedure:  (asleep, does not remember)  Subsequent 4-6 hours post-procedure:  (asleep, does not remember)  Analgesia past initial 6 hours: 80% Ongoing improvement:  Analgesic:  80% for 4-5 days then gradually back to baseline Function: Somewhat improved ROM: Somewhat improved   Laboratory Chemistry Profile   Renal Lab Results  Component Value Date   BUN 19 12/19/2017   CREATININE 1.06 (H) 12/19/2017   GFRAA 59 (L) 12/19/2017   GFRNONAA 51 (L) 12/19/2017    Hepatic Lab Results  Component Value Date   AST 25 06/22/2016   ALT 21 06/22/2016   ALBUMIN 4.1 06/22/2016   ALKPHOS 76 06/22/2016   LIPASE 22 01/31/2016    Electrolytes Lab Results  Component Value Date   NA 138 12/22/2017   K 3.3 (L) 12/22/2017   CL 104 12/19/2017   CALCIUM 9.1 12/19/2017    Bone No results found for: "VD25OH", "VD125OH2TOT", "GN5621HY8", "MV7846NG2", "25OHVITD1", "25OHVITD2", "25OHVITD3", "TESTOFREE", "TESTOSTERONE"  Inflammation (CRP: Acute Phase) (ESR: Chronic Phase) Lab Results  Component Value Date   CRP <0.8 06/22/2016   ESRSEDRATE 11 06/22/2016   LATICACIDVEN 2.3 (HH) 01/31/2016         Note: Above Lab results reviewed.   Assessment  The primary encounter diagnosis was Lumbar facet arthropathy. Diagnoses of Lumbar  spondylosis and Chronic pain syndrome were also pertinent to this visit.  Plan of Care  Patient is status post 2 positive diagnostic lumbar facet medial branch nerve blocks.  Her last nerve block provided her with 80% pain relief for approximately 5 days.  She states that she was able to perform ADLs more comfortably even though she did have some pain.  We discussed next steps which include lumbar radiofrequency ablation for the purpose of obtaining longer-term pain relief.  Risk and benefits reviewed and patient would like to proceed.  Orders:  Orders Placed This Encounter  Procedures   Radiofrequency,Lumbar    Standing Status:   Future    Expected Date:   06/12/2023    Expiration Date:   08/12/2023    Scheduling Instructions:     Side(s): Bilateral     Level(s): L3, L4, L5,  Medial Branch Nerve(s)     Sedation: With Sedation     Scheduling Timeframe: As soon as pre-approved    Where will this procedure be performed?:   [facility]   Follow-up plan:   Return in about 4 weeks (around 06/12/2023) for B/L L3, 4, 5 RFA, in clinic IV Versed.      L4-L5 ESI 12/13/2021, 08/15/22; B/L L3,4,5 MBNB 10/05/22, 04/17/23: both provided 80% pain relief for 5-7 days     Recent Visits Date Type Provider Dept  04/17/23 Procedure visit Edward Jolly, MD [facility]  Showing recent visits within past 90 days and meeting all other requirements Today's Visits Date Type Provider Dept  05/15/23 Office Visit Edward Jolly, MD [facility]  Showing today's visits and meeting all other requirements Future Appointments No visits were found meeting these  conditions. Showing future appointments within next 90 days and meeting all other requirements  I discussed the assessment and treatment plan with the patient. The patient was provided an opportunity to ask questions and all were answered. The patient agreed with the plan and demonstrated an understanding of the  instructions.  Patient advised to call back or seek an in-person evaluation if the symptoms or condition worsens.  Duration of encounter: .  Note by: Edward Jolly, MD Date: 05/15/2023; Time: 3:13 PM

## 2023-06-14 ENCOUNTER — Ambulatory Visit
Admission: RE | Admit: 2023-06-14 | Discharge: 2023-06-14 | Disposition: A | Discharge status: Home / Self Care | Source: Ambulatory Visit | Attending: Student in an Organized Health Care Education/Training Program | Admitting: Student in an Organized Health Care Education/Training Program

## 2023-06-14 ENCOUNTER — Ambulatory Visit
Payer: Medicare Other | Attending: Student in an Organized Health Care Education/Training Program | Admitting: Student in an Organized Health Care Education/Training Program

## 2023-06-14 ENCOUNTER — Encounter: Payer: Self-pay | Admitting: Student in an Organized Health Care Education/Training Program

## 2023-06-14 DIAGNOSIS — M47816 Spondylosis without myelopathy or radiculopathy, lumbar region: Secondary | ICD-10-CM | POA: Insufficient documentation

## 2023-06-14 DIAGNOSIS — G894 Chronic pain syndrome: Secondary | ICD-10-CM | POA: Diagnosis present

## 2023-06-14 MED ORDER — MIDAZOLAM HCL 2 MG/2ML IJ SOLN
INTRAMUSCULAR | Status: AC
  Filled 2023-06-14: qty 2

## 2023-06-14 MED ORDER — DEXAMETHASONE SODIUM PHOSPHATE 10 MG/ML IJ SOLN
20.0000 mg | Freq: Once | INTRAMUSCULAR | Status: AC
  Administered 2023-06-14: 20 mg

## 2023-06-14 MED ORDER — DEXAMETHASONE SODIUM PHOSPHATE 10 MG/ML IJ SOLN
INTRAMUSCULAR | Status: AC
  Filled 2023-06-14: qty 2

## 2023-06-14 MED ORDER — MIDAZOLAM HCL 2 MG/2ML IJ SOLN
0.5000 mg | Freq: Once | INTRAMUSCULAR | Status: AC
  Administered 2023-06-14: 1 mg via INTRAVENOUS

## 2023-06-14 MED ORDER — LACTATED RINGERS IV SOLN: Freq: Once | INTRAVENOUS | Status: AC

## 2023-06-14 MED ORDER — LIDOCAINE HCL 2 % IJ SOLN
INTRAMUSCULAR | Status: AC
  Filled 2023-06-14: qty 20

## 2023-06-14 MED ORDER — ROPIVACAINE HCL 2 MG/ML IJ SOLN
INTRAMUSCULAR | Status: AC
  Filled 2023-06-14: qty 20

## 2023-06-14 MED ORDER — ROPIVACAINE HCL 2 MG/ML IJ SOLN
18.0000 mL | Freq: Once | INTRAMUSCULAR | Status: AC
  Administered 2023-06-14: 18 mL via PERINEURAL

## 2023-06-14 MED ORDER — LIDOCAINE HCL 2 % IJ SOLN
20.0000 mL | Freq: Once | INTRAMUSCULAR | Status: AC
Start: 2023-06-14 — End: 2023-06-14
  Administered 2023-06-14: 400 mg

## 2023-06-14 NOTE — Progress Notes (Signed)
 PROVIDER NOTE: Interpretation of information contained herein should be left to medically-trained personnel. Specific patient instructions are provided elsewhere under "Patient Instructions" section of medical record. This document was created in part using STT-dictation technology, any transcriptional errors that may result from this process are unintentional.  Patient: Eileen Maldonado Type: Established DOB: [birth year] MRN: [id number] PCP: Jerl Mina, MD  Service: Procedure DOS: 06/14/2023 Setting: Ambulatory Location: Ambulatory outpatient facility Delivery: Face-to-face Provider: Edward Jolly, MD Specialty: Interventional Pain Management Specialty designation: 09 Location: Outpatient facility Ref. Prov.: Edward Jolly, MD       Interventional Therapy   Procedure: Lumbar Facet, Medial Branch Radiofrequency Ablation (RFA) #1  Laterality: Bilateral (-50)  Level: L3, L4, and L5 Medial Branch Level(s). These levels will denervate the L3-4 and L4-5 lumbar facet joints.  Imaging: Fluoroscopy-guided         Anesthesia: Local anesthesia (1-2% Lidocaine) Sedation: Minimal Sedation                       DOS: 06/14/2023  Performed by: Edward Jolly, MD  Purpose: Therapeutic/Palliative Indications: Low back pain severe enough to impact quality of life or function. Indications: 1. Lumbar facet arthropathy   2. Lumbar spondylosis   3. Chronic pain syndrome    Eileen Maldonado has been dealing with the above chronic pain for longer than three months and has either failed to respond, was unable to tolerate, or simply did not get enough benefit from other more conservative therapies including, but not limited to: 1. Over-the-counter medications 2. Anti-inflammatory medications 3. Muscle relaxants 4. Membrane stabilizers 5. Opioids 6. Physical therapy and/or chiropractic manipulation 7. Modalities (Heat, ice, etc.) 8. Invasive techniques such as nerve blocks. Eileen Maldonado has attained more than  50% relief of the pain from a series of diagnostic injections conducted in separate occasions.  Pain Score: Pre-procedure: 6 /10 Post-procedure: 0-No pain/10     Position / Prep / Materials:  Position: Prone  Prep solution: ChloraPrep (2% chlorhexidine gluconate and 70% isopropyl alcohol) Prep Area: Entire Lumbosacral Region (Lower back from mid-thoracic region to end of tailbone and from flank to flank.) Materials:  Tray: RFA (Radiofrequency) tray Needle(s):  Type: RFA (Teflon-coated radiofrequency ablation needles) Gauge (G): 22  Length: Regular (10cm) Qty: 6      H&P (Pre-op Assessment):  Eileen Maldonado is a 78 y.o. (year old), female patient, seen today for interventional treatment. She  has a past surgical history that includes appy; breast biopsy; hernia repair; Hernia repair; Knee Arthroplasty (Left, 07/06/2016); Breast surgery (Left); Appendectomy; Hysteroscopy with D & C (N/A, 12/22/2017); and Colonoscopy (N/A, 04/13/2022). Eileen Maldonado has a current medication list which includes the following prescription(s): abatacept, albuterol, butenafine hcl, vitamin d3, conjugated estrogens, dextran 70-hypromellose, diclofenac, fluticasone, fluticasone furoate-vilanterol, trelegy ellipta, hydrochlorothiazide, leflunomide, loperamide, metoprolol succinate, citalopram, and pregabalin. Her primarily concern today is the Back Pain  Initial Vital Signs:  Pulse/HCG Rate: 76ECG Heart Rate: 68 Temp: (!) 97.1 F (36.2 C) Resp: 16 BP: (!) 179/91 SpO2: 98 %  BMI: Estimated body mass index is 27.1 kg/m as calculated from the following:   Height as of this encounter: 5\' 3"  (1.6 m).   Weight as of this encounter: 153 lb (69.4 kg).  Risk Assessment: Allergies: Reviewed. She is allergic to amlodipine, lisinopril, celebrex [celecoxib], methotrexate derivatives, bupropion, doxycycline, etanercept, and latex.  Allergy Precautions: None required Coagulopathies: Reviewed. None identified.  Blood-thinner  therapy: None at this time Active Infection(s): Reviewed. None identified. Eileen Maldonado is afebrile  Site Confirmation: Eileen Maldonado was asked to confirm the procedure and laterality before marking the site Procedure checklist: Completed Consent: Before the procedure and under the influence of no sedative(s), amnesic(s), or anxiolytics, the patient was informed of the treatment options, risks and possible complications. To fulfill our ethical and legal obligations, as recommended by the American Medical Association's Code of Ethics, I have informed the patient of my clinical impression; the nature and purpose of the treatment or procedure; the risks, benefits, and possible complications of the intervention; the alternatives, including doing nothing; the risk(s) and benefit(s) of the alternative treatment(s) or procedure(s); and the risk(s) and benefit(s) of doing nothing. The patient was provided information about the general risks and possible complications associated with the procedure. These may include, but are not limited to: failure to achieve desired goals, infection, bleeding, organ or nerve damage, allergic reactions, paralysis, and death. In addition, the patient was informed of those risks and complications associated to Spine-related procedures, such as failure to decrease pain; infection (i.e.: Meningitis, epidural or intraspinal abscess); bleeding (i.e.: epidural hematoma, subarachnoid hemorrhage, or any other type of intraspinal or peri-dural bleeding); organ or nerve damage (i.e.: Any type of peripheral nerve, nerve root, or spinal cord injury) with subsequent damage to sensory, motor, and/or autonomic systems, resulting in permanent pain, numbness, and/or weakness of one or several areas of the body; allergic reactions; (i.e.: anaphylactic reaction); and/or death. Furthermore, the patient was informed of those risks and complications associated with the medications. These include, but are not  limited to: allergic reactions (i.e.: anaphylactic or anaphylactoid reaction(s)); adrenal axis suppression; blood sugar elevation that in diabetics may result in ketoacidosis or comma; water retention that in patients with history of congestive heart failure may result in shortness of breath, pulmonary edema, and decompensation with resultant heart failure; weight gain; swelling or edema; medication-induced neural toxicity; particulate matter embolism and blood vessel occlusion with resultant organ, and/or nervous system infarction; and/or aseptic necrosis of one or more joints. Finally, the patient was informed that Medicine is not an exact science; therefore, there is also the possibility of unforeseen or unpredictable risks and/or possible complications that may result in a catastrophic outcome. The patient indicated having understood very clearly. We have given the patient no guarantees and we have made no promises. Enough time was given to the patient to ask questions, all of which were answered to the patient's satisfaction. Ms. Pulcini has indicated that she wanted to continue with the procedure. Attestation: I, the ordering provider, attest that I have discussed with the patient the benefits, risks, side-effects, alternatives, likelihood of achieving goals, and potential problems during recovery for the procedure that I have provided informed consent. Date  Time: 06/14/2023  9:37 AM   Pre-Procedure Preparation:  Monitoring: As per clinic protocol. Respiration, ETCO2, SpO2, BP, heart rate and rhythm monitor placed and checked for adequate function Safety Precautions: Patient was assessed for positional comfort and pressure points before starting the procedure. Time-out: I initiated and conducted the "Time-out" before starting the procedure, as per protocol. The patient was asked to participate by confirming the accuracy of the "Time Out" information. Verification of the correct person, site, and  procedure were performed and confirmed by me, the nursing staff, and the patient. "Time-out" conducted as per Joint Commission's Universal Protocol (UP.01.01.01). Time: 1059 Start Time: 1059 hrs.  Description of Procedure:          Laterality: See above. Levels:  See above. Safety Precautions: Aspiration looking for blood return  was conducted prior to all injections. At no point did we inject any substances, as a needle was being advanced. Before injecting, the patient was told to immediately notify me if she was experiencing any new onset of "ringing in the ears, or metallic taste in the mouth". No attempts were made at seeking any paresthesias. Safe injection practices and needle disposal techniques used. Medications properly checked for expiration dates. SDV (single dose vial) medications used. After the completion of the procedure, all disposable equipment used was discarded in the proper designated medical waste containers. Local Anesthesia: Protocol guidelines were followed. The patient was positioned over the fluoroscopy table. The area was prepped in the usual manner. The time-out was completed. The target area was identified using fluoroscopy. A 12-in long, straight, sterile hemostat was used with fluoroscopic guidance to locate the targets for each level blocked. Once located, the skin was marked with an approved surgical skin marker. Once all sites were marked, the skin (epidermis, dermis, and hypodermis), as well as deeper tissues (fat, connective tissue and muscle) were infiltrated with a small amount of a short-acting local anesthetic, loaded on a 10cc syringe with a 25G, 1.5-in  Needle. An appropriate amount of time was allowed for local anesthetics to take effect before proceeding to the next step. Technical description of process:  Radiofrequency Ablation (RFA)  L3 Medial Branch Nerve RFA: The target area for the L3 medial branch is at the junction of the postero-lateral aspect of the  superior articular process and the superior, posterior, and medial edge of the transverse process of L4. Under fluoroscopic guidance, a Radiofrequency needle was inserted until contact was made with os over the superior postero-lateral aspect of the pedicular shadow (target area). Sensory and motor testing was conducted to properly adjust the position of the needle. Once satisfactory placement of the needle was achieved, the numbing solution was slowly injected after negative aspiration for blood. 2.0 mL of the nerve block solution was injected without difficulty or complication. After waiting for at least 3 minutes, the ablation was performed. Once completed, the needle was removed intact. L4 Medial Branch Nerve RFA: The target area for the L4 medial branch is at the junction of the postero-lateral aspect of the superior articular process and the superior, posterior, and medial edge of the transverse process of L5. Under fluoroscopic guidance, a Radiofrequency needle was inserted until contact was made with os over the superior postero-lateral aspect of the pedicular shadow (target area). Sensory and motor testing was conducted to properly adjust the position of the needle. Once satisfactory placement of the needle was achieved, the numbing solution was slowly injected after negative aspiration for blood. 2.0 mL of the nerve block solution was injected without difficulty or complication. After waiting for at least 3 minutes, the ablation was performed. Once completed, the needle was removed intact. L5 Medial Branch Nerve RFA: The target area for the L5 medial branch is at the junction of the postero-lateral aspect of the superior articular process of S1 and the superior, posterior, and medial edge of the sacral ala. Under fluoroscopic guidance, a Radiofrequency needle was inserted until contact was made with os over the superior postero-lateral aspect of the pedicular shadow (target area). Sensory and motor  testing was conducted to properly adjust the position of the needle. Once satisfactory placement of the needle was achieved, the numbing solution was slowly injected after negative aspiration for blood. 2.0 mL of the nerve block solution was injected without difficulty or complication. After waiting  for at least 3 minutes, the ablation was performed. Once completed, the needle was removed intact.  Radiofrequency lesioning (ablation):  Radiofrequency Generator: Medtronic AccurianTM AG 1000 RF Generator Sensory Stimulation Parameters: 50 Hz was used to locate & identify the nerve, making sure that the needle was positioned such that there was no sensory stimulation below 0.3 V or above 0.7 V. Motor Stimulation Parameters: 2 Hz was used to evaluate the motor component. Care was taken not to lesion any nerves that demonstrated motor stimulation of the lower extremities at an output of less than 2.5 times that of the sensory threshold, or a maximum of 2.0 V. Lesioning Technique Parameters: Standard Radiofrequency settings. (Not bipolar or pulsed.) Temperature Settings: 80 degrees C Lesioning time: 60 seconds Stationary intra-operative compliance: Compliant  Once the entire procedure was completed, the treated area was cleaned, making sure to leave some of the prepping solution back to take advantage of its long term bactericidal properties.    Illustration of the posterior view of the lumbar spine and the posterior neural structures. Laminae of L2 through S1 are labeled. DPRL5, dorsal primary ramus of L5; DPRS1, dorsal primary ramus of S1; DPR3, dorsal primary ramus of L3; FJ, facet (zygapophyseal) joint L3-L4; I, inferior articular process of L4; LB1, lateral branch of dorsal primary ramus of L1; IAB, inferior articular branches from L3 medial branch (supplies L4-L5 facet joint); IBP, intermediate branch plexus; MB3, medial branch of dorsal primary ramus of L3; NR3, third lumbar nerve root; S, superior  articular process of L5; SAB, superior articular branches from L4 (supplies L4-5 facet joint also); TP3, transverse process of L3.  Facet Joint Innervation (* possible contribution)  L1-2 T12, L1 (L2*)  Medial Branch  L2-3 L1, L2 (L3*)         "          "  L3-4 L2, L3 (L4*)         "          "  L4-5 L3, L4 (L5*)         "          "  L5-S1 L4, L5, S1          "          "    Vitals:   06/14/23 1110 06/14/23 1116 06/14/23 1120 06/14/23 1129  BP: (!) 149/115 (!) 142/113  (!) 180/91  Pulse:      Resp: 17 17 18 18   Temp:    (!) 97 F (36.1 C)  TempSrc:    Temporal  SpO2: 98% 99% 99% 96%  Weight:      Height:        Start Time: 1059 hrs. End Time: 1120 hrs.  Imaging Guidance (Spinal):          Type of Imaging Technique: Fluoroscopy Guidance (Spinal) Indication(s): Fluoroscopy guidance for needle placement to enhance accuracy in procedures requiring precise needle localization for targeted delivery of medication in or near specific anatomical locations not easily accessible without such real-time imaging assistance. Exposure Time: Please see nurses notes. Contrast: None used. Fluoroscopic Guidance: I was personally present during the use of fluoroscopy. "Tunnel Vision Technique" used to obtain the best possible view of the target area. Parallax error corrected before commencing the procedure. "Direction-depth-direction" technique used to introduce the needle under continuous pulsed fluoroscopy. Once target was reached, antero-posterior, oblique, and lateral fluoroscopic projection used confirm needle placement in all planes. Images permanently stored in EMR. Interpretation:  No contrast injected. I personally interpreted the imaging intraoperatively. Adequate needle placement confirmed in multiple planes. Permanent images saved into the patient's record.  Antibiotic Prophylaxis:   Anti-infectives (From admission, onward)    None      Indication(s): None  identified  Post-operative Assessment:  Post-procedure Vital Signs:  Pulse/HCG Rate: 7669 Temp: (!) 97 F (36.1 C) Resp: 18 BP: (!) 180/91 SpO2: 96 %  EBL: None  Complications: No immediate post-treatment complications observed by team, or reported by patient.  Note: The patient tolerated the entire procedure well. A repeat set of vitals were taken after the procedure and the patient was kept under observation following institutional policy, for this type of procedure. Post-procedural neurological assessment was performed, showing return to baseline, prior to discharge. The patient was provided with post-procedure discharge instructions, including a section on how to identify potential problems. Should any problems arise concerning this procedure, the patient was given instructions to immediately contact us, at any time, without hesitation. In any case, we plan to contact the patient by telephone for a follow-up status report regarding this interventional procedure.  Comments:  No additional relevant information.  Plan of Care (POC)  Orders:  Orders Placed This Encounter  Procedures   DG PAIN CLINIC C-ARM 1-60 MIN NO REPORT    Intraoperative interpretation by procedural physician at [facility].    Standing Status:   Standing    Number of Occurrences:   1    Reason for exam::   Assistance in needle guidance and placement for procedures requiring needle placement in or near specific anatomical locations not easily accessible without such assistance.    Medications ordered for procedure: Meds ordered this encounter  Medications   lidocaine (XYLOCAINE) 2 % (with pres) injection 400 mg   lactated ringers infusion   midazolam (VERSED) injection 0.5-2 mg    Make sure Flumazenil is available in the pyxis when using this medication. If oversedation occurs, administer 0.2 mg IV over 15 sec. If after 45 sec no response, administer 0.2 mg again over 1 min; may repeat at 1 min  intervals; not to exceed 4 doses (1 mg)   ropivacaine (PF) 2 mg/mL (0.2%) (NAROPIN) injection 18 mL   dexamethasone (DECADRON) injection 20 mg   Medications administered: We administered lidocaine, lactated ringers, midazolam, ropivacaine (PF) 2 mg/mL (0.2%), and dexamethasone.  See the medical record for exact dosing, route, and time of administration.  Follow-up plan:   Return in about 6 weeks (around 07/26/2023) for F2F PPE.       L4-L5 ESI 12/13/2021, 08/15/22; B/L L3,4,5 MBNB 10/05/22, 04/17/23: both provided 80% pain relief for 5-7 days. B/L L3,4,5 RFA 06/14/23      Recent Visits Date Type Provider Dept  05/15/23 Office Visit Edward Jolly, MD [facility]  04/17/23 Procedure visit Edward Jolly, MD [facility]  Showing recent visits within past 90 days and meeting all other requirements Today's Visits Date Type Provider Dept  06/14/23 Procedure visit Edward Jolly, MD [facility]  Showing today's visits and meeting all other requirements Future Appointments Date Type Provider Dept  07/26/23 Appointment Edward Jolly, MD [facility]  Showing future appointments within next 90 days and meeting all other requirements  Disposition: Discharge home  Discharge (Date  Time): 06/14/2023; 1138 hrs.   Primary Care Physician: Jerl Mina, MD Location: [facility] Note by: Edward Jolly, MD (TTS technology used. I apologize for any typographical errors that were not detected  and corrected.) Date: 06/14/2023; Time: 12:10 PM  Disclaimer:  Medicine is not an Visual merchandiser. The only guarantee in medicine is that nothing is guaranteed. It is important to note that the decision to proceed with this intervention was based on the information collected from the patient. The Data and conclusions were drawn from the patient's questionnaire, the interview, and the physical examination. Because the information was provided in large  part by the patient, it cannot be guaranteed that it has not been purposely or unconsciously manipulated. Every effort has been made to obtain as much relevant data as possible for this evaluation. It is important to note that the conclusions that lead to this procedure are derived in large part from the available data. Always take into account that the treatment will also be dependent on availability of resources and existing treatment guidelines, considered by other Pain Management Practitioners as being common knowledge and practice, at the time of the intervention. For Medico-Legal purposes, it is also important to point out that variation in procedural techniques and pharmacological choices are the acceptable norm. The indications, contraindications, technique, and results of the above procedure should only be interpreted and judged by a Board-Certified Interventional Pain Specialist with extensive familiarity and expertise in the same exact procedure and technique.

## 2023-06-14 NOTE — Patient Instructions (Signed)

## 2023-06-14 NOTE — Progress Notes (Signed)
 Safety precautions to be maintained throughout the outpatient stay will include: orient to surroundings, keep bed in low position, maintain call bell within reach at all times, provide assistance with transfer out of bed and ambulation.

## 2023-06-15 ENCOUNTER — Telehealth: Payer: Self-pay | Admitting: *Deleted

## 2023-06-15 NOTE — Telephone Encounter (Signed)
Post procedure call:   no  questions or concerns.  

## 2023-07-25 ENCOUNTER — Ambulatory Visit
Attending: Student in an Organized Health Care Education/Training Program | Admitting: Student in an Organized Health Care Education/Training Program

## 2023-07-25 VITALS — BP 148/78 | HR 92 | Temp 98.1°F | Resp 14

## 2023-07-25 DIAGNOSIS — G894 Chronic pain syndrome: Secondary | ICD-10-CM | POA: Insufficient documentation

## 2023-07-25 DIAGNOSIS — M5416 Radiculopathy, lumbar region: Secondary | ICD-10-CM | POA: Diagnosis present

## 2023-07-25 DIAGNOSIS — M4726 Other spondylosis with radiculopathy, lumbar region: Secondary | ICD-10-CM

## 2023-07-25 DIAGNOSIS — M48062 Spinal stenosis, lumbar region with neurogenic claudication: Secondary | ICD-10-CM | POA: Diagnosis present

## 2023-07-25 DIAGNOSIS — M47816 Spondylosis without myelopathy or radiculopathy, lumbar region: Secondary | ICD-10-CM | POA: Diagnosis present

## 2023-07-25 DIAGNOSIS — G8929 Other chronic pain: Secondary | ICD-10-CM | POA: Insufficient documentation

## 2023-07-25 NOTE — Progress Notes (Signed)
 PROVIDER NOTE: Interpretation of information contained herein should be left to medically-trained personnel. Specific patient instructions are provided elsewhere under "Patient Instructions" section of medical record. This document was created in part using AI and STT-dictation technology, any transcriptional errors that may result from this process are unintentional.  Patient: Eileen Maldonado  Service: E/M   PCP: Lyle San, MD  DOB: 21-May-1945  DOS: 07/25/2023  Provider: Cephus Collin, MD  MRN: [id number]  Delivery: Face-to-face  Specialty: Interventional Pain Management  Type: Established Patient  Setting: Ambulatory outpatient facility  Specialty designation: 09  Referring Prov.: Lyle San, MD  Location: Outpatient office facility       HPI  Ms. Eileen Maldonado, a 78 y.o. 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 Ms. Pickup's primary complain today is Back Pain (lower)  Pertinent problems: Ms. Riofrio has Rheumatoid arthritis (HCC); S/P total knee arthroplasty; Chronic bilateral low back pain with bilateral sciatica; Spinal stenosis, lumbar region, with neurogenic claudication; Chronic radicular lumbar pain; and Lumbar spondylosis on their pertinent problem list. Pain Assessment: Severity of   is reported as a 6 /10. Location: Back Lower/left leg to the ankle. Onset: More than a month ago. Quality: Aching, Constant. Timing: Constant. Modifying factor(s): Diclofenac, exercise. Vitals:  temporal temperature is 98.1 F (36.7 C). Her blood pressure is 148/78 (abnormal) and her pulse is 92. Her respiration is 14 and oxygen saturation is 96%.  BMI: Estimated body mass index is 27.1 kg/m as calculated from the following:   Height as of 06/14/23: 5\' 3"  (1.6 m).   Weight as of 06/14/23: 153 lb (69.4 kg). Last encounter: 05/15/2023. Last procedure: 06/14/2023.  Reason for encounter:   History of Present Illness    Eileen Maldonado is a 78 year old female with lumbar spinal stenosis who presents with persistent low back and leg pain after radiofrequency ablation.  She underwent radiofrequency ablation a little over a month ago, which did not provide significant pain relief. Her pain level remains unchanged from before the procedure. The pain is primarily located in the left buttocks, described as uncomfortable and occasionally itchy, with associated tightness in the left foot.  She has a history of lumbar spinal stenosis, particularly at the L4-L5 level, contributing to her symptoms. She experiences back and leg pain when standing or walking, and steroid injections have ceased to be effective. She feels pain, numbness, tingling, or heaviness when standing or walking, with no relief from sitting, bending forward, or sleeping in the fetal position.  Her ability to walk is affected, and she feels 'off balance' at times. She also reports having a headache during the conversation.  Her past medical history includes low potassium levels and mild anemia, though she is unsure if these conditions affect her current symptoms.        ROS  Constitutional: Denies any fever or chills Gastrointestinal: No reported hemesis, hematochezia, vomiting, or acute GI distress Musculoskeletal:  as above Neurological: No reported episodes of acute onset apraxia, aphasia, dysarthria, agnosia, amnesia, paralysis, loss of coordination, or loss of consciousness  Medication Review  Abatacept, Butenafine HCl, Dextran 70-Hypromellose, Fluticasone -Umeclidin-Vilant, Vitamin D3, albuterol, citalopram, conjugated estrogens , diclofenac, fluticasone , fluticasone  furoate-vilanterol, hydrochlorothiazide, leflunomide , loperamide, metoprolol  succinate, and pregabalin   History Review  Allergy: Ms. Bonaparte is allergic to amlodipine , lisinopril, celebrex [celecoxib], methotrexate derivatives, bupropion, doxycycline, etanercept, and latex. Drug:  Ms. Basic  reports no history of drug use. Alcohol:  reports no history of alcohol  use. Tobacco:  reports that she has never smoked. She has never used smokeless tobacco. Social: Ms. Hutzel  reports that she has never smoked. She has never used smokeless tobacco. She reports that she does not drink alcohol and does not use drugs. Medical:  has a past medical history of Breast cancer (HCC), Collagen vascular disease (HCC), Hives, Hypertension, ILD (interstitial lung disease) (HCC), Pneumonia, PVD (peripheral vascular disease) (HCC), RA (rheumatoid arthritis) (HCC), Raynaud's phenomenon, and Right thyroid  nodule. Surgical: Ms. Kozloff  has a past surgical history that includes appy; breast biopsy; hernia repair; Hernia repair; Knee Arthroplasty (Left, 07/06/2016); Breast surgery (Left); Appendectomy; Hysteroscopy with D & C (N/A, 12/22/2017); and Colonoscopy (N/A, 04/13/2022). Family: family history includes Breast cancer in her mother; Hypertension in her father and mother; Stroke in her father.  Laboratory Chemistry Profile   Renal Lab Results  Component Value Date   BUN 19 12/19/2017   CREATININE 1.06 (H) 12/19/2017   GFRAA 59 (L) 12/19/2017   GFRNONAA 51 (L) 12/19/2017    Hepatic Lab Results  Component Value Date   AST 25 06/22/2016   ALT 21 06/22/2016   ALBUMIN 4.1 06/22/2016   ALKPHOS 76 06/22/2016   LIPASE 22 01/31/2016    Electrolytes Lab Results  Component Value Date   NA 138 12/22/2017   K 3.3 (L) 12/22/2017   CL 104 12/19/2017   CALCIUM 9.1 12/19/2017    Bone No results found for: "VD25OH", "VD125OH2TOT", "WJ1914NW2", "NF6213YQ6", "25OHVITD1", "25OHVITD2", "25OHVITD3", "TESTOFREE", "TESTOSTERONE"  Inflammation (CRP: Acute Phase) (ESR: Chronic Phase) Lab Results  Component Value Date   CRP <0.8 06/22/2016   ESRSEDRATE 11 06/22/2016   LATICACIDVEN 2.3 (HH) 01/31/2016         Note: Above Lab results reviewed.    Physical Exam  General appearance: Well nourished,  well developed, and well hydrated. In no apparent acute distress Mental status: Alert, oriented x 3 (person, place, & time)       Respiratory: No evidence of acute respiratory distress Eyes: PERLA Vitals: BP (!) 148/78   Pulse 92   Temp 98.1 F (36.7 C) (Temporal)   Resp 14   SpO2 96%  BMI: Estimated body mass index is 27.1 kg/m as calculated from the following:   Height as of 06/14/23: 5\' 3"  (1.6 m).   Weight as of 06/14/23: 153 lb (69.4 kg). Ideal: Patient weight not recorded  Assessment   Diagnosis  1. Spinal stenosis, lumbar region, with neurogenic claudication   2. Chronic radicular lumbar pain   3. Lumbar spondylosis   4. Lumbar facet arthropathy   5. Chronic pain syndrome      Updated Problems: No problems updated.  Plan of Care  Problem-specific:  Assessment and Plan    Spinal stenosis at L4-L5   Spinal stenosis at L4-L5 is causing back and leg pain, tightness, and difficulty standing or walking for extended periods. Symptoms include pain, numbness, tingling, and heaviness when standing or walking. She is hesitant about surgical interventions but open to less invasive procedures. The MILD procedure involves a small incision and removal of hypertrophied ligament to create space in the spinal canal, potentially improving walking and standing duration. This is a chronic condition, and the procedure may not completely alleviate symptoms. Recommend the MILD procedure to remove hypertrophied ligament and create space in the spinal canal. Discuss the risks and benefits of the MILD procedure, including potential improvement in walking and standing duration. Offer to schedule the MILD procedure if she decides to proceed. Keep  follow-up open-ended; she will contact the clinic if she decides to proceed with the MILD procedure.  Chronic pain post-ablation   Persistent chronic pain continues following radiofrequency ablation with no significant reduction in pain levels. Pain is located  in the left buttocks and is associated with tightness in the left foot. The ablation was performed over a month ago, and the pain remains at the same level as before the procedure. She reports no significant relief from the procedure. Discuss the potential benefits and limitations of the MILD procedure for pain management. Provide a brochure on the MILD procedure for further reading. Consider a spinal cord stimulator if the MILD procedure is not preferred.       Ms. Redina R Bise has a current medication list which includes the following long-term medication(s): abatacept, albuterol, fluticasone , hydrochlorothiazide, leflunomide , metoprolol  succinate, citalopram, and pregabalin .  Pharmacotherapy (Medications Ordered): No orders of the defined types were placed in this encounter.  Orders:  No orders of the defined types were placed in this encounter.  Follow-up plan:   No follow-ups on file.     L4-L5 ESI 12/13/2021, 08/15/22; B/L L3,4,5 MBNB 10/05/22, 04/17/23: both provided 80% pain relief for 5-7 days. B/L L3,4,5 RFA 06/14/23     Recent Visits Date Type Provider Dept  06/14/23 Procedure visit Cephus Collin, MD [facility]  05/15/23 Office Visit Cephus Collin, MD [facility]  Showing recent visits within past 90 days and meeting all other requirements Today's Visits Date Type Provider Dept  07/25/23 Office Visit Cephus Collin, MD [facility]  Showing today's visits and meeting all other requirements Future Appointments No visits were found meeting these conditions. Showing future appointments within next 90 days and meeting all other requirements  I discussed the assessment and treatment plan with the patient. The patient was provided an opportunity to ask questions and all were answered. The patient agreed with the plan and demonstrated an understanding of the instructions.  Patient advised to call back or seek an in-person evaluation if the symptoms  or condition worsens.  Duration of encounter: .  Total time on encounter, as per AMA guidelines included both the face-to-face and non-face-to-face time personally spent by the physician and/or other qualified health care professional(s) on the day of the encounter (includes time in activities that require the physician or other qualified health care professional and does not include time in activities normally performed by clinical staff). Physician's time may include the following activities when performed: Preparing to see the patient (e.g., pre-charting review of records, searching for previously ordered imaging, lab work, and nerve conduction tests) Review of prior analgesic pharmacotherapies. Reviewing PMP Interpreting ordered tests (e.g., lab work, imaging, nerve conduction tests) Performing post-procedure evaluations, including interpretation of diagnostic procedures Obtaining and/or reviewing separately obtained history Performing a medically appropriate examination and/or evaluation Counseling and educating the patient/family/caregiver Ordering medications, tests, or procedures Referring and communicating with other health care professionals (when not separately reported) Documenting clinical information in the electronic or other health record Independently interpreting results (not separately reported) and communicating results to the patient/ family/caregiver Care coordination (not separately reported)  Note by: Cephus Collin, MD (TTS and AI technology used. I apologize for any typographical errors that were not detected and corrected.) Date: 07/25/2023; Time: 11:18 AM

## 2023-07-26 ENCOUNTER — Ambulatory Visit: Admitting: Student in an Organized Health Care Education/Training Program

## 2023-10-23 ENCOUNTER — Telehealth: Payer: Self-pay | Admitting: Student in an Organized Health Care Education/Training Program

## 2023-10-23 NOTE — Telephone Encounter (Signed)
 PT called back to let Lateef know that she does wants to move forward with the mild procedure. PT states she also wants to know what medication will she be giving during the procedure. Please give patient a call. TY

## 2023-10-25 NOTE — Telephone Encounter (Signed)
 Patient left VM wanting to know when she could make appt. For MILD.  Do you need to see her again, or do you want to put in an order?

## 2023-11-02 ENCOUNTER — Encounter: Payer: Self-pay | Admitting: Student in an Organized Health Care Education/Training Program

## 2023-11-02 ENCOUNTER — Ambulatory Visit
Admission: RE | Admit: 2023-11-02 | Discharge: 2023-11-02 | Disposition: A | Discharge status: Home / Self Care | Source: Ambulatory Visit | Attending: Student in an Organized Health Care Education/Training Program | Admitting: Student in an Organized Health Care Education/Training Program

## 2023-11-02 ENCOUNTER — Ambulatory Visit: Admitting: Student in an Organized Health Care Education/Training Program

## 2023-11-02 VITALS — BP 179/83 | HR 86 | Temp 97.6°F | Resp 18 | Ht 62.0 in | Wt 147.0 lb

## 2023-11-02 DIAGNOSIS — M4726 Other spondylosis with radiculopathy, lumbar region: Secondary | ICD-10-CM

## 2023-11-02 DIAGNOSIS — G894 Chronic pain syndrome: Secondary | ICD-10-CM | POA: Diagnosis not present

## 2023-11-02 DIAGNOSIS — M5416 Radiculopathy, lumbar region: Secondary | ICD-10-CM | POA: Diagnosis present

## 2023-11-02 DIAGNOSIS — M48062 Spinal stenosis, lumbar region with neurogenic claudication: Secondary | ICD-10-CM

## 2023-11-02 DIAGNOSIS — M47816 Spondylosis without myelopathy or radiculopathy, lumbar region: Secondary | ICD-10-CM | POA: Insufficient documentation

## 2023-11-02 DIAGNOSIS — G8929 Other chronic pain: Secondary | ICD-10-CM | POA: Insufficient documentation

## 2023-11-02 NOTE — Progress Notes (Signed)
 Safety precautions to be maintained throughout the outpatient stay will include: orient to surroundings, keep bed in low position, maintain call bell within reach at all times, provide assistance with transfer out of bed and ambulation.

## 2023-11-02 NOTE — Progress Notes (Signed)
 PROVIDER NOTE: Interpretation of information contained herein should be left to medically-trained personnel. Specific patient instructions are provided elsewhere under Patient Instructions section of medical record. This document was created in part using AI and STT-dictation technology, any transcriptional errors that may result from this process are unintentional.  Patient: Eileen Maldonado  Service: E/M   PCP: Valora Agent, MD  DOB: 29-Oct-1945  DOS: 11/02/2023  Provider: Wallie Sherry, MD  MRN: [id number]  Delivery: Face-to-face  Specialty: Interventional Pain Management  Type: Established Patient  Setting: Ambulatory outpatient facility  Specialty designation: 09  Referring Prov.: Valora Agent, MD  Location: Outpatient office facility       History of present illness (HPI) Ms. Eileen Maldonado, a 78 y.o. 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 Ms. Saline's primary complain today is Back Pain (low)  Pertinent problems: [name] has Rheumatoid arthritis (HCC); S/P total knee arthroplasty; Chronic bilateral low back pain with bilateral sciatica; Spinal stenosis, lumbar region, with neurogenic claudication; Chronic radicular lumbar pain; and Lumbar spondylosis on their pertinent problem list.  Pain Assessment: Severity of   is reported as a 6 /10. Location: Back Left/radiates down left buttock into left leg. Onset: More than a month ago. Quality: Aching, Sharp. Timing: Constant. Modifying factor(s):  SABRA Vitals:  height is 5' 2 (1.575 m) and weight is 147 lb (66.7 kg). Her temperature is 97.6 F (36.4 C). Her blood pressure is 179/83 (abnormal) and her pulse is 86. Her respiration is 18 and oxygen saturation is 100%.  BMI: Estimated body mass index is 26.89 kg/m as calculated from the following:   Height as of this encounter: 5' 2 (1.575 m).   Weight as of this encounter: 147 lb (66.7 kg).  Last encounter:  07/25/2023. Last procedure: 06/14/2023.  Reason for encounter: patient-requested evaluation.  Patient struggles with low back pain and difficulty walking or standing for long period of time secondary to lumbar spinal stenosis with neurogenic claudication.  She finds benefit with sitting, bending forward and resting.  She does experience occasional numbness in the back of her buttocks.  She has tried physical therapy and lumbar epidural steroid injections in the past with diminishing benefit.  She has ligamentum flavum hypertrophy at L4-L5 resulting in severe canal stenosis.  This is documented on her lumbar MRI done on 09/21/2022.   ROS  Constitutional: Denies any fever or chills Gastrointestinal: No reported hemesis, hematochezia, vomiting, or acute GI distress Musculoskeletal: Low back and proximal buttock and thigh pain Neurological: No reported episodes of acute onset apraxia, aphasia, dysarthria, agnosia, amnesia, paralysis, loss of coordination, or loss of consciousness  Medication Review  Abatacept, Butenafine HCl, Dextran 70-Hypromellose, Fluticasone -Umeclidin-Vilant, Vitamin D3, albuterol, citalopram, conjugated estrogens , diclofenac, fluticasone , fluticasone  furoate-vilanterol, hydrochlorothiazide, leflunomide , loperamide, metoprolol  succinate, and pregabalin   History Review  Allergy: Eileen Maldonado is allergic to amlodipine , lisinopril, celebrex [celecoxib], methotrexate derivatives, bupropion, doxycycline, etanercept, and latex. Drug: Eileen Maldonado  reports no history of drug use. Alcohol:  reports no history of alcohol use. Tobacco:  reports that she has never smoked. She has never used smokeless tobacco. Social: Ms. Mishkin  reports that she has never smoked. She has never used smokeless tobacco. She reports that she does not drink alcohol and does not use drugs. Medical:  has a past medical history of Breast cancer (HCC), Collagen vascular disease (HCC), Hives, Hypertension, ILD  (interstitial lung disease) (HCC), Pneumonia, PVD (peripheral vascular disease) (HCC), RA (rheumatoid arthritis) (HCC),  Raynaud's phenomenon, and Right thyroid  nodule. Surgical: Eileen Maldonado  has a past surgical history that includes appy; breast biopsy; hernia repair; Hernia repair; Knee Arthroplasty (Left, 07/06/2016); Breast surgery (Left); Appendectomy; Hysteroscopy with D & C (N/A, 12/22/2017); and Colonoscopy (N/A, 04/13/2022). Family: family history includes Breast cancer in her mother; Hypertension in her father and mother; Stroke in her father.  Laboratory Chemistry Profile   Renal Lab Results  Component Value Date   BUN 19 12/19/2017   CREATININE 1.06 (H) 12/19/2017   GFRAA 59 (L) 12/19/2017   GFRNONAA 51 (L) 12/19/2017    Hepatic Lab Results  Component Value Date   AST 25 06/22/2016   ALT 21 06/22/2016   ALBUMIN 4.1 06/22/2016   ALKPHOS 76 06/22/2016   LIPASE 22 01/31/2016    Electrolytes Lab Results  Component Value Date   NA 138 12/22/2017   K 3.3 (L) 12/22/2017   CL 104 12/19/2017   CALCIUM 9.1 12/19/2017    Bone No results found for: VD25OH, VD125OH2TOT, CI6874NY7, CI7874NY7, 25OHVITD1, 25OHVITD2, 25OHVITD3, TESTOFREE, TESTOSTERONE  Inflammation (CRP: Acute Phase) (ESR: Chronic Phase) Lab Results  Component Value Date   CRP <0.8 06/22/2016   ESRSEDRATE 11 06/22/2016   LATICACIDVEN 2.3 (HH) 01/31/2016         Note: Above Lab results reviewed.  Recent Imaging Review   Narrative & Impression  CLINICAL DATA:  Low back pain, symptoms persist with > 6 wks treatment Lumbar radiculopathy, symptoms persist with > 6 wks treatment   EXAM: MRI LUMBAR SPINE WITHOUT CONTRAST   TECHNIQUE: Multiplanar, multisequence MR imaging of the lumbar spine was performed. No intravenous contrast was administered.   COMPARISON:  05/04/2016   FINDINGS: Segmentation:  Standard.   Alignment:  Grade 1 anterolisthesis of L4 on L5 and L5 on S1.   Vertebrae:   No fracture, evidence of discitis, or bone lesion.   Conus medullaris and cauda equina: Conus extends to the L1 level. Conus and cauda equina appear normal.   Paraspinal and other soft tissues: Colonic diverticulosis. Numerous uterine fibroids.   Disc levels:   T12-L1: Annular disc bulge, eccentric to the right. No canal stenosis. Mild right foraminal stenosis. Unchanged.   L1-L2: No significant disc protrusion, foraminal stenosis, or canal stenosis.   L2-L3: No significant disc protrusion, foraminal stenosis, or canal stenosis.   L3-L4: Broad-based disc bulge with mild bilateral facet arthropathy and ligamentum flavum buckling. Moderate canal stenosis with bilateral subarticular recess stenosis. Mild bilateral foraminal stenosis. Findings have progressed from prior.   L4-L5: Anterolisthesis with disc uncovering and annular disc bulge. Moderate bilateral facet arthropathy with ligamentum flavum buckling. Severe canal stenosis with moderate right and mild left foraminal stenosis. Findings have progressed from prior.   L5-S1: Anterolisthesis with disc uncovering and broad-based disc bulge. Moderate bilateral facet arthropathy. Mild-to-moderate canal stenosis with bilateral subarticular recess stenosis. Moderate left foraminal stenosis. Findings have progressed from prior.   IMPRESSION: 1. Multilevel lumbar spondylosis, progressed since 2018. 2. Severe canal stenosis at L4-L5 with moderate right and mild left foraminal stenosis. 3. Moderate canal stenosis at L3-L4 with bilateral subarticular recess stenosis and mild bilateral foraminal stenosis. 4. Mild-to-moderate canal stenosis at L5-S1 with bilateral subarticular recess stenosis and moderate left foraminal stenosis.     Electronically Signed   By: Mabel Converse D.O.   On: 09/28/2022 15:28     Note: Reviewed        Physical Exam  Vitals: BP (!) 179/83   Pulse 86   Temp 97.6 F (  36.4 C)   Resp 18   Ht 5' 2  (1.575 m)   Wt 147 lb (66.7 kg)   SpO2 100%   BMI 26.89 kg/m  BMI: Estimated body mass index is 26.89 kg/m as calculated from the following:   Height as of this encounter: 5' 2 (1.575 m).   Weight as of this encounter: 147 lb (66.7 kg). Ideal: Ideal body weight: 50.1 kg (110 lb 7.2 oz) Adjusted ideal body weight: 56.7 kg (125 lb 1.1 oz) General appearance: Well nourished, well developed, and well hydrated. In no apparent acute distress Mental status: Alert, oriented x 3 (person, place, & time)       Respiratory: No evidence of acute respiratory distress Eyes: PERLA  Thoracic Spine Area Exam  Skin & Axial Inspection: No masses, redness, or swelling Alignment: Symmetrical Functional ROM: Unrestricted ROM Stability: No instability detected Muscle Tone/Strength: Functionally intact. No obvious neuro-muscular anomalies detected. Sensory (Neurological): Unimpaired Muscle strength & Tone: No palpable anomalies Lumbar Spine Area Exam  Skin & Axial Inspection: No masses, redness, or swelling Alignment: Symmetrical Functional ROM: Pain restricted ROM       Stability: No instability detected Muscle Tone/Strength: Functionally intact. No obvious neuro-muscular anomalies detected. Sensory (Neurological): Neurogenic pain pattern  Gait & Posture Assessment  Ambulation: Unassisted Gait: Relatively normal for age and body habitus Posture: Difficulty standing up straight, due to pain  Lower Extremity Exam    Side: Right lower extremity  Side: Left lower extremity  Stability: No instability observed          Stability: No instability observed          Skin & Extremity Inspection: Skin color, temperature, and hair growth are WNL. No peripheral edema or cyanosis. No masses, redness, swelling, asymmetry, or associated skin lesions. No contractures.  Skin & Extremity Inspection: Skin color, temperature, and hair growth are WNL. No peripheral edema or cyanosis. No masses, redness, swelling,  asymmetry, or associated skin lesions. No contractures.  Functional ROM: Pain restricted ROM for hip and knee joints          Functional ROM: Pain restricted ROM for hip and knee joints          Muscle Tone/Strength: Functionally intact. No obvious neuro-muscular anomalies detected.  Muscle Tone/Strength: Functionally intact. No obvious neuro-muscular anomalies detected.  Sensory (Neurological): Unimpaired        Sensory (Neurological): Unimpaired        DTR: Patellar: deferred today Achilles: deferred today Plantar: deferred today  DTR: Patellar: deferred today Achilles: deferred today Plantar: deferred today  Palpation: No palpable anomalies  Palpation: No palpable anomalies    Assessment   Diagnosis  1. Spinal stenosis, lumbar region, with neurogenic claudication   2. Chronic radicular lumbar pain   3. Lumbar spondylosis   4. Chronic pain syndrome      Updated Problems: No problems updated.  Plan of Care  Assessment: Lumbar spinal stenosis with neurogenic claudication, primarily at the L4-L5 level, due to significant ligamentum flavum hypertrophy resulting in severe central canal stenosis on imaging. Patient reports activity-limiting lower extremity pain and heaviness consistent with claudication symptoms.  The patient has previously undergone physical therapy and lumbar epidural steroid injection, both of which have provided only temporary or diminishing relief. Given ongoing functional impairment and imaging findings, the patient may be a candidate for minimally invasive decompression.  Plan:  Recommend proceeding with the MILD procedure (Minimally Invasive Lumbar Decompression) at L4-L5, targeting hypertrophic ligamentum flavum to alleviate canal narrowing and  improve neurogenic claudication symptoms. Discussed indications, risks, benefits, and alternatives, including open decompression surgery. Patient prefers a minimally invasive approach at this time. Submit for  insurance authorization. Coordinate with device representative to arrange equipment and support. Follow up post-procedure to assess clinical response and adjust treatment plan as needed.  Orders:  Orders Placed This Encounter  Procedures   Minimally Invasive Lumbar Decompression (MILD)    Standing Status:   Future    Expiration Date:   11/01/2024    Scheduling Instructions:     Dr Marcelino Reesa Honer and Gretta with [employer]    Where will this procedure be performed?:   [facility]   [facility] C-ARM 1-60 MIN NO REPORT    Intraoperative interpretation by procedural physician at [facility].    Standing Status:   Standing    Number of Occurrences:   1    Reason for exam::   Assistance in needle guidance and placement for procedures requiring needle placement in or near specific anatomical locations not easily accessible without such assistance.     L4-L5 ESI 12/13/2021, 08/15/22; B/L L3,4,5 MBNB 10/05/22, 04/17/23: both provided 80% pain relief for 5-7 days. B/L L3,4,5 RFA 06/14/23      Return in about 20 days (around 11/22/2023) for MILD , ECT.    Recent Visits No visits were found meeting these conditions. Showing recent visits within past 90 days and meeting all other requirements Today's Visits Date Type Provider Dept  11/02/23 Office Visit Marcelino Nurse, MD [facility]  Showing today's visits and meeting all other requirements Future Appointments Date Type Provider Dept  11/22/23 Appointment Marcelino Nurse, MD [facility]  Showing future appointments within next 90 days and meeting all other requirements  I discussed the assessment and treatment plan with the patient. The patient was provided an opportunity to ask questions and all were answered. The patient agreed with the plan and demonstrated an understanding of the instructions.  Patient advised to call back or seek an in-person evaluation if the symptoms or condition  worsens.  Duration of encounter: .  Total time on encounter, as per AMA guidelines included both the face-to-face and non-face-to-face time personally spent by the physician and/or other qualified health care professional(s) on the day of the encounter (includes time in activities that require the physician or other qualified health care professional and does not include time in activities normally performed by clinical staff). Physician's time may include the following activities when performed: Preparing to see the patient (e.g., pre-charting review of records, searching for previously ordered imaging, lab work, and nerve conduction tests) Review of prior analgesic pharmacotherapies. Reviewing PMP Interpreting ordered tests (e.g., lab work, imaging, nerve conduction tests) Performing post-procedure evaluations, including interpretation of diagnostic procedures Obtaining and/or reviewing separately obtained history Performing a medically appropriate examination and/or evaluation Counseling and educating the patient/family/caregiver Ordering medications, tests, or procedures Referring and communicating with other health care professionals (when not separately reported) Documenting clinical information in the electronic or other health record Independently interpreting results (not separately reported) and communicating results to the patient/ family/caregiver Care coordination (not separately reported)  Note by: Nurse Marcelino, MD (TTS and AI technology used. I apologize for any typographical errors that were not detected and corrected.) Date: 11/02/2023; Time: 3:50 PM

## 2023-11-02 NOTE — Patient Instructions (Addendum)
 GENERAL RISKS AND COMPLICATIONS  What are the risk, side effects and possible complications? Generally speaking, most procedures are safe.  However, with any procedure there are risks, side effects, and the possibility of complications.  The risks and complications are dependent upon the sites that are lesioned, or the type of nerve block to be performed.  The closer the procedure is to the spine, the more serious the risks are.  Great care is taken when placing the radio frequency needles, block needles or lesioning probes, but sometimes complications can occur. Infection: Any time there is an injection through the skin, there is a risk of infection.  This is why sterile conditions are used for these blocks.  There are four possible types of infection. Localized skin infection. Central Nervous System Infection-This can be in the form of Meningitis, which can be deadly. Epidural Infections-This can be in the form of an epidural abscess, which can cause pressure inside of the spine, causing compression of the spinal cord with subsequent paralysis. This would require an emergency surgery to decompress, and there are no guarantees that the patient would recover from the paralysis. Discitis-This is an infection of the intervertebral discs.  It occurs in about 1% of discography procedures.  It is difficult to treat and it may lead to surgery.        2. Pain: the needles have to go through skin and soft tissues, will cause soreness.       3. Damage to internal structures:  The nerves to be lesioned may be near blood vessels or    other nerves which can be potentially damaged.       4. Bleeding: Bleeding is more common if the patient is taking blood thinners such as  aspirin, Coumadin, Ticiid, Plavix, etc., or if he/she have some genetic predisposition  such as hemophilia. Bleeding into the spinal canal can cause compression of the spinal  cord with subsequent paralysis.  This would require an emergency  surgery to  decompress and there are no guarantees that the patient would recover from the  paralysis.       5. Pneumothorax:  Puncturing of a lung is a possibility, every time a needle is introduced in  the area of the chest or upper back.  Pneumothorax refers to free air around the  collapsed lung(s), inside of the thoracic cavity (chest cavity).  Another two possible  complications related to a similar event would include: Hemothorax and Chylothorax.   These are variations of the Pneumothorax, where instead of air around the collapsed  lung(s), you may have blood or chyle, respectively.       6. Spinal headaches: They may occur with any procedures in the area of the spine.       7. Persistent CSF (Cerebro-Spinal Fluid) leakage: This is a rare problem, but may occur  with prolonged intrathecal or epidural catheters either due to the formation of a fistulous  track or a dural tear.       8. Nerve damage: By working so close to the spinal cord, there is always a possibility of  nerve damage, which could be as serious as a permanent spinal cord injury with  paralysis.       9. Death:  Although rare, severe deadly allergic reactions known as Anaphylactic  reaction can occur to any of the medications used.      10. Worsening of the symptoms:  We can always make thing worse.  What are the chances  of something like this happening? Chances of any of this occuring are extremely low.  By statistics, you have more of a chance of getting killed in a motor vehicle accident: while driving to the hospital than any of the above occurring .  Nevertheless, you should be aware that they are possibilities.  In general, it is similar to taking a shower.  Everybody knows that you can slip, hit your head and get killed.  Does that mean that you should not shower again?  Nevertheless always keep in mind that statistics do not mean anything if you happen to be on the wrong side of them.  Even if a procedure has a 1 (one) in a  1,000,000 (million) chance of going wrong, it you happen to be that one..Also, keep in mind that by statistics, you have more of a chance of having something go wrong when taking medications.  Who should not have this procedure? If you are on a blood thinning medication (e.g. Coumadin, Plavix, see list of Blood Thinners), or if you have an active infection going on, you should not have the procedure.  If you are taking any blood thinners, please inform your physician.  How should I prepare for this procedure? Do not eat or drink anything at least six hours prior to the procedure. Bring a driver with you .  It cannot be a taxi. Come accompanied by an adult that can drive you back, and that is strong enough to help you if your legs get weak or numb from the local anesthetic. Take all of your medicines the morning of the procedure with just enough water to swallow them. If you have diabetes, make sure that you are scheduled to have your procedure done first thing in the morning, whenever possible. If you have diabetes, take only half of your insulin dose and notify our nurse that you have done so as soon as you arrive at the clinic. If you are diabetic, but only take blood sugar pills (oral hypoglycemic), then do not take them on the morning of your procedure.  You may take them after you have had the procedure. Do not take aspirin or any aspirin-containing medications, at least eleven (11) days prior to the procedure.  They may prolong bleeding. Wear loose fitting clothing that may be easy to take off and that you would not mind if it got stained with Betadine or blood. Do not wear any jewelry or perfume Remove any nail coloring.  It will interfere with some of our monitoring equipment.  NOTE: Remember that this is not meant to be interpreted as a complete list of all possible complications.  Unforeseen problems may occur.  BLOOD THINNERS The following drugs contain aspirin or other products,  which can cause increased bleeding during surgery and should not be taken for 2 weeks prior to and 1 week after surgery.  If you should need take something for relief of minor pain, you may take acetaminophen  which is found in Tylenol ,m Datril, Anacin-3 and Panadol. It is not blood thinner. The products listed below are.  Do not take any of the products listed below in addition to any listed on your instruction sheet.  A.P.C or A.P.C with Codeine Codeine Phosphate Capsules #3 Ibuprofen Ridaura  ABC compound Congesprin Imuran rimadil  Advil Cope Indocin Robaxisal  Alka-Seltzer Effervescent Pain Reliever and Antacid Coricidin or Coricidin-D  Indomethacin Rufen  Alka-Seltzer plus Cold Medicine Cosprin Ketoprofen S-A-C Tablets  Anacin Analgesic Tablets or Capsules Coumadin  Korlgesic Salflex  Anacin Extra Strength Analgesic tablets or capsules CP-2 Tablets Lanoril Salicylate  Anaprox Cuprimine Capsules Levenox Salocol  Anexsia-D Dalteparin Magan Salsalate  Anodynos Darvon compound Magnesium  Salicylate Sine-off  Ansaid Dasin Capsules Magsal Sodium Salicylate  Anturane Depen Capsules Marnal Soma  APF Arthritis pain formula Dewitt's Pills Measurin Stanback  Argesic Dia-Gesic Meclofenamic Sulfinpyrazone  Arthritis Bayer Timed Release Aspirin Diclofenac Meclomen Sulindac  Arthritis pain formula Anacin Dicumarol Medipren Supac  Analgesic (Safety coated) Arthralgen Diffunasal Mefanamic Suprofen  Arthritis Strength Bufferin Dihydrocodeine Mepro Compound Suprol  Arthropan liquid Dopirydamole Methcarbomol with Aspirin Synalgos  ASA tablets/Enseals Disalcid Micrainin Tagament  Ascriptin Doan's Midol Talwin  Ascriptin A/D Dolene Mobidin Tanderil  Ascriptin Extra Strength Dolobid Moblgesic Ticlid  Ascriptin with Codeine Doloprin or Doloprin with Codeine Momentum Tolectin  Asperbuf Duoprin Mono-gesic Trendar  Aspergum Duradyne Motrin or Motrin IB Triminicin  Aspirin plain, buffered or enteric coated  Durasal Myochrisine Trigesic  Aspirin Suppositories Easprin Nalfon Trillsate  Aspirin with Codeine Ecotrin Regular or Extra Strength Naprosyn Uracel  Atromid-S Efficin Naproxen Ursinus  Auranofin Capsules Elmiron Neocylate Vanquish  Axotal Emagrin Norgesic Verin  Azathioprine Empirin or Empirin with Codeine Normiflo Vitamin E   Azolid Emprazil Nuprin Voltaren  Bayer Aspirin plain, buffered or children's or timed BC Tablets or powders Encaprin Orgaran Warfarin Sodium  Buff-a-Comp Enoxaparin  Orudis Zorpin  Buff-a-Comp with Codeine Equegesic Os-Cal-Gesic   Buffaprin Excedrin plain, buffered or Extra Strength Oxalid   Bufferin Arthritis Strength Feldene Oxphenbutazone   Bufferin plain or Extra Strength Feldene Capsules Oxycodone  with Aspirin   Bufferin with Codeine Fenoprofen Fenoprofen Pabalate or Pabalate-SF   Buffets II Flogesic Panagesic   Buffinol plain or Extra Strength Florinal or Florinal with Codeine Panwarfarin   Buf-Tabs Flurbiprofen Penicillamine   Butalbital Compound Four-way cold tablets Penicillin   Butazolidin Fragmin Pepto-Bismol   Carbenicillin Geminisyn Percodan   Carna Arthritis Reliever Geopen Persantine   Carprofen Gold's salt Persistin   Chloramphenicol Goody's Phenylbutazone   Chloromycetin Haltrain Piroxlcam   Clmetidine heparin Plaquenil   Cllnoril Hyco-pap Ponstel   Clofibrate Hydroxy chloroquine Propoxyphen         Before stopping any of these medications, be sure to consult the physician who ordered them.  Some, such as Coumadin (Warfarin) are ordered to prevent or treat serious conditions such as deep thrombosis, pumonary embolisms, and other heart problems.  The amount of time that you may need off of the medication may also vary with the medication and the reason for which you were taking it.  If you are taking any of these medications, please make sure you notify your pain physician before you undergo any procedures.         Moderate Conscious  Sedation, Adult Sedation is the use of medicines to help you relax and not feel pain. Moderate conscious sedation is a type of sedation that makes you less alert than normal. You are still able to respond to instructions, touch, or both. This type of sedation is used during short medical and dental procedures. It is milder than deep sedation, which is a type of sedation you cannot be easily woken up from. It is also milder than general anesthesia, which is the use of medicines to make you fall asleep. Moderate conscious sedation lets you return to your normal activities sooner. Tell a health care provider about: Any allergies you have. All medicines you are taking, including vitamins, herbs, steroids, eye drops, creams, and over-the-counter medicines. Any problems you or family members have had with anesthesia.  Any bleeding problems you have. Any surgeries you have had. Any medical conditions you have. Whether you are pregnant or may be pregnant. Any recent alcohol, tobacco, or drug use. What are the risks? Your health care provider will talk with you about risks. These may include: Oversedation. This is when you get too much medicine. Nausea or vomiting. Allergic reaction to medicines. Trouble breathing. If this happens, a breathing tube may be used. It will be removed when you can breathe better on your own. Heart trouble. Lung trouble. Emergence delirium. This is when you feel confused while the sedation wears off. This gets better with time. What happens before the procedure? When to stop eating and drinking Follow instructions from your health care provider about what you may eat and drink. These may include: 8 hours before your procedure Stop eating most foods. Do not eat meat, fried foods, or fatty foods. Eat only light foods, such as toast or crackers. All liquids are okay except energy drinks and alcohol. 6 hours before your procedure Stop eating. Drink only clear liquids, such  as water, clear fruit juice, black coffee, plain tea, and sports drinks. Do not drink energy drinks or alcohol. 2 hours before your procedure Stop drinking all liquids. You may be allowed to take medicines with small sips of water. If you do not follow your health care provider's instructions, your procedure may be delayed or canceled. Medicines Ask your health care provider about: Changing or stopping your regular medicines. These include any diabetes medicines or blood thinners you take. Taking medicines such as aspirin and ibuprofen. These medicines can thin your blood. Do not take them unless your health care provider tells you to. Taking over-the-counter medicines, vitamins, herbs, and supplements. Tests and exams You may have an exam or testing. You may have a blood or urine sample taken. General instructions Do not use any products that contain nicotine or tobacco for at least 4 weeks before the procedure. These products include cigarettes, chewing tobacco, and vaping devices, such as e-cigarettes. If you need help quitting, ask your health care provider. If you will be going home right after the procedure, plan to have a responsible adult: Take you home from the hospital or clinic. You will not be allowed to drive. Care for you for the time you are told. What happens during the procedure?  You will be given the sedative. It may be given: As a pill you can take by mouth. It can also be put into the rectum. As a spray through the nose. As an injection into muscle. As an injection into a vein through an IV. You may be given oxygen as needed. Your blood pressure, heart rate, breathing rate, and blood oxygen level will be monitored during the procedure. The medical or dental procedure will be done. The procedure may vary among health care providers and hospitals. What happens after the procedure? Your blood pressure, heart rate, breathing rate, and blood oxygen level will be  monitored until you leave the hospital or clinic. You will get fluids through an IV as needed. Do not drive or operate machinery until your health care provider says that it is safe. This information is not intended to replace advice given to you by your health care provider. Make sure you discuss any questions you have with your health care provider. Document Revised: 09/27/2021 Document Reviewed: 09/27/2021 Elsevier Patient Education  2024 [employer]Give pt soap scrub, apply the night before

## 2023-11-22 ENCOUNTER — Ambulatory Visit
Admission: RE | Admit: 2023-11-22 | Discharge: 2023-11-22 | Disposition: A | Discharge status: Home / Self Care | Source: Ambulatory Visit | Attending: Student in an Organized Health Care Education/Training Program | Admitting: Student in an Organized Health Care Education/Training Program

## 2023-11-22 ENCOUNTER — Encounter: Payer: Self-pay | Admitting: Student in an Organized Health Care Education/Training Program

## 2023-11-22 ENCOUNTER — Ambulatory Visit (HOSPITAL_BASED_OUTPATIENT_CLINIC_OR_DEPARTMENT_OTHER): Admitting: Student in an Organized Health Care Education/Training Program

## 2023-11-22 VITALS — BP 164/80 | HR 76 | Temp 97.4°F | Resp 13 | Ht 60.0 in | Wt 146.0 lb

## 2023-11-22 DIAGNOSIS — Z006 Encounter for examination for normal comparison and control in clinical research program: Secondary | ICD-10-CM

## 2023-11-22 DIAGNOSIS — M48062 Spinal stenosis, lumbar region with neurogenic claudication: Secondary | ICD-10-CM | POA: Diagnosis present

## 2023-11-22 DIAGNOSIS — G894 Chronic pain syndrome: Secondary | ICD-10-CM | POA: Diagnosis present

## 2023-11-22 MED ORDER — FENTANYL CITRATE (PF) 100 MCG/2ML IJ SOLN
25.0000 ug | INTRAMUSCULAR | Status: DC | PRN
  Administered 2023-11-22: 100 ug via INTRAVENOUS

## 2023-11-22 MED ORDER — ROPIVACAINE HCL 2 MG/ML IJ SOLN
INTRAMUSCULAR | Status: AC
  Filled 2023-11-22: qty 20

## 2023-11-22 MED ORDER — LIDOCAINE HCL 2 % IJ SOLN
INTRAMUSCULAR | Status: AC
  Filled 2023-11-22: qty 20

## 2023-11-22 MED ORDER — CEFAZOLIN SODIUM 1 G IJ SOLR
INTRAMUSCULAR | Status: AC
  Filled 2023-11-22: qty 20

## 2023-11-22 MED ORDER — CEFAZOLIN SODIUM-DEXTROSE 2-4 GM/100ML-% IV SOLN
2.0000 g | Freq: Once | INTRAVENOUS | Status: AC
  Administered 2023-11-22: 2 g via INTRAVENOUS

## 2023-11-22 MED ORDER — FENTANYL CITRATE (PF) 100 MCG/2ML IJ SOLN
INTRAMUSCULAR | Status: AC
  Filled 2023-11-22: qty 2

## 2023-11-22 MED ORDER — MIDAZOLAM HCL 5 MG/5ML IJ SOLN
0.5000 mg | Freq: Once | INTRAMUSCULAR | Status: AC
  Administered 2023-11-22: 1 mg via INTRAVENOUS

## 2023-11-22 MED ORDER — HYDROCODONE-ACETAMINOPHEN 5-325 MG PO TABS: 1.0000 | ORAL_TABLET | Freq: Three times a day (TID) | ORAL | 0 refills | Status: AC | PRN

## 2023-11-22 MED ORDER — ROPIVACAINE HCL 2 MG/ML IJ SOLN
9.0000 mL | Freq: Once | INTRAMUSCULAR | Status: AC
  Administered 2023-11-22: 9 mL via PERINEURAL

## 2023-11-22 MED ORDER — LIDOCAINE HCL 2 % IJ SOLN
20.0000 mL | Freq: Once | INTRAMUSCULAR | Status: AC
  Administered 2023-11-22: 400 mg

## 2023-11-22 MED ORDER — MIDAZOLAM HCL 5 MG/5ML IJ SOLN
INTRAMUSCULAR | Status: AC
Start: 2023-11-22 — End: 2023-11-22
  Filled 2023-11-22: qty 5

## 2023-11-22 NOTE — Patient Instructions (Addendum)
 ______________________________________________________________________    Post-Procedure Discharge Instructions  Instructions: Apply ice:  Purpose: This will minimize any swelling and discomfort after procedure.  When: Day of procedure, as soon as you get home. How: Fill a plastic sandwich bag with crushed ice. Cover it with a small towel and apply to injection site. How long: (15 min on, 15 min off) Apply for 15 minutes then remove x 15 minutes.  Repeat sequence on day of procedure, until you go to bed. Apply heat:  Purpose: To treat any soreness and discomfort from the procedure. When: Starting the next day after the procedure. How: Apply heat to procedure site starting the day following the procedure. How long: May continue to repeat daily, until discomfort goes away. Food intake: Start with clear liquids (like water) and advance to regular food, as tolerated.  Physical activities: Keep activities to a minimum for the first 8 hours after the procedure. After that, then as tolerated. Driving: If you have received any sedation, be responsible and do not drive. You are not allowed to drive for 24 hours after having sedation. Blood thinner: (Applies only to those taking blood thinners) You may restart your blood thinner 6 hours after your procedure. Insulin: (Applies only to Diabetic patients taking insulin) As soon as you can eat, you may resume your normal dosing schedule. Infection prevention: Keep procedure site clean and dry. Shower daily and clean area with soap and water. Post-procedure Pain Diary: Extremely important that this be done correctly and accurately. Recorded information will be used to determine the next step in treatment. For the purpose of accuracy, follow these rules: Evaluate only the area treated. Do not report or include pain from an untreated area. For the purpose of this evaluation, ignore all other areas of pain, except for the treated area. After your procedure,  avoid taking a long nap and attempting to complete the pain diary after you wake up. Instead, set your alarm clock to go off every hour, on the hour, for the initial 8 hours after the procedure. Document the duration of the numbing medicine, and the relief you are getting from it. Do not go to sleep and attempt to complete it later. It will not be accurate. If you received sedation, it is likely that you were given a medication that may cause amnesia. Because of this, completing the diary at a later time may cause the information to be inaccurate. This information is needed to plan your care. Follow-up appointment: Keep your post-procedure follow-up evaluation appointment after the procedure (usually 2 weeks for most procedures, 6 weeks for radiofrequencies). DO NOT FORGET to bring you pain diary with you.   Expect: (What should I expect to see with my procedure?) From numbing medicine (AKA: Local Anesthetics): Numbness or decrease in pain. You may also experience some weakness, which if present, could last for the duration of the local anesthetic. Onset: Full effect within 15 minutes of injected. Duration: It will depend on the type of local anesthetic used. On the average, 1 to 8 hours.  From steroids (Applies only if steroids were used): Decrease in swelling or inflammation. Once inflammation is improved, relief of the pain will follow. Onset of benefits: Depends on the amount of swelling present. The more swelling, the longer it will take for the benefits to be seen. In some cases, up to 10 days. Duration: Steroids will stay in the system x 2 weeks. Duration of benefits will depend on multiple posibilities including persistent irritating factors. Side-effects: If  present, they may typically last 2 weeks (the duration of the steroids). Frequent: Cramps (if they occur, drink Gatorade and take over-the-counter Magnesium  450-500 mg once to twice a day); water retention with temporary weight gain;  increases in blood sugar; decreased immune system response; increased appetite. Occasional: Facial flushing (red, warm cheeks); mood swings; menstrual changes. Uncommon: Long-term decrease or suppression of natural hormones; bone thinning. (These are more common with higher doses or more frequent use. This is why we prefer that our patients avoid having any injection therapies in other practices.)  Very Rare: Severe mood changes; psychosis; aseptic necrosis. From procedure: Some discomfort is to be expected once the numbing medicine wears off. This should be minimal if ice and heat are applied as instructed.  Call if: (When should I call?) You experience numbness and weakness that gets worse with time, as opposed to wearing off. New onset bowel or bladder incontinence. (Applies only to procedures done in the spine)  Emergency Numbers: Durning business hours (Monday - Thursday, 8:00 AM - 4:00 PM) (Friday, 9:00 AM - 12:00 Noon): [phone] After hours: [phone] NOTE: If you are having a problem and are unable connect with, or to talk to a provider, then go to your nearest urgent care or emergency department. If the problem is serious and urgent, please call 911. ______________________________________________________________________    Thank you for allowing us  to care for you today. 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 Below are important post-procedure care instructions to ensure a smooth recovery:  1. Incision Care Keep the incision site dry for the next 36 to 48 hours.  After 36-48 hours, you may shower. Avoid baths, hot tubs, or swimming pools for at least 7 days.  If your Tegaderm (clear bandage) or dressing falls off, simply replace it with a clean Band-Aid.  Monitor the site for signs of infection: increased redness, warmth, swelling, drainage, or fever. Please notify us  if you observe any of these  symptoms.  2. Activity Limit strenuous activity for the first few days. Light walking is encouraged to help with circulation and healing.  Avoid heavy lifting, bending, or twisting for at least 3-5 days post-procedure.  Gradually resume normal activities as tolerated, based on how you feel.  3. Pain Management You may experience some soreness or bruising at the incision site for a few days--this is normal.  Use ice packs intermittently (20 minutes on, 20 minutes off) over the area if needed for localized discomfort.  Take pain medications as directed by your provider.  4. Follow-Up Please attend your scheduled follow-up appointment to assess your recovery and progress.  Let us  know if you experience new or worsening leg weakness, loss of bladder or bowel control, or severe back pain--these are not expected and should be evaluated immediately.  If you have any questions or concerns, please do not hesitate to contact our office.  Wishing you a smooth and steady recovery. - Dr. Marcelino & Team

## 2023-11-22 NOTE — Progress Notes (Signed)
 Safety precautions to be maintained throughout the outpatient stay will include: orient to surroundings, keep bed in low position, maintain call bell within reach at all times, provide assistance with transfer out of bed and ambulation.

## 2023-11-22 NOTE — Progress Notes (Signed)
 PROVIDER NOTE: Interpretation of information contained herein should be left to medically-trained personnel. Specific patient instructions are provided elsewhere under Patient Instructions section of medical record. This document was created in part using STT-dictation technology, any transcriptional errors that may result from this process are unintentional.  Patient: Eileen Maldonado Type: Established DOB: Aug 28, 1945 MRN: [id number] PCP: Valora Agent, MD  Service: Procedure DOS: 11/22/2023 Setting: Ambulatory Location: Ambulatory outpatient facility Delivery: Face-to-face Provider: Wallie Sherry, MD Specialty: Interventional Pain Management Specialty designation: 09 Location: Outpatient facility Ref. Prov.: Sherry Wallie, MD       Interventional Therapy   Primary Reason for Admission: Surgical management of chronic pain condition.   Procedure:              Type: MILD: minimally invasive lumbar decompression at L4-5 Imaging: Fluoroscopic guidance Anesthesia: Local anesthesia (1-2% Lidocaine ) Sedation: Minimal Sedation                       DOS: 11/22/2023  Performed by: Wallie Sherry, MD  Indications: Spinal stenosis with neurogenic claudication severe enough to impact quality of life or function. Rationale (medical necessity): procedure needed and proper for the diagnosis and/or treatment of Ms. Montanari's medical symptoms and needs. 1. Chronic pain syndrome   2. Spinal stenosis, lumbar region, with neurogenic claudication    NAS-11 Pain score:   Pre-procedure: 6 /10   Post-procedure: 0-No pain/10      Type of procedure: Surgical   Position / Prep / Materials:  Position: Prone  Prep solution: ChloraPrep (2% chlorhexidine  gluconate and 70% isopropyl alcohol) Prep Area: Entire  Posterior  Lumbosacral  Region  Materials:  Tray: MILD kit  H&P (Pre-op Assessment):  Eileen Maldonado is a 78 y.o. (year old), female patient, seen today for interventional treatment. She  has a  past surgical history that includes appy; breast biopsy; hernia repair; Hernia repair; Knee Arthroplasty (Left, 07/06/2016); Breast surgery (Left); Appendectomy; Hysteroscopy with D & C (N/A, 12/22/2017); and Colonoscopy (N/A, 04/13/2022).  Initial Vital Signs:  Pulse/EKG Rate: 76ECG Heart Rate: 62 (nsr) Temp: (!) 97.3 F (36.3 C) Resp: 17 BP: (!) 151/99 SpO2: 100 %  BMI: Estimated body mass index is 28.51 kg/m as calculated from the following:   Height as of this encounter: 5' (1.524 m).   Weight as of this encounter: 146 lb (66.2 kg).  Risk Assessment: Allergies: Reviewed. She is allergic to amlodipine , lisinopril, celebrex [celecoxib], methotrexate derivatives, bupropion, doxycycline, etanercept, and latex.  Allergy Precautions: None required Coagulopathies: Reviewed. None identified.  Blood-thinner therapy: None at this time Active Infection(s): Reviewed. None identified. Eileen Maldonado is afebrile  Site Confirmation: Eileen Maldonado was asked to confirm the procedure and laterality before marking the site, which she did. Procedure checklist: Completed Consent: Before the procedure and under the influence of no sedative(s), amnesic(s), or anxiolytics, the patient was informed of the treatment options, risks and possible complications. To fulfill our ethical and legal obligations, as recommended by the American Medical Association's Code of Ethics, I have informed the patient of my clinical impression; the nature and purpose of the treatment or procedure; the risks, benefits, and possible complications of the intervention; the alternatives, including doing nothing; the risk(s) and benefit(s) of the alternative treatment(s) or procedure(s); and the risk(s) and benefit(s) of doing nothing.  Eileen Maldonado was provided with information about the general risks and possible complications associated with most interventional procedures. These include, but are not limited to: failure to achieve desired goals,  infection, bleeding,  organ or nerve damage, allergic reactions, paralysis, and/or death.  In addition, she was informed of those risks and possible complications associated to this particular procedure, which include, but are not limited to: damage to the implant; failure to decrease pain; local, systemic, or serious CNS infections, intraspinal abscess with possible cord compression and paralysis, or life-threatening such as meningitis; intrathecal and/or epidural bleeding with formation of hematoma with possible spinal cord compression and permanent paralysis; organ damage; nerve injury or damage with subsequent sensory, motor, and/or autonomic system dysfunction, resulting in transient or permanent pain, numbness, and/or weakness of one or several areas of the body; allergic reactions, either minor or major life-threatening, such as anaphylactic or anaphylactoid reactions.  Furthermore, [name] was informed of those risks and complications associated with the medications. These include, but are not limited to: allergic reactions (i.e.: anaphylactic or anaphylactoid reactions); arrhythmia;  Hypotension/hypertension; cardiovascular collapse; respiratory depression and/or shortness of breath; swelling or edema; medication-induced neural toxicity; particulate matter embolism and blood vessel occlusion with resultant organ, and/or nervous system infarction and permanent paralysis.  Finally, she was informed that Medicine is not an exact science; therefore, there is also the possibility of unforeseen or unpredictable risks and/or possible complications that may result in a catastrophic outcome. The patient indicated having understood very clearly. We have given the patient no guarantees and we have made no promises. Enough time was given to the patient to ask questions, all of which were answered to the patient's satisfaction. Eileen Maldonado has indicated that she wanted to continue with the  procedure. Attestation: I, the ordering provider, attest that I have discussed with the patient the benefits, risks, side-effects, alternatives, likelihood of achieving goals, and potential problems during recovery for the procedure that I have provided informed consent. Date  Time: 11/22/2023  9:53 AM  Pre-Procedure Preparation:  Monitoring: As per clinic protocol. Respiration, ETCO2, SpO2, BP, heart rate and rhythm monitor placed and checked for adequate function Safety Precautions: Patient was assessed for positional comfort and pressure points before starting the procedure. Time-out: I initiated and conducted the Time-out before starting the procedure, as per protocol. The patient was asked to participate by confirming the accuracy of the Time Out information. Verification of the correct person, site, and procedure were performed and confirmed by me, the nursing staff, and the patient. Time-out conducted as per Joint Commission's Universal Protocol (UP.01.01.01). Time: 1049 Start Time: 1049 hrs.    Description of Procedure #1:   INDICATIONS FOR PROCEDURE: The patient's identification was confirmed, and a time-out was performed. Time, site, location, and  procedure agreed upon and consented for were reviewed. The patient was radiologically confirmed to  be suffering from clinically symptomatic lumbar spinal stenosis and confirmed neurogenic claudication.  The patient's condition has progressed such that activities of daily living are limited due to pain intensity and neurogenic claudication symptomology. Standing time and walking distance have declined steadily.  Patient's clinical notes for office visits show tried and failed conservative treatments, such as physical  therapy, pharmaceutical management, or other treatments for chronic low back pain and neurogenic  claudication. According to the treatment algorithm for lumbar spinal stenosis, the patient is a candidate  for a direct  lumbar decompression procedure. The patient recalls our previous office discussion regarding the indications, risks, and potential benefits  of the mild Procedure--a minimally invasive lumbar decompression. Informed consent was obtained,  and the patient indicated understanding and agreed with the treatment proposed. PROCEDURE: The patient was placed in the prone position  in the fluoroscopy suite with a pillow (bolster) under the  hips to assist with reversing lordosis of the spine. Following IV sedation and antibiotic administration,  the patient was prepped and draped in my usual standard fashion. Ample amounts of local anesthetic were used to anesthetize the skin and deep facial planes after topographical landmarks were identified.  Procedural safety barriers were established  by utilizing a contralateral oblique (CLO) view to visualize the ventral interlaminar line (VILL) at L4/5.  Instruments remained posterior to the VILL during the percutaneous lumbar decompression procedure.  A small incision was made with a sharp scalpel blade, and a trocar with portal was inserted  percutaneously under fluoroscopic guidance to contact the lamina indicated at L4/5. A bone-sculpting rongeur was inserted to remove adequate amounts of lamina (laminotomy) from the superior surface of the inferior operative lamina site and from the inferior aspect of the lamina above it. The laminotomy  created a passage for the tissue sculpting instrument used in debulking the ligamentum flavum. The  ligamentum flavum was debulked until diminished returns at L4/5. This was done bilaterally.  There were no complications or difficulties noted with these procedures   Upon completion of the procedure, instruments were removed, hemostasis was achieved by direct  pressure, and wound closure was performed with appropriate dressings. The patient tolerated the  procedure well. Upon transferring the patient to the recovery  room, the patient's vital signs remained  stable and without any neurologic deficits. A brief neurological exam was performed and showed adequate strength and no loss of sensation in the lower extremities. The patient was discharged with  instructions to rest, continue with ice, and to demonstrate progressive mobility. The patient was given a  follow-up exam time and date along with instructions and contact information for any questions or  concerns.  The patient is to be followed up in the office for further evaluation and treatment, as deemed  appropriate and necessary, and for any concerns regarding the procedure.   CONCLUSION: A successful fluoroscopically guided, percutaneous L4/5  bilateral direct lumbar  decompression was undertaken at the indicated levels above with IV sedation and adequate local  anesthesia. The patient was neurologically intact without complications, freely able to move and  respond to commands with IV sedation, and able to move lower extremities. Movement of toes, feet,  knees, and hips were reviewed prior to being discharged from the procedure room and the recovery  room, as indicated. There was no new loss of sensation over the lower extremities.    Vitals:   11/22/23 1141 11/22/23 1144 11/22/23 1154 11/22/23 1203  BP: (!) 148/83 (!) 166/86 (!) 161/72 (!) 149/90  Pulse:      Resp: 18 17 15 16   Temp:      SpO2: 100% 100% 96% 96%  Weight:      Height:        Start Time: 1049 hrs. End Time: 1141 hrs.   Imaging Guidance (Spinal):         Type of Imaging Technique: Fluoroscopy Guidance (Spinal) Indication(s): Fluoroscopy guidance for needle placement to enhance accuracy in procedures requiring precise needle localization for targeted delivery of medication in or near specific anatomical locations not easily accessible without such real-time imaging assistance. Exposure Time: Please see nurses notes. Contrast: None used. Fluoroscopic Guidance: I was  personally present during the use of fluoroscopy. Tunnel Vision Technique used to obtain the best possible view of the target area. Parallax error corrected before commencing the procedure. Direction-depth-direction technique  used to introduce the needle under continuous pulsed fluoroscopy. Once target was reached, antero-posterior, oblique, and lateral fluoroscopic projection used confirm needle placement in all planes. Images permanently stored in EMR. Interpretation: No contrast injected. I personally interpreted the imaging intraoperatively. Adequate needle placement confirmed in multiple planes. Permanent images saved into the patient's record.  Antibiotic Prophylaxis:   Anti-infectives (From admission, onward)    Start     Dose/Rate Route Frequency Ordered Stop   11/22/23 1115  ceFAZolin  (ANCEF ) IVPB 2g/100 mL premix        2 g 200 mL/hr over 30 Minutes Intravenous  Once 11/22/23 1108 11/22/23 1100      Indication(s): Surgical Prophylaxis.  Post-operative Assessment:  Post-procedure Vital Signs:  Pulse/HCG Rate: 7670 Temp: (!) 97.3 F (36.3 C) Resp: 16 BP: (!) 149/90 SpO2: 96 %  Complications: No immediate post-treatment complications observed by team, or reported by patient.  Note: The patient tolerated the entire procedure well. A repeat set of vitals were taken after the procedure and the patient was kept under observation following institutional policy, for this type of procedure. Post-procedural neurological assessment was performed, showing return to baseline, prior to discharge. The patient was provided with post-procedure discharge instructions, including a section on how to identify potential problems. Should any problems arise concerning this procedure, the patient was given instructions to immediately contact us , at any time, without hesitation. In any case, we plan to contact the patient by telephone for a follow-up status report regarding this interventional  procedure.  Comments:  No additional relevant information.  Plan of Care Orders:  Orders Placed This Encounter  Procedures   DG PAIN CLIN[facility] C-ARM 1-60 MIN NO REPORT    Intraoperative interpretation by procedural physician at [facility].    Standing Status:   Standing    Number of Occurrences:   1    Reason for exam::   Assistance in needle guidance and placement for procedures requiring needle placement in or near specific anatomical locations not easily accessible without such assistance.    Medications administered: We administered lidocaine , midazolam , fentaNYL , ropivacaine  (PF) 2 mg/mL (0.2%), and ceFAZolin .  See the medical record for exact dosing, route, and time of administration.    L4-L5 ESI 12/13/2021, 08/15/22; B/L L3,4,5 MBNB 10/05/22, 04/17/23: both provided 80% pain relief for 5-7 days. B/L L3,4,5 RFA 06/14/23, MILD 11/22/23     Follow-up plan:   Return in about 3 weeks (around 12/13/2023) for PPE, F2F.     Recent Visits Date Type Provider Dept  11/02/23 Office Visit Marcelino Nurse, MD [facility]  Showing recent visits within past 90 days and meeting all other requirements Today's Visits Date Type Provider Dept  11/22/23 Procedure visit Marcelino Nurse, MD [facility]  Showing today's visits and meeting all other requirements Future Appointments Date Type Provider Dept  12/14/23 Appointment Marcelino Nurse, MD [facility]  Showing future appointments within next 90 days and meeting all other requirements  Disposition: Discharge home  Discharge (Date  Time): 11/22/2023; 1215 hrs.   Primary Care Physician: Valora Agent, MD Location: [facility] Note by: Nurse Marcelino, MD (TTS technology used. I apologize for any typographical errors that were not detected and corrected.) Date: 11/22/2023; Time: 12:13 PM

## 2023-11-23 ENCOUNTER — Telehealth: Payer: Self-pay | Admitting: *Deleted

## 2023-11-23 NOTE — Telephone Encounter (Signed)
 Called for post procedure follow-up. Message left.

## 2023-12-14 ENCOUNTER — Ambulatory Visit: Admitting: Student in an Organized Health Care Education/Training Program

## 2023-12-18 ENCOUNTER — Encounter: Payer: Self-pay | Admitting: Nurse Practitioner

## 2023-12-18 ENCOUNTER — Ambulatory Visit: Attending: Nurse Practitioner | Admitting: Nurse Practitioner

## 2023-12-18 VITALS — BP 153/95 | HR 82 | Temp 97.5°F | Resp 16 | Ht 62.0 in | Wt 149.0 lb

## 2023-12-18 DIAGNOSIS — M47816 Spondylosis without myelopathy or radiculopathy, lumbar region: Secondary | ICD-10-CM | POA: Diagnosis present

## 2023-12-18 DIAGNOSIS — M4726 Other spondylosis with radiculopathy, lumbar region: Secondary | ICD-10-CM | POA: Diagnosis not present

## 2023-12-18 DIAGNOSIS — G8929 Other chronic pain: Secondary | ICD-10-CM | POA: Diagnosis present

## 2023-12-18 DIAGNOSIS — M48062 Spinal stenosis, lumbar region with neurogenic claudication: Secondary | ICD-10-CM | POA: Insufficient documentation

## 2023-12-18 DIAGNOSIS — G894 Chronic pain syndrome: Secondary | ICD-10-CM | POA: Insufficient documentation

## 2023-12-18 DIAGNOSIS — M5416 Radiculopathy, lumbar region: Secondary | ICD-10-CM | POA: Diagnosis present

## 2023-12-18 NOTE — Progress Notes (Signed)
 PROVIDER NOTE: Interpretation of information contained herein should be left to medically-trained personnel. Specific patient instructions are provided elsewhere under Patient Instructions section of medical record. This document was created in part using AI and STT-dictation technology, any transcriptional errors that may result from this process are unintentional.  Patient: Eileen Maldonado  Service: E/M   PCP: Valora Agent, MD  DOB: 07/23/1945  DOS: 12/18/2023  Provider: Emmy MARLA Blanch, NP  MRN: [id number]  Delivery: Face-to-face  Specialty: Interventional Pain Management  Type: Established Patient  Setting: Ambulatory outpatient facility  Specialty designation: 09  Referring Prov.: Valora Agent, MD  Location: Outpatient office facility       History of present illness (HPI) Eileen Maldonado, a 78 y.o. 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 Ms. Barga's primary complain today is Back Pain (Lumbar bilateral, left is worse, starts at buttocks and goes down left leg to the ankle. )  Pertinent problems: Eileen Maldonado has Rheumatoid arthritis (HCC); S/P total knee arthroplasty; Chronic bilateral low back pain with bilateral sciatica; Spinal stenosis, lumbar region, with neurogenic claudication; Chronic radicular lumbar pain; and Lumbar spondylosis on their pertinent problem list.  Pain Assessment: Severity of Chronic pain is reported as a 5 /10. Location: Back Lower, Left, Right/left is worse, starts at the buttocks and goes down left side to the ankle.. Onset: More than a month ago. Quality: Aching, Other (Comment) (swelling in left foot.). Timing: Constant. Modifying factor(s): denies. Vitals:  height is 5' 2 (1.575 m) and weight is 149 lb (67.6 kg). Her temporal temperature is 97.5 F (36.4 C) (abnormal). Her blood pressure is 153/95 (abnormal) and her pulse is 82. Her respiration is 16 and oxygen saturation is  98%.  BMI: Estimated body mass index is 27.25 kg/m as calculated from the following:   Height as of this encounter: 5' 2 (1.575 m).   Weight as of this encounter: 149 lb (67.6 kg).  Last encounter: Visit date not found. Last procedure: 11/22/2023  Reason for encounter: post-procedure evaluation and assessment.   Eileen Maldonado underwent mild minimally invasive lumbar decompression at L4-L5 on November 22, 2023.  She reports 50% pain relief and functional improvement after decompression since the procedure.  She can walk and stand after procedure with pain level 5/10 then her base line pain level 8/10.  Procedure Type: MILD: minimally invasive lumbar decompression at L4-5 Imaging: Fluoroscopic guidance Anesthesia: Local anesthesia (1-2% Lidocaine ) Sedation: Minimal Sedation                       DOS: 11/22/2023  Performed by: Wallie Sherry, MD   Indications: Spinal stenosis with neurogenic claudication severe enough to impact quality of life or function. Rationale (medical necessity): procedure needed and proper for the diagnosis and/or treatment of [name] medical symptoms and needs. 1. Chronic pain syndrome   2. Spinal stenosis, lumbar region, with neurogenic claudication     NAS-11 Pain score:        Pre-procedure: 6 /10        Post-procedure: 0-No pain/10    Post-Procedure Evaluation   Effectiveness:  Initial hour after procedure: 100 % . Subsequent 4-6 hours post-procedure: 100 % . Analgesia past initial 6 hours: 50% Ongoing improvement:  Analgesic:  Eileen Maldonado underwent mild minimally invasive lumbar decompression at L4-L5 on November 22, 2023.  She reports 50% pain relief and functional improvement after decompression since the procedure.  She  can walk and stand after procedure with pain level 5/10. Function: Eileen Maldonado reports improvement in function ROM: Eileen Maldonado reports improvement in ROM  Pharmacotherapy Assessment   Monitoring: [place] PMP: PDMP not reviewed this  encounter.       Pharmacotherapy: No side-effects or adverse reactions reported. Compliance: No problems identified. Effectiveness: Clinically acceptable.  [name], RN  12/18/2023 10:22 AM  Sign when Signing Visit Safety precautions to be maintained throughout the outpatient stay will include: orient to surroundings, keep bed in low position, maintain call bell within reach at all times, provide assistance with transfer out of bed and ambulation.     UDS:  No results found for: SUMMARY  No results found for: CBDTHCR No results found for: D8THCCBX No results found for: D9THCCBX  ROS  Constitutional: Denies any fever or chills Gastrointestinal: No reported hemesis, hematochezia, vomiting, or acute GI distress Musculoskeletal: Denies any acute onset joint swelling, redness, loss of ROM, or weakness Neurological: No reported episodes of acute onset apraxia, aphasia, dysarthria, agnosia, amnesia, paralysis, loss of coordination, or loss of consciousness  Medication Review  Abatacept, Butenafine HCl, Dextran 70-Hypromellose, Fluticasone -Umeclidin-Vilant, Upadacitinib ER, Vitamin D3, albuterol, citalopram, conjugated estrogens , diclofenac, fluticasone , fluticasone  furoate-vilanterol, hydrochlorothiazide, leflunomide , loperamide, metoprolol  succinate, and pregabalin   History Review  Allergy: Eileen Maldonado is allergic to amlodipine , lisinopril, celebrex [celecoxib], methotrexate derivatives, bupropion, doxycycline, etanercept, and latex. Drug: Eileen Maldonado  reports no history of drug use. Alcohol:  reports no history of alcohol use. Tobacco:  reports that she has never smoked. She has never used smokeless tobacco. Social: Eileen Maldonado  reports that she has never smoked. She has never used smokeless tobacco. She reports that she does not drink alcohol and does not use drugs. Medical:  has a past medical history of Breast cancer (HCC), Collagen vascular disease (HCC), Hives,  Hypertension, ILD (interstitial lung disease) (HCC), Pneumonia, PVD (peripheral vascular disease) (HCC), RA (rheumatoid arthritis) (HCC), Raynaud's phenomenon, and Right thyroid  nodule. Surgical: Ms. Tomasso  has a past surgical history that includes appy; breast biopsy; hernia repair; Hernia repair; Knee Arthroplasty (Left, 07/06/2016); Breast surgery (Left); Appendectomy; Hysteroscopy with D & C (N/A, 12/22/2017); and Colonoscopy (N/A, 04/13/2022). Family: family history includes Breast cancer in her mother; Hypertension in her father and mother; Stroke in her father.  Laboratory Chemistry Profile   Renal Lab Results  Component Value Date   BUN 19 12/19/2017   CREATININE 1.06 (H) 12/19/2017   GFRAA 59 (L) 12/19/2017   GFRNONAA 51 (L) 12/19/2017    Hepatic Lab Results  Component Value Date   AST 25 06/22/2016   ALT 21 06/22/2016   ALBUMIN 4.1 06/22/2016   ALKPHOS 76 06/22/2016   LIPASE 22 01/31/2016    Electrolytes Lab Results  Component Value Date   NA 138 12/22/2017   K 3.3 (L) 12/22/2017   CL 104 12/19/2017   CALCIUM 9.1 12/19/2017    Bone No results found for: VD25OH, VD125OH2TOT, CI6874NY7, CI7874NY7, 25OHVITD1, 25OHVITD2, 25OHVITD3, TESTOFREE, TESTOSTERONE  Inflammation (CRP: Acute Phase) (ESR: Chronic Phase) Lab Results  Component Value Date   CRP <0.8 06/22/2016   ESRSEDRATE 11 06/22/2016   LATICACIDVEN 2.3 (HH) 01/31/2016         Note: Above Lab results reviewed.  Recent Imaging Review  DG PAIN CLINIC C-ARM 1-60 MIN NO REPORT Fluoro was used, but no Radiologist interpretation will be provided.  Please refer to NOTES tab for provider progress note. Note: Reviewed        Physical Exam  Vitals: BP (!) 153/95 (BP Location: Right Arm, Patient Position: Sitting, Cuff Size: Normal)   Pulse 82   Temp (!) 97.5 F (36.4 C) (Temporal)   Resp 16   Ht 5' 2 (1.575 m)   Wt 149 lb (67.6 kg)   SpO2 98%   BMI 27.25 kg/m  BMI: Estimated body mass  index is 27.25 kg/m as calculated from the following:   Height as of this encounter: 5' 2 (1.575 m).   Weight as of this encounter: 149 lb (67.6 kg). Ideal: Ideal body weight: 50.1 kg (110 lb 7.2 oz) Adjusted ideal body weight: 57.1 kg (125 lb 13.9 oz) General appearance: Well nourished, well developed, and well hydrated. In no apparent acute distress Mental status: Alert, oriented x 3 (person, place, & time)       Respiratory: No evidence of acute respiratory distress Eyes: PERLA   Assessment   Diagnosis Status  1. Spinal stenosis, lumbar region, with neurogenic claudication   2. Chronic pain syndrome   3. Chronic radicular lumbar pain   4. Lumbar spondylosis   5. Lumbar facet arthropathy    Controlled Controlled Controlled   Updated Problems: No problems updated.  Plan of Care  Problem-specific:  Assessment and Plan The patient reports improvement in symptoms following mild lumbar decompression at L4-L5.  No other new issues or problems reported at this visit however; she does not experience complete relief.  Advised her to monitor her symptoms over the next couple of months.  If the pain persist or does not improve, she should schedule a virtual evaluation with Dr. Marcelino or Face to face with Caid Radin.    Ms. Aury R Ishida has a current medication list which includes the following long-term medication(s): abatacept, albuterol, citalopram, fluticasone , hydrochlorothiazide, leflunomide , metoprolol  succinate, and pregabalin .  Pharmacotherapy (Medications Ordered): No orders of the defined types were placed in this encounter.  Orders:  No orders of the defined types were placed in this encounter.       Return in about 3 months (around 03/18/2024) for (VV), eval by MD.    Recent Visits Date Type Provider Dept  11/22/23 Procedure visit Marcelino Nurse, MD [facility]  11/02/23 Office Visit Marcelino Nurse, MD [facility]  Showing recent visits within  past 90 days and meeting all other requirements Today's Visits Date Type Provider Dept  12/18/23 Office Visit Kaelynn Igo K, NP [facility]  Showing today's visits and meeting all other requirements Future Appointments Date Type Provider Dept  02/13/24 Appointment Marcelino Nurse, MD [facility]  Showing future appointments within next 90 days and meeting all other requirements  I discussed the assessment and treatment plan with the patient. The patient was provided an opportunity to ask questions and all were answered. The patient agreed with the plan and demonstrated an understanding of the instructions.  Patient advised to call back or seek an in-person evaluation if the symptoms or condition worsens.  I personally spent a total of 20 minutes in the care of the patient today including preparing to see the patient, getting/reviewing separately obtained history, performing a medically appropriate exam/evaluation, counseling and educating, documenting clinical information in the EHR, independently interpreting results, and coordinating care.   Duration of encounter:  minutes.  Total time on encounter, as per AMA guidelines included both the face-to-face and non-face-to-face time personally spent by the physician and/or other qualified health care professional(s) on the day of the encounter (includes time in activities that require the physician  or other qualified health care professional and does not include time in activities normally performed by clinical staff). Physician's time may include the following activities when performed: Preparing to see the patient (e.g., pre-charting review of records, searching for previously ordered imaging, lab work, and nerve conduction tests) Review of prior analgesic pharmacotherapies. Reviewing PMP Interpreting ordered tests (e.g., lab work, imaging, nerve conduction tests) Performing post-procedure evaluations, including interpretation  of diagnostic procedures Obtaining and/or reviewing separately obtained history Performing a medically appropriate examination and/or evaluation Counseling and educating the patient/family/caregiver Ordering medications, tests, or procedures Referring and communicating with other health care professionals (when not separately reported) Documenting clinical information in the electronic or other health record Independently interpreting results (not separately reported) and communicating results to the patient/ family/caregiver Care coordination (not separately reported)  Note by: Yanci Bachtell K Jasmon Mattice, NP (TTS and AI technology used. I apologize for any typographical errors that were not detected and corrected.) Date: 12/18/2023; Time: 12:38 PM

## 2023-12-18 NOTE — Progress Notes (Signed)
 Safety precautions to be maintained throughout the outpatient stay will include: orient to surroundings, keep bed in low position, maintain call bell within reach at all times, provide assistance with transfer out of bed and ambulation.

## 2024-02-12 ENCOUNTER — Other Ambulatory Visit: Payer: Self-pay

## 2024-02-12 ENCOUNTER — Emergency Department

## 2024-02-12 ENCOUNTER — Emergency Department
Admission: EM | Admit: 2024-02-12 | Discharge: 2024-02-12 | Disposition: A | Discharge status: Home / Self Care | Attending: Emergency Medicine | Admitting: Emergency Medicine

## 2024-02-12 DIAGNOSIS — S0012XA Contusion of left eyelid and periocular area, initial encounter: Secondary | ICD-10-CM | POA: Insufficient documentation

## 2024-02-12 DIAGNOSIS — Y9301 Activity, walking, marching and hiking: Secondary | ICD-10-CM | POA: Insufficient documentation

## 2024-02-12 DIAGNOSIS — S0990XA Unspecified injury of head, initial encounter: Secondary | ICD-10-CM

## 2024-02-12 DIAGNOSIS — Z91040 Latex allergy status: Secondary | ICD-10-CM | POA: Insufficient documentation

## 2024-02-12 DIAGNOSIS — W109XXA Fall (on) (from) unspecified stairs and steps, initial encounter: Secondary | ICD-10-CM | POA: Insufficient documentation

## 2024-02-12 DIAGNOSIS — H05232 Hemorrhage of left orbit: Secondary | ICD-10-CM

## 2024-02-12 NOTE — ED Provider Notes (Signed)
 [place] EMERGENCY DEPARTMENT AT [facility] Provider Note   CSN: [id number] Arrival date & time: 02/12/24  1747     Patient presents with: Eileen Maldonado is a 78 y.o. female presents with family members for evaluation of a fall.  Patient suffered a mechanical fall around 4:30 PM today.  Patient states she tripped on a step, fell forward and hit her head.  She is not on any blood thinners.  She has a history of RA.  She is currently on diclofenac.  She has a subtle headache.  No nausea or vomiting.  No LOC.  No neck pain numbness tingling or radicular symptoms.  She has been ambulatory since the accident and denies any other injury to her body.      Prior to Admission medications   Medication Sig Start Date End Date Taking? Authorizing Provider  Abatacept (ORENCIA [place]) Inject 1 Dose into the skin once a week.    [provider]  albuterol (PROVENTIL HFA;VENTOLIN HFA) 108 (90 Base) MCG/ACT inhaler Inhale 2 puffs into the lungs every 6 (six) hours as needed for wheezing or shortness of breath.    [provider]  Butenafine HCl 1 % cream Apply 1 Application topically 2 (two) times daily. 12/16/21   [provider]  Cholecalciferol  (VITAMIN D3) 1000 units CAPS Take 1,000 Units by mouth daily.    [provider]  citalopram (CELEXA) 10 MG tablet Take 1 tablet by mouth daily. 04/28/20 12/18/23  [provider]  conjugated estrogens  (PREMARIN ) vaginal cream Place 1 applicator vaginally daily. 05/16/22   [provider]  Dextran 70-Hypromellose 0.1-0.3 % SOLN Place 1 drop into both eyes daily as needed for dry eyes.    [provider]  diclofenac (VOLTAREN) 50 MG EC tablet Take 50 mg by mouth 2 (two) times daily.    [provider]  fluticasone  (FLONASE ) 50 MCG/ACT nasal spray Place 1 spray into both nostrils daily as needed for allergies or rhinitis.     [provider]  fluticasone   furoate-vilanterol (BREO ELLIPTA) 100-25 MCG/ACT AEPB Inhale into the lungs. 12/09/20   [provider]  Fluticasone -Umeclidin-Vilant (TRELEGY ELLIPTA) 200-62.5-25 MCG/ACT AEPB Inhale 1 puff into the lungs daily. 11/30/22   [provider]  hydrochlorothiazide (HYDRODIURIL) 25 MG tablet Take 25 mg by mouth daily.    [provider]  leflunomide  (ARAVA ) 10 MG tablet Take 20 mg by mouth daily.    [provider]  loperamide (IMODIUM A-D) 2 MG tablet Take 2 mg by mouth 2 (two) times daily.    [provider]  metoprolol  succinate (TOPROL -XL) 25 MG 24 hr tablet Take 12.5 mg by mouth daily. 04/07/23 04/06/24  [provider]  pregabalin  (LYRICA ) 25 MG capsule Take 2 capsules (50 mg total) by mouth at bedtime. 03/17/22 12/18/23  Marcelino Nurse, MD  Upadacitinib ER (RINVOQ) 15 MG TB24 Take 15 mg by mouth daily.    [provider]    Allergies: Amlodipine , Lisinopril, Celebrex [celecoxib], Methotrexate and trimetrexate, Bupropion, Doxycycline, Etanercept, and Latex    Review of Systems  Updated Vital Signs BP (!) 183/94 (BP Location: Right Arm)   Pulse 74   Temp 98 F (36.7 C) (Oral)   Resp 18   Ht 5' 3 (1.6 m)   Wt 67.6 kg   SpO2 100%   BMI 26.39 kg/m   Physical Exam Constitutional:      Appearance: She is well-developed.  HENT:  Head: Normocephalic.     Comments: Periorbital hematoma left upper eyelid, forehead.  Superficial abrasion mild.  No laceration.  Normal EOM.  Pupils equal round and reactive to light bilaterally.  No spinous process tenderness along cervical spine.  Mild left and right paravertebral muscle tenderness.    Right Ear: Ear canal and external ear normal.     Left Ear: Ear canal and external ear normal.     Nose: Nose normal.     Mouth/Throat:     Pharynx: Oropharynx is clear.  Eyes:     Extraocular Movements: Extraocular movements intact.     Conjunctiva/sclera: Conjunctivae normal.     Pupils: Pupils  are equal, round, and reactive to light.  Cardiovascular:     Rate and Rhythm: Normal rate.  Pulmonary:     Effort: Pulmonary effort is normal. No respiratory distress.  Abdominal:     General: Abdomen is flat. There is no distension.     Palpations: Abdomen is soft.     Tenderness: There is no abdominal tenderness. There is no guarding.  Musculoskeletal:        General: Normal range of motion.     Cervical back: Normal range of motion.  Skin:    General: Skin is warm.     Findings: No rash.  Neurological:     General: No focal deficit present.     Mental Status: She is alert and oriented to person, place, and time. Mental status is at baseline.     Cranial Nerves: No cranial nerve deficit.     Motor: No weakness.     Gait: Gait normal.  Psychiatric:        Behavior: Behavior normal.        Thought Content: Thought content normal.     (all labs ordered are listed, but only abnormal results are displayed) Labs Reviewed - No data to display  EKG: None  Radiology: CT Head Wo Contrast Result Date: 02/12/2024 EXAM: CT HEAD AND CERVICAL SPINE 02/12/2024 06:24:03 PM TECHNIQUE: CT of the head and cervical spine was performed without the administration of intravenous contrast. Multiplanar reformatted images are provided for review. Automated exposure control, iterative reconstruction, and/or weight based adjustment of the mA/kV was utilized to reduce the radiation dose to as low as reasonably achievable. COMPARISON: None available. CLINICAL HISTORY: fall fall FINDINGS: CT HEAD BRAIN AND VENTRICLES: No acute intracranial hemorrhage. No mass effect or midline shift. No abnormal extra-axial fluid collection. No evidence of acute infarct. No hydrocephalus. ORBITS: No acute abnormality. SINUSES AND MASTOIDS: No acute abnormality. SOFT TISSUES AND SKULL: No acute skull fracture. Left forehead contusion. CT CERVICAL SPINE BONES AND ALIGNMENT: No acute fracture or traumatic malalignment.  DEGENERATIVE CHANGES: Moderate to severe degenerative disc disease and facet/uncovertebral hypertrophy with varying degrees of neural foraminal stenosis. SOFT TISSUES: No prevertebral soft tissue swelling. IMPRESSION: 1. No acute intracranial abnormality. 2. No acute fracture or traumatic malalignment of the cervical spine. 3. Left forehead contusion. Electronically signed by: Gilmore Molt MD 02/12/2024 07:09 PM EST RP Workstation: [id number]   CT Cervical Spine Wo Contrast Result Date: 02/12/2024 EXAM: CT HEAD AND CERVICAL SPINE 02/12/2024 06:24:03 PM TECHNIQUE: CT of the head and cervical spine was performed without the administration of intravenous contrast. Multiplanar reformatted images are provided for review. Automated exposure control, iterative reconstruction, and/or weight based adjustment of the mA/kV was utilized to reduce the radiation dose to as low as reasonably achievable. COMPARISON: None available. CLINICAL HISTORY: fall fall FINDINGS: CT HEAD  BRAIN AND VENTRICLES: No acute intracranial hemorrhage. No mass effect or midline shift. No abnormal extra-axial fluid collection. No evidence of acute infarct. No hydrocephalus. ORBITS: No acute abnormality. SINUSES AND MASTOIDS: No acute abnormality. SOFT TISSUES AND SKULL: No acute skull fracture. Left forehead contusion. CT CERVICAL SPINE BONES AND ALIGNMENT: No acute fracture or traumatic malalignment. DEGENERATIVE CHANGES: Moderate to severe degenerative disc disease and facet/uncovertebral hypertrophy with varying degrees of neural foraminal stenosis. SOFT TISSUES: No prevertebral soft tissue swelling. IMPRESSION: 1. No acute intracranial abnormality. 2. No acute fracture or traumatic malalignment of the cervical spine. 3. Left forehead contusion. Electronically signed by: Gilmore Molt MD 02/12/2024 07:09 PM EST RP Workstation: [id number]     Procedures   Medications Ordered in the ED - No data to display                                   Medical Decision Making Amount and/or Complexity of Data Reviewed Radiology: ordered.   78 year old female with mechanical fall around 4:30 PM today.  Tripped, hitting her left forehead on the step.  No LOC, nausea or vomiting.  Pain focal to the area of swelling consistent with hematoma.  Normal EOM and no vision changes.  No superior orbital rim tenderness.  CT of the head and cervical spine negative.  She appears well with no neurological deficits.  Patient safe and ready for discharge home.  She is given strict return precautions and understands signs symptoms return to ER for.   Final diagnoses:  Periorbital hematoma of left eye  Injury of head, initial encounter    ED Discharge Orders     None          Charlene Debby JAYSON DEVONNA 02/12/24 2032    Dorothyann Drivers, MD 02/12/24 2228

## 2024-02-12 NOTE — Discharge Instructions (Addendum)
 Please apply ice 20 minutes every hour to the left forehead for 2 days.  Apply antibiotic ointment once daily.  You may shower and get abrasion sites wet.  Take Tylenol  1000 mg every 6-8 hours as needed for pain.

## 2024-02-12 NOTE — ED Triage Notes (Signed)
 Pt sts that she was walking her dog and missed stepped and fell and hit her head. Pt has a contusion to the left from of her forehead. Pt is not on blood thinners.

## 2024-02-13 ENCOUNTER — Ambulatory Visit: Admitting: Student in an Organized Health Care Education/Training Program
